# Patient Record
Sex: Female | Born: 1935
Health system: Southern US, Community
[De-identification: ages and names within clinical notes are randomized; demographics above are authoritative.]

## PROBLEM LIST (undated history)

## (undated) DIAGNOSIS — K219 Gastro-esophageal reflux disease without esophagitis: Secondary | ICD-10-CM

## (undated) DIAGNOSIS — R32 Unspecified urinary incontinence: Secondary | ICD-10-CM

## (undated) DIAGNOSIS — B029 Zoster without complications: Secondary | ICD-10-CM

## (undated) DIAGNOSIS — K589 Irritable bowel syndrome without diarrhea: Secondary | ICD-10-CM

## (undated) DIAGNOSIS — R42 Dizziness and giddiness: Secondary | ICD-10-CM

## (undated) DIAGNOSIS — N182 Chronic kidney disease, stage 2 (mild): Secondary | ICD-10-CM

## (undated) DIAGNOSIS — K5790 Diverticulosis of intestine, part unspecified, without perforation or abscess without bleeding: Secondary | ICD-10-CM

## (undated) DIAGNOSIS — R079 Chest pain, unspecified: Secondary | ICD-10-CM

## (undated) DIAGNOSIS — E039 Hypothyroidism, unspecified: Secondary | ICD-10-CM

## (undated) DIAGNOSIS — S2231XA Fracture of one rib, right side, initial encounter for closed fracture: Secondary | ICD-10-CM

## (undated) DIAGNOSIS — F419 Anxiety disorder, unspecified: Secondary | ICD-10-CM

## (undated) DIAGNOSIS — E782 Mixed hyperlipidemia: Secondary | ICD-10-CM

## (undated) DIAGNOSIS — IMO0002 Reserved for concepts with insufficient information to code with codable children: Secondary | ICD-10-CM

## (undated) DIAGNOSIS — G5760 Lesion of plantar nerve, unspecified lower limb: Secondary | ICD-10-CM

## (undated) DIAGNOSIS — M5416 Radiculopathy, lumbar region: Secondary | ICD-10-CM

## (undated) HISTORY — DX: Irritable bowel syndrome, unspecified: K58.9

## (undated) HISTORY — DX: Dizziness and giddiness: R42

## (undated) HISTORY — DX: Gastro-esophageal reflux disease without esophagitis: K21.9

## (undated) HISTORY — DX: Zoster without complications: B02.9

## (undated) HISTORY — PX: WISDOM TOOTH EXTRACTION: SHX21

## (undated) HISTORY — DX: Chronic kidney disease, stage 2 (mild): N18.2

## (undated) HISTORY — DX: Mixed hyperlipidemia: E78.2

## (undated) HISTORY — DX: Fracture of one rib, right side, initial encounter for closed fracture: S22.31XA

## (undated) HISTORY — PX: TONSILLECTOMY: SUR1361

## (undated) HISTORY — DX: Chest pain, unspecified: R07.9

## (undated) HISTORY — DX: Hypothyroidism, unspecified: E03.9

## (undated) HISTORY — DX: Anxiety disorder, unspecified: F41.9

## (undated) HISTORY — DX: Diverticulosis of intestine, part unspecified, without perforation or abscess without bleeding: K57.90

## (undated) HISTORY — DX: Radiculopathy, lumbar region: M54.16

## (undated) HISTORY — DX: Reserved for concepts with insufficient information to code with codable children: IMO0002

## (undated) HISTORY — DX: Lesion of plantar nerve, unspecified lower limb: G57.60

## (undated) HISTORY — DX: Unspecified urinary incontinence: R32

---
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
## 1972-06-25 HISTORY — PX: TUBAL LIGATION: SHX77

## 1992-06-25 HISTORY — PX: FOOT NEUROMA SURGERY: SHX646

## 1999-12-21 ENCOUNTER — Ambulatory Visit (HOSPITAL_COMMUNITY): Admission: RE | Admit: 1999-12-21 | Discharge: 1999-12-21 | Payer: Self-pay | Admitting: Gastroenterology

## 2001-05-15 ENCOUNTER — Other Ambulatory Visit: Admission: RE | Admit: 2001-05-15 | Discharge: 2001-05-15 | Payer: Self-pay | Admitting: Family Medicine

## 2003-06-26 HISTORY — PX: COLONOSCOPY: SHX174

## 2003-06-26 LAB — HM COLONOSCOPY

## 2003-07-08 ENCOUNTER — Other Ambulatory Visit: Admission: RE | Admit: 2003-07-08 | Discharge: 2003-07-08 | Payer: Self-pay | Admitting: Family Medicine

## 2005-08-27 ENCOUNTER — Other Ambulatory Visit: Admission: RE | Admit: 2005-08-27 | Discharge: 2005-08-27 | Payer: Self-pay | Admitting: Family Medicine

## 2009-11-21 ENCOUNTER — Emergency Department (HOSPITAL_COMMUNITY): Admission: EM | Admit: 2009-11-21 | Discharge: 2009-11-21 | Payer: Self-pay | Admitting: Emergency Medicine

## 2010-01-15 ENCOUNTER — Emergency Department (HOSPITAL_COMMUNITY): Admission: EM | Admit: 2010-01-15 | Discharge: 2010-01-15 | Payer: Self-pay | Admitting: Emergency Medicine

## 2010-01-22 ENCOUNTER — Emergency Department (HOSPITAL_COMMUNITY): Admission: EM | Admit: 2010-01-22 | Discharge: 2010-01-22 | Payer: Self-pay | Admitting: Family Medicine

## 2010-03-02 ENCOUNTER — Ambulatory Visit: Payer: Self-pay | Admitting: Gastroenterology

## 2010-03-02 DIAGNOSIS — K589 Irritable bowel syndrome without diarrhea: Secondary | ICD-10-CM | POA: Insufficient documentation

## 2010-06-25 HISTORY — PX: CATARACT EXTRACTION: SUR2

## 2010-07-25 NOTE — Assessment & Plan Note (Signed)
Summary: DIVERTICULOSIS/ ?RECURRENT DIVERTICULITIS, IBS-M   Visit Type:  Initial Consult Primary Care Provider:  Milinda Cave, M.D.  Chief Complaint:  diverticulitis.  History of Present Illness: Last episode of MAY 2011. Doesn't handle Flagyl-constipates her, severe nausea. Only took Flagyl for 4 days and took complete course of CIP. Went to ED JUL 2011and given CIP/FLAG.  Sx: constipation, pain in LLQ, bloating, can't pass gas, Tm 31F. TCS at [facility] one 2005. No nausea, vomiting, blood in stool, black stools, problems swallowing. 100% Sx relief with Nexium. Several episode of diarrhea this AM-crampy and rectal urgency.  Was seeing Dr. Gwinda Passe but wants to establish care closer to home.  Preventive Screening-Counseling & Management  Alcohol-Tobacco     Smoking Status: quit  Current Medications (verified): 1)  Aspirin 81 Mg Tbec (Aspirin) .... Once Daily 2)  Levoxyl 100 Mcg Tabs (Levothyroxine Sodium) .... Once Daily 3)  Lipitor 10 Mg Tabs (Atorvastatin Calcium) .... Once Daily 4)  Lorazepam 0.5 Mg Tabs (Lorazepam) .... Once Daily 5)  Nexium 40 Mg Cpdr (Esomeprazole Magnesium) .... Once Daily 6)  Detrol La 4 Mg Xr24h-Cap (Tolterodine Tartrate) .... Once Daily 7)  Amitiza 8 Mcg Caps (Lubiprostone) .... Two Times A Day 8)  Cipro 500 Mg Tabs (Ciprofloxacin Hcl) .... As Needed Divericulitis 9)  Meclizine Hcl 25 Mg Tabs (Meclizine Hcl) .... As Needed 10)  Promethazine Hcl 25 Mg Tabs (Promethazine Hcl) .... As Needed  Allergies (verified): No Known Drug Allergies  Past History:  Past Medical History: Diverticulosis: L COLON Reported recurrent Diverticulitis SINCE THE 80S ?-NO ABD IMAGING IBS-diarrhea/constipation GERD for several years **EGD 2008: SML HH, NERD, F50 V5 RECTAL BLEEDING/DIARRHEA **TCS 2005: MILD ulcerative proctitis, NORML RANDOM COLON BX, F75 V7 TCS 2001: poor prep in right colon, [place] DIVERTICULOSIS Hyperlipidemia Hypertension Hypothyroidism Vertigo 2010 Anxiety  Disorder  Past Surgical History: Tonsillectomy Tubal Ligation  Family History: No FH of Colon Cancer FATHER: ETOH ABUSE  Social History: Divorced, retired Engineer, 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 Quit 1985 Alcohol Use - no Smoking Status:  quit  Review of Systems       2005: 168 LBS  APR 2011: HB 14.4 PLT 255 CR 0.98 TBILI 0.6 ALK PHOS 97AST 18 ALT 14 ALB 4.3 TSH 0.430  Vital Signs:  Patient profile:   77 year old female Height:      66 inches Weight:      169 pounds BMI:     27.38 Temp:     97.9 degrees F oral Pulse rate:   84 / minute BP sitting:   130 / 80  (right arm) Cuff size:   regular  Vitals Entered By: Hendricks Limes LPN (March 02, 2010 9:06 AM)  Physical Exam  General:  Well developed, well nourished, no acute distress. Head:  Normocephalic and atraumatic. Eyes:  PERRL, no icterus. Mouth:  No deformity or lesions. Neck:  Supple; no masses. Lungs:  Clear throughout to auscultation. Heart:  Regular rate and rhythm; no murmurs. Abdomen:  Soft, mild TTP in BUQ/EPIGASTRIUM, nondistended.  Normal bowel sounds. Extremities:  No edema noted. Neurologic:  Alert and  oriented x4;  grossly normal neurologically.  Impression & Recommendations:  Problem # 1:  ABDOMINAL PAIN, LEFT LOWER QUADRANT (ICD-789.04) Assessment Unchanged Most likely 2o to functional abd pain or IBS-mixed pattern. No pain today. Needs imaging prior to next course of Abx. Call if you think you are having a bout of diverticulitis. Will get a CT Scan of your abdomen. Will use Augmentin to  treat next episode of diverticulitis.  Problem # 2:  IRRITABLE BOWEL SYNDROME (ICD-564.1) Continue Amitiza. Monitor stool freqency. Follow up in 6 mos. Will get colonoscopy and radiology reports from [facility].  CC: PCP  Patient Instructions: 1)  Call if you think you are having a bout of diverticulitis. 2)  Will get a CT Scan of you abdomen with next bout of pain. 3)  Will use  Augmentin to treat  next episode of diverticulitis. 4)  Continue Amitiza. 5)  Follow up in 6 mos. 6)  Will get colonoscopy and radiology rports from [facility]. 7)  The medication list was reviewed and reconciled.  All changed / newly prescribed medications were explained.  A complete medication list was provided to the patient / caregiver.  Appended Document: DIVERTICULOSIS/ ?RECURRENT DIVERTICULITIS, IBS-M 6 MONTH F/U OPV IS IN THE COMPUTER  Appended Document: Orders Update    Clinical Lists Changes  Orders: Added new Service order of New Patient Level IV (16109) - Signed

## 2010-08-16 ENCOUNTER — Encounter (INDEPENDENT_AMBULATORY_CARE_PROVIDER_SITE_OTHER): Payer: Self-pay | Admitting: *Deleted

## 2010-08-22 NOTE — Letter (Signed)
Summary: Recall Office Visit  [facility]  [address]   [place], [place] [address]   Phone: [phone]  Fax: [phone]      August 16, 2010   TAKIMA ENCINA [address] [place], [place]  [address] 10/28/1933   Dear Ms. Hillhouse,   According to our records, it is time for you to schedule a follow-up office visit with [place].   At your convenience, please call [phone] to schedule an office visit. If you have any questions, concerns, or feel that this letter is in error, we would appreciate your call.   Sincerely,    Diana Eves  [facility] Ph: [phone]   Fax: [phone]

## 2010-11-10 NOTE — Procedures (Signed)
[facility]  Patient:    Julie Park, Julie Park                     MRN: [id number] Proc. Date: 12/21/99 Adm. Date:  [id number] Disc. Date: [id number] Attending:  Deneen Harts CC:         Monica Becton, M.D.                           Procedure Report  PROCEDURE:  Colonoscopy.  INDICATION FOR PROCEDURE:  A 77 year old white female undergoing colonoscopy for neoplasia surveillance and to further evaluate recurrent episodes of acute diverticulitis. Last examined 4 years ago at which time, diverticular disease was noted during the interim 4 years, recurrent episodes requiring Cipro therapy.  DESCRIPTION OF PROCEDURE:  After reviewing the nature of the procedure with the patient including potential risks and complications, and after discussing alternative methods of diagnosis and treatment, informed consent was signed.  The patient was premedicated receiving IV sedation totaling Versed 10 mg, fentanyl 100 mcg administered IV in divided doses prior to and during the course of the procedure.  Using an Olympus pediatric PCF-140L video colonoscope, the rectum was intubated after a normal digital examination. The scope was inserted and advanced around the entire length of the colon to the cecum identified by the appendiceal orifice and ileocecal valve. Intubation was slightly difficult through a very diverticular laden sigmoid colon. Preparation was poor in the right colon, fair to good throughout the remainder.  The scope was slowly withdrawn with careful inspection of the entire colon in a retrograde manner including retroflexed view in the rectal vault.  The only abnormality noted was extensive sigmoid diverticulosis. No evidence of acute infection or inflammation. No neoplasia. Mucosa intact throughout. The colon was decompressed, scope withdrawn.  The patient tolerated the procedure without difficulty being maintained on DataScope monitor low  flow oxygen throughout.  Time 1, technical 1, preparation 3, total score equal 5.  ASSESSMENT: 1. Diverticulosis--extensive sigmoid involvement. No acute inflammation. 2. No colorectal neoplasia.  RECOMMENDATION: 1. Annual Hemoccult. 2. Repeat colonoscopy in 10 years. 3. Symptomatic treatment of diverticulitis when indicated. DD:  12/21/99 TD:  12/23/99 Job: [id number]

## 2012-02-24 LAB — HM PAP SMEAR

## 2012-02-24 LAB — HM MAMMOGRAPHY

## 2012-05-14 ENCOUNTER — Ambulatory Visit: Payer: Self-pay | Admitting: Family Medicine

## 2012-05-16 ENCOUNTER — Ambulatory Visit (INDEPENDENT_AMBULATORY_CARE_PROVIDER_SITE_OTHER): Payer: Medicare Other | Admitting: Family Medicine

## 2012-05-16 ENCOUNTER — Encounter: Payer: Self-pay | Admitting: Family Medicine

## 2012-05-16 VITALS — BP 133/85 | HR 91 | Temp 97.0°F | Ht 65.0 in | Wt 176.1 lb

## 2012-05-16 DIAGNOSIS — IMO0002 Reserved for concepts with insufficient information to code with codable children: Secondary | ICD-10-CM

## 2012-05-16 DIAGNOSIS — K589 Irritable bowel syndrome without diarrhea: Secondary | ICD-10-CM

## 2012-05-16 DIAGNOSIS — E782 Mixed hyperlipidemia: Secondary | ICD-10-CM | POA: Insufficient documentation

## 2012-05-16 DIAGNOSIS — E785 Hyperlipidemia, unspecified: Secondary | ICD-10-CM | POA: Insufficient documentation

## 2012-05-16 DIAGNOSIS — E039 Hypothyroidism, unspecified: Secondary | ICD-10-CM

## 2012-05-16 DIAGNOSIS — K5790 Diverticulosis of intestine, part unspecified, without perforation or abscess without bleeding: Secondary | ICD-10-CM

## 2012-05-16 DIAGNOSIS — L98499 Non-pressure chronic ulcer of skin of other sites with unspecified severity: Secondary | ICD-10-CM

## 2012-05-16 DIAGNOSIS — K219 Gastro-esophageal reflux disease without esophagitis: Secondary | ICD-10-CM | POA: Insufficient documentation

## 2012-05-16 DIAGNOSIS — K5730 Diverticulosis of large intestine without perforation or abscess without bleeding: Secondary | ICD-10-CM

## 2012-05-16 DIAGNOSIS — F419 Anxiety disorder, unspecified: Secondary | ICD-10-CM

## 2012-05-16 DIAGNOSIS — R32 Unspecified urinary incontinence: Secondary | ICD-10-CM

## 2012-05-16 DIAGNOSIS — F411 Generalized anxiety disorder: Secondary | ICD-10-CM

## 2012-05-16 MED ORDER — SOLIFENACIN SUCCINATE 5 MG PO TABS: 5.0000 mg | ORAL_TABLET | Freq: Every day | ORAL | Status: DC

## 2012-05-16 NOTE — Progress Notes (Signed)
Office Note 05/16/2012  CC:  Chief Complaint  Patient presents with  . Establish Care    new patient    HPI:  Julie Park is a 78 y.o. White female who is here to establish care. Patient's most recent primary MD: Dr. Milford Cage at [facility] in [place].  GYN: Dr. Mora Appl in [place]. Had pap and mammo last month and these were normal per her report today. Old records were not reviewed prior to or during today's visit.  No acute complaints.   She last saw Dr. Milford Cage for f/u a couple of months ago.  Has had some GI hx--diverticular disease-- she keeps cipro on hand in case she feels like diverticulitis is starting.  She hasn't had problem with this for a "long time" and says amitiza and metamucil help her bowel habits immensely. She does says he is interested in trying a different med for her urinary incontinence b/c ditropan not helping much and it is very expensive. She is not taking any calcium or vitamin D.  She reports having a bone density scan in the last 5 yrs or so and was told it was normal.    Past Medical History  Diagnosis Date  . Hyperlipidemia   . Diverticulitis 1982  . Vertigo   . Anxiety   . GERD (gastroesophageal reflux disease)   . Thyroid disease   . Urine incontinence   . Ulcer   . Shingles 1967 and 2010    Past Surgical History  Procedure Date  . Tubal ligation 1974  . Tonsillectomy and adenoidectomy 1948  . Foot neuroma surgery 1994    right foot  . Wisdom tooth extraction   . Colonoscopy 2005    Dr. Gwinda Passe at [facility] (normal per pt report)  . Cataract extraction 2012    Dr. Elmer Picker    Family History  Problem Relation Age of Onset  . Cancer Father     pancreatic  . Asthma Daughter   . Heart attack Paternal Grandfather     History   Social History  . Marital Status: Divorced    Spouse Name: N/A    Number of Children: N/A  . Years of Education: N/A   Occupational History  . Not on file.   Social History Main Topics  . Smoking status: Former  Smoker -- 1.0 packs/day for 8 years    Types: Cigarettes    Start date: 06/26/1983  . Smokeless tobacco: Never Used  . Alcohol Use: No  . Drug Use: No  . Sexually Active: Not Currently   Other Topics Concern  . 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  Alcohol: none.      Outpatient Encounter Prescriptions as of 05/16/2012  Medication Sig Dispense Refill  . aspirin 81 MG tablet Take 81 mg by mouth daily.      Marland Kitchen atorvastatin (LIPITOR) 10 MG tablet Take 10 mg by mouth daily.      Marland Kitchen esomeprazole (NEXIUM) 40 MG capsule Take 40 mg by mouth daily before breakfast.      . levothyroxine (SYNTHROID, LEVOTHROID) 88 MCG tablet Take 88 mcg by mouth daily.      Marland Kitchen LORazepam (ATIVAN) 1 MG tablet Take 1 mg by mouth every 8 (eight) hours.      Marland Kitchen lubiprostone (AMITIZA) 8 MCG capsule Take 8 mcg by mouth 2 (two) times  daily with a meal.      . tolterodine (DETROL LA) 4 MG 24 hr capsule Take 4 mg by mouth daily.      . ciprofloxacin (CIPRO) 500 MG tablet Take 500 mg by mouth as needed.      . meclizine (ANTIVERT) 25 MG tablet Take 25 mg by mouth as needed.      . promethazine (PHENERGAN) 25 MG tablet Take 25 mg by mouth as needed.      . solifenacin (VESICARE) 5 MG tablet Take 1 tablet (5 mg total) by mouth daily.  30 tablet  6    No Known Allergies  ROS Review of Systems  Constitutional: Negative for fever and fatigue.  HENT: Negative for congestion and sore throat.   Eyes: Negative for visual disturbance.  Respiratory: Negative for cough.   Cardiovascular: Negative for chest pain.  Gastrointestinal: Negative for nausea and abdominal pain.  Genitourinary: Negative for dysuria.  Musculoskeletal: Negative for back pain and joint swelling.  Skin: Negative for rash.  Neurological: Negative for weakness and  headaches.  Hematological: Negative for adenopathy.    PE; Blood pressure 133/85, pulse 91, temperature 97 F (36.1 C), temperature source Temporal, height 5\' 5"  (1.651 m), weight 176 lb 1.9 oz (79.888 kg), SpO2 95.00%. Gen: Alert, well appearing.  Patient is oriented to person, place, time, and situation. ENT: Eyes: no injection, icteris, swelling, or exudate.  EOMI, PERRLA. Nose: no drainage or turbinate edema/swelling.  No injection or focal lesion.  Mouth: lips without lesion/swelling.  Oral mucosa pink and moist.  Dentition intact and without obvious caries or gingival swelling.  Oropharynx without erythema, exudate, or swelling.  Neck - No masses or thyromegaly or limitation in range of motion CV: RRR, no m/r/g.   LUNGS: CTA bilat, nonlabored resps, good aeration in all lung fields. ABD: soft, NT, ND, BS normal.  No hepatospenomegaly or mass.  No bruits. EXT: no clubbing, cyanosis, or edema.   Pertinent labs:  None today  ASSESSMENT AND PLAN:   Transfer pt from [facility] in [place]: obtain old records.  Urine incontinence D/c ditropan. Start vesicare 5mg  qhs, increase to 10mg  qhs after 7-10d if not getting desired effect. Therapeutic expectations and side effect profile of medication discussed today.  Patient's questions answered.   Hypothyroidism Continue current synthroid dosing. Obtain recent labs via old records from PCP. Plan to recheck TSH at next f/u in 48mo.  Diverticular disease Quiescent as of late.  Anxiety Problem stable.  Continue current medications and diet appropriate for this condition.  We have reviewed our general long term plan for this problem and also reviewed symptoms and signs that should prompt the patient to call or return to the office.   IRRITABLE BOWEL SYNDROME Constipation predominant. Doing well on metamucil and amitiza.    Return in about 4 months (around 09/13/2012) for f/u urinary incont and hypothyr.

## 2012-05-16 NOTE — Assessment & Plan Note (Addendum)
D/c ditropan. Start vesicare 5mg  qhs, increase to 10mg  qhs after 7-10d if not getting desired effect. Therapeutic expectations and side effect profile of medication discussed today.  Patient's questions answered.

## 2012-05-16 NOTE — Assessment & Plan Note (Signed)
Problem stable.  Continue current medications and diet appropriate for this condition.  We have reviewed our general long term plan for this problem and also reviewed symptoms and signs that should prompt the patient to call or return to the office.  

## 2012-05-16 NOTE — Assessment & Plan Note (Signed)
Continue current synthroid dosing. Obtain recent labs via old records from PCP. Plan to recheck TSH at next f/u in 55mo.

## 2012-05-16 NOTE — Assessment & Plan Note (Signed)
Quiescent as of late.

## 2012-05-16 NOTE — Assessment & Plan Note (Signed)
>>  ASSESSMENT AND PLAN FOR ANXIETY WRITTEN ON 05/16/2012  5:22 PM BY MCGOWEN, PHILIP H, MD  Problem stable.  Continue current medications and diet appropriate for this condition.  We have reviewed our general long term plan for this problem and also reviewed symptoms and signs that should prompt the patient to call or return to the office.

## 2012-05-16 NOTE — Assessment & Plan Note (Signed)
Constipation predominant. Doing well on metamucil and amitiza.

## 2012-09-18 ENCOUNTER — Encounter: Payer: Self-pay | Admitting: Family Medicine

## 2012-09-18 ENCOUNTER — Ambulatory Visit (INDEPENDENT_AMBULATORY_CARE_PROVIDER_SITE_OTHER): Payer: Medicare Other | Admitting: Family Medicine

## 2012-09-18 VITALS — BP 146/74 | HR 87 | Temp 97.8°F | Resp 14 | Wt 177.5 lb

## 2012-09-18 DIAGNOSIS — E782 Mixed hyperlipidemia: Secondary | ICD-10-CM

## 2012-09-18 DIAGNOSIS — E039 Hypothyroidism, unspecified: Secondary | ICD-10-CM

## 2012-09-18 DIAGNOSIS — R32 Unspecified urinary incontinence: Secondary | ICD-10-CM

## 2012-09-18 DIAGNOSIS — E785 Hyperlipidemia, unspecified: Secondary | ICD-10-CM

## 2012-09-18 LAB — COMPREHENSIVE METABOLIC PANEL
ALT: 18 U/L (ref 0–35)
CO2: 28 mEq/L (ref 19–32)
Calcium: 8.9 mg/dL (ref 8.4–10.5)
Chloride: 100 mEq/L (ref 96–112)
Creatinine, Ser: 1 mg/dL (ref 0.4–1.2)
GFR: 59.89 mL/min — ABNORMAL LOW (ref >60.00)
Glucose, Bld: 96 mg/dL (ref 70–99)
Total Bilirubin: 0.6 mg/dL (ref 0.3–1.2)
Total Protein: 6.7 g/dL (ref 6.0–8.3)

## 2012-09-18 LAB — LIPID PANEL
HDL: 43.6 mg/dL (ref >39.00)
Total CHOL/HDL Ratio: 3
Triglycerides: 89 mg/dL (ref 0.0–149.0)

## 2012-09-18 LAB — TSH: TSH: 1.96 u[IU]/mL (ref 0.35–5.50)

## 2012-09-18 MED ORDER — LORAZEPAM 1 MG PO TABS: 1.0000 mg | ORAL_TABLET | Freq: Three times a day (TID) | ORAL | Status: DC

## 2012-09-18 MED ORDER — ZOSTER VACCINE LIVE 19400 UNT/0.65ML ~~LOC~~ SOLR: 0.6500 mL | Freq: Once | SUBCUTANEOUS | Status: DC

## 2012-09-18 NOTE — Progress Notes (Signed)
OFFICE NOTE  09/18/2012  CC:  Chief Complaint  Patient presents with  . Follow-up    4 months     HPI: Patient is a 79 y.o. Caucasian female who is here for routine 6mo f/u hypoth, hyperlipidemia, urge incont. Feeling well.  No acute complaints.  Compliant with all meds--has just switched over to vesicare from [facility] and says it is doing fine so far.  Asks for rf on her lorazepam, takes 1 bid.   ROS: no CP, no SOB, no palpitations, no tremor, no fatigue.  No dysuria or hematuria.  No dizziness.  Pertinent PMH:  Past Medical History  Diagnosis Date  . Hyperlipidemia, mixed   . Diverticulosis     Diverticulitis (1982)  . Vertigo   . Anxiety   . GERD (gastroesophageal reflux disease)   . Hypothyroidism   . Urine incontinence   . Ulcer   . Shingles 1967 and 2010  . IBS (irritable bowel syndrome)     MEDS:  Outpatient Prescriptions Prior to Visit  Medication Sig Dispense Refill  . aspirin 81 MG tablet Take 81 mg by mouth daily.      Marland Kitchen atorvastatin (LIPITOR) 10 MG tablet Take 10 mg by mouth daily.      . ciprofloxacin (CIPRO) 500 MG tablet Take 500 mg by mouth as needed.      Marland Kitchen esomeprazole (NEXIUM) 40 MG capsule Take 40 mg by mouth daily before breakfast.      . levothyroxine (SYNTHROID, LEVOTHROID) 88 MCG tablet Take 88 mcg by mouth daily.      Marland Kitchen lubiprostone (AMITIZA) 8 MCG capsule Take 8 mcg by mouth 2 (two) times daily with a meal.      . meclizine (ANTIVERT) 25 MG tablet Take 25 mg by mouth as needed.      . promethazine (PHENERGAN) 25 MG tablet Take 25 mg by mouth as needed.      . solifenacin (VESICARE) 5 MG tablet Take 1 tablet (5 mg total) by mouth daily.  30 tablet  6  . LORazepam (ATIVAN) 1 MG tablet Take 1 mg by mouth every 8 (eight) hours.      Marland Kitchen tolterodine (DETROL LA) 4 MG 24 hr capsule Take 4 mg by mouth daily.       No facility-administered medications prior to visit.    PE: Blood pressure 146/74, pulse 87, temperature 97.8 F (36.6 C), temperature  source Temporal, resp. rate 14, weight 177 lb 8 oz (80.513 kg), SpO2 96.00%.  bP repeat in exam room 136/76 Gen: Alert, well appearing.  Patient is oriented to person, place, time, and situation. ENT: Ears: EACs clear, normal epithelium.  TMs with good light reflex and landmarks bilaterally.  Eyes: no injection, icteris, swelling, or exudate.  EOMI, PERRLA. Nose: no drainage or turbinate edema/swelling.  No injection or focal lesion.  Mouth: lips without lesion/swelling.  Oral mucosa pink and moist.  Dentition intact and without obvious caries or gingival swelling.  Oropharynx without erythema, exudate, or swelling.  Neck - No masses or thyromegaly or limitation in range of motion CV: RRR, no m/r/g.   LUNGS: CTA bilat, nonlabored resps, good aeration in all lung fields.   IMPRESSION AND PLAN:  Hyperlipidemia Continue statin. Check FLP and transaminases today.  Hypothyroidism Continue 88 mcg levothyroxine qd. Check TSH today.  Urine incontinence Stable. Continue vesicare--rx given today.   An After Visit Summary was printed and given to the patient.  FOLLOW UP:  4-6 mo routine f/u

## 2012-09-20 NOTE — Assessment & Plan Note (Signed)
Continue 88 mcg levothyroxine qd. Check TSH today.

## 2012-09-20 NOTE — Assessment & Plan Note (Signed)
Stable. Continue vesicare--rx given today.

## 2012-09-20 NOTE — Assessment & Plan Note (Signed)
Continue statin. Check FLP and transaminases today.

## 2013-01-28 ENCOUNTER — Other Ambulatory Visit: Payer: Self-pay

## 2013-03-02 ENCOUNTER — Telehealth: Payer: Self-pay | Admitting: Family Medicine

## 2013-03-02 MED ORDER — ESOMEPRAZOLE MAGNESIUM 40 MG PO CPDR: 40.0000 mg | DELAYED_RELEASE_CAPSULE | Freq: Every day | ORAL | Status: DC

## 2013-03-02 MED ORDER — LORAZEPAM 1 MG PO TABS: 1.0000 mg | ORAL_TABLET | Freq: Three times a day (TID) | ORAL | Status: DC

## 2013-03-02 NOTE — Telephone Encounter (Signed)
Patient requesting ativan refill.  Patient last seen 09/18/12 and got rx x 5 refills.  Patient next OV is 03/20/13.  Please advise.

## 2013-03-02 NOTE — Telephone Encounter (Signed)
OK to RF as previously prescribed, with 5 additional RF's.  She has scheduled f/u this month and is a reliable/compliant patient.-thx

## 2013-03-20 ENCOUNTER — Encounter: Payer: Self-pay | Admitting: Family Medicine

## 2013-03-20 ENCOUNTER — Ambulatory Visit (INDEPENDENT_AMBULATORY_CARE_PROVIDER_SITE_OTHER): Payer: Medicare Other | Admitting: Family Medicine

## 2013-03-20 VITALS — BP 122/68 | HR 95 | Temp 97.8°F | Resp 16 | Ht 65.0 in | Wt 175.0 lb

## 2013-03-20 DIAGNOSIS — F419 Anxiety disorder, unspecified: Secondary | ICD-10-CM

## 2013-03-20 DIAGNOSIS — Z23 Encounter for immunization: Secondary | ICD-10-CM

## 2013-03-20 DIAGNOSIS — K5730 Diverticulosis of large intestine without perforation or abscess without bleeding: Secondary | ICD-10-CM

## 2013-03-20 DIAGNOSIS — R32 Unspecified urinary incontinence: Secondary | ICD-10-CM

## 2013-03-20 DIAGNOSIS — K5790 Diverticulosis of intestine, part unspecified, without perforation or abscess without bleeding: Secondary | ICD-10-CM

## 2013-03-20 DIAGNOSIS — E039 Hypothyroidism, unspecified: Secondary | ICD-10-CM

## 2013-03-20 DIAGNOSIS — F411 Generalized anxiety disorder: Secondary | ICD-10-CM

## 2013-03-20 DIAGNOSIS — E785 Hyperlipidemia, unspecified: Secondary | ICD-10-CM

## 2013-03-20 MED ORDER — SOLIFENACIN SUCCINATE 5 MG PO TABS: 5.0000 mg | ORAL_TABLET | Freq: Every day | ORAL | Status: DC

## 2013-03-20 NOTE — Assessment & Plan Note (Signed)
Quiescent

## 2013-03-20 NOTE — Progress Notes (Addendum)
OFFICE NOTE  03/20/2013  CC:  Chief Complaint  Patient presents with  . Follow-up     HPI: Patient is a 79 y.o. Caucasian female who is here for 6 mo f/u hypothyroidism, hyperlip, and anxiety. Compliant with meds.  Has been very active with yardwork this summer and denies any CP or SOB. Takes ativan bid for anxiety. Still with urge incont and nocturia on vesicare 5mg  qd--it helps but still has sx's.   Pertinent PMH:  Past Medical History  Diagnosis Date  . Hyperlipidemia, mixed   . Diverticulosis     Diverticulitis (1982)  . Vertigo   . Anxiety   . GERD (gastroesophageal reflux disease)   . Hypothyroidism   . Urine incontinence   . Ulcer   . Shingles 1967 and 2010  . IBS (irritable bowel syndrome)   . Right lumbar radiculopathy     Intermittent x 2 episodes in summer 2014   Past Surgical History  Procedure Laterality Date  . Tubal ligation  1974  . Tonsillectomy and adenoidectomy  1948  . Foot neuroma surgery  1994    right foot  . Wisdom tooth extraction    . Colonoscopy  2005    [facility] (normal per pt report)  . Cataract extraction  2012    Dr. Elmer Picker    MEDS:  Outpatient Prescriptions Prior to Visit  Medication Sig Dispense Refill  . aspirin 81 MG tablet Take 81 mg by mouth daily.      Marland Kitchen atorvastatin (LIPITOR) 10 MG tablet Take 10 mg by mouth daily.      . ciprofloxacin (CIPRO) 500 MG tablet Take 500 mg by mouth as needed.      Marland Kitchen esomeprazole (NEXIUM) 40 MG capsule Take 1 capsule (40 mg total) by mouth daily before breakfast.  90 capsule  1  . levothyroxine (SYNTHROID, LEVOTHROID) 88 MCG tablet Take 88 mcg by mouth daily.      Marland Kitchen LORazepam (ATIVAN) 1 MG tablet Take 1 tablet (1 mg total) by mouth every 8 (eight) hours.  60 tablet  5  . lubiprostone (AMITIZA) 8 MCG capsule Take 8 mcg by mouth 2 (two) times daily with a meal.      . meclizine (ANTIVERT) 25 MG tablet Take 25 mg by mouth as needed.      . promethazine (PHENERGAN) 25 MG tablet Take 25  mg by mouth as needed.      . solifenacin (VESICARE) 5 MG tablet Take 1 tablet (5 mg total) by mouth daily.  30 tablet  6  . zoster vaccine live, PF, (ZOSTAVAX) 96045 UNT/0.65ML injection Inject 19,400 Units into the skin once.  1 vial  0   No facility-administered medications prior to visit.    PE: Blood pressure 122/68, pulse 95, temperature 97.8 F (36.6 C), temperature source Temporal, resp. rate 16, height 5\' 5"  (1.651 m), weight 175 lb (79.379 kg), SpO2 95.00%. Gen: Alert, well appearing.  Patient is oriented to person, place, time, and situation. ENT:  Eyes: no injection, icteris, swelling, or exudate.  EOMI, PERRLA. Nose: no drainage or turbinate edema/swelling.  No injection or focal lesion.  Mouth: lips without lesion/swelling.  Oral mucosa pink and moist.  Oropharynx without erythema, exudate, or swelling.  Neck: supple/nontender.  No LAD, mass, or TM.  Carotid pulses 2+ bilaterally, without bruits. CV: RRR, no m/r/g.   LUNGS: CTA bilat, nonlabored resps, good aeration in all lung fields. EXT: no clubbing, cyanosis, or edema.   LAB:  Lab Results  Component Value Date   TSH 1.96 09/18/2012   Lab Results  Component Value Date   CHOL 148 09/18/2012   HDL 43.60 09/18/2012   LDLCALC 87 09/18/2012   TRIG 89.0 09/18/2012   CHOLHDL 3 09/18/2012    IMPRESSION AND PLAN:  Hypothyroidism This has been stable. Will recheck TSH next o/v in 6 mo (annual TSH check).  Anxiety Doing well on current ativan bid.  Diverticular disease Quiescent.  Urine incontinence Vesicare 5mg  working as well as ditrapan had worked for her, but still has nocturia x 3-4. She has never tried the 10mg  dosing.   Encouraged her to try 10mg  qAM dosing and see if this makes a difference: if so, she'll notify us and we'll do her next rx for the 10mg  vesicare tab.  Hyperlipidemia Stable. Continue statin. Recheck FLP at next o/v in 6 mo.   Pneumovax given today.   We deferred Tdap until next f/u  visit.  An After Visit Summary was printed and given to the patient.  FOLLOW UP: 6 mo

## 2013-03-20 NOTE — Assessment & Plan Note (Signed)
Doing well on current ativan bid.

## 2013-03-20 NOTE — Assessment & Plan Note (Signed)
Stable. Continue statin. Recheck FLP at next o/v in 6 mo.

## 2013-03-20 NOTE — Assessment & Plan Note (Addendum)
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 She has never tried the 10mg  dosing.   Encouraged her to try 10mg  qAM dosing and see if this makes a difference: if so, she'll notify us and we'll do her next rx for the 10mg  vesicare tab.

## 2013-03-20 NOTE — Assessment & Plan Note (Signed)
>>  ASSESSMENT AND PLAN FOR ANXIETY WRITTEN ON 03/20/2013 10:47 AM BY MCGOWEN, PHILIP H, MD  Doing well on current ativan  bid.

## 2013-03-20 NOTE — Assessment & Plan Note (Signed)
This has been stable. Will recheck TSH next o/v in 6 mo (annual TSH check).

## 2013-03-27 ENCOUNTER — Other Ambulatory Visit: Payer: Self-pay | Admitting: Family Medicine

## 2013-03-27 MED ORDER — LUBIPROSTONE 8 MCG PO CAPS: 8.0000 ug | ORAL_CAPSULE | Freq: Two times a day (BID) | ORAL | Status: DC

## 2013-04-06 ENCOUNTER — Other Ambulatory Visit: Payer: Self-pay | Admitting: Family Medicine

## 2013-04-06 MED ORDER — SOLIFENACIN SUCCINATE 5 MG PO TABS: 5.0000 mg | ORAL_TABLET | Freq: Every day | ORAL | Status: DC

## 2013-07-08 ENCOUNTER — Telehealth: Payer: Self-pay | Admitting: Family Medicine

## 2013-07-08 MED ORDER — LEVOTHYROXINE SODIUM 88 MCG PO TABS: 88.0000 ug | ORAL_TABLET | Freq: Every day | ORAL | Status: DC

## 2013-07-08 NOTE — Telephone Encounter (Signed)
Patient states pharmacy requested refill & gave patient 4 pills on Friday 07/03/13. Patient is out of med.

## 2013-07-13 ENCOUNTER — Encounter: Payer: Self-pay | Admitting: Family Medicine

## 2013-07-13 ENCOUNTER — Ambulatory Visit (INDEPENDENT_AMBULATORY_CARE_PROVIDER_SITE_OTHER): Payer: Medicare Other | Admitting: Family Medicine

## 2013-07-13 DIAGNOSIS — B9789 Other viral agents as the cause of diseases classified elsewhere: Secondary | ICD-10-CM

## 2013-07-13 DIAGNOSIS — J209 Acute bronchitis, unspecified: Secondary | ICD-10-CM

## 2013-07-13 DIAGNOSIS — B349 Viral infection, unspecified: Secondary | ICD-10-CM

## 2013-07-13 MED ORDER — HYDROCODONE-HOMATROPINE 5-1.5 MG/5ML PO SYRP: ORAL_SOLUTION | ORAL | Status: DC

## 2013-07-13 NOTE — Progress Notes (Signed)
OFFICE NOTE  07/13/2013  CC: Flu-like sx's   HPI: Patient is a 80 y.o. Caucasian female who is here for flu-like sx's. Onset 5 d/a, cough--dry, some nasal congestion and sneezing has followed, some HA but no body aches. Tm99, felt like more of a fever last night.  No ST, no rash, no SOB or wheezing. She took robitussin early in the course of things--no side effects.  Then last night took extra strength mucinex DM and had nausea without vomiting, some loose stools this morning.   However, today she feels quite a bit improved.  Of note, her daughter has been working in [place] in the [employer] there (not in Physicist, medical). Daughter returned 06/28/13 and has had no symptoms of illness and no fevers.  Pt went out to eat with daughter one day since her return.  I called infection prevention today to verify with them that my patient does not meet criteria for an "at risk" pt for having Ebola.  Infection prevention reassured me that since the daughter (person who traveled to [place]) has not had any symptoms then there is no reason to believe she would be contagious and therefore my patient is NOT AT RISK.  Pertinent PMH:  Past Medical History  Diagnosis Date  . Hyperlipidemia, mixed   . Diverticulosis     Diverticulitis (1982)  . Vertigo   . Anxiety   . GERD (gastroesophageal reflux disease)   . Hypothyroidism   . Urine incontinence   . Ulcer   . Shingles 1967 and 2010  . IBS (irritable bowel syndrome)   . Right lumbar radiculopathy     Intermittent x 2 episodes in summer 2014    MEDS:  Outpatient Prescriptions Prior to Visit  Medication Sig Dispense Refill  . aspirin 81 MG tablet Take 81 mg by mouth daily.      Marland Kitchen atorvastatin (LIPITOR) 10 MG tablet Take 10 mg by mouth daily.      Marland Kitchen esomeprazole (NEXIUM) 40 MG capsule Take 1 capsule (40 mg total) by mouth daily before breakfast.  90 capsule  1  . levothyroxine (SYNTHROID, LEVOTHROID) 88 MCG tablet Take 1 tablet (88 mcg total) by mouth  daily.  30 tablet  3  . LORazepam (ATIVAN) 1 MG tablet Take 1 tablet (1 mg total) by mouth every 8 (eight) hours.  60 tablet  5  . ciprofloxacin (CIPRO) 500 MG tablet Take 500 mg by mouth as needed.      . lubiprostone (AMITIZA) 8 MCG capsule Take 1 capsule (8 mcg total) by mouth 2 (two) times daily with a meal.  60 capsule  3  . meclizine (ANTIVERT) 25 MG tablet Take 25 mg by mouth as needed.      . promethazine (PHENERGAN) 25 MG tablet Take 25 mg by mouth as needed.      . solifenacin (VESICARE) 5 MG tablet Take 1 tablet (5 mg total) by mouth daily.  90 tablet  1   No facility-administered medications prior to visit.    PE: There were no vitals taken for this visit. Pt feels normal temperature to touch.  P 70  RR 14   Gen: alert, NAD, NONTOXIC APPEARING. HEENT: eyes without injection, drainage, or swelling.  Ears: EACs clear, TMs with normal light reflex and landmarks.  Nose: Clear rhinorrhea, with some dried, crusty exudate adherent to mildly injected mucosa.  No purulent d/c.  No paranasal sinus TTP.  No facial swelling.  Throat and mouth without focal lesion.  No pharyngial swelling, erythema, or exudate.   Neck: supple, no LAD.   LUNGS: CTA bilat, nonlabored resps.   CV: RRR, no m/r/g. EXT: no c/c/e SKIN: no rash  LAB: none  IMPRESSION AND PLAN:  Acute URI with bronchitis, no sign of RAD or bacterial infection. Reassured pt, discussed symptomatic care with robitussin DM daytime, rx for hycodan susp for hs use prn. Signs/symptoms to call or return for were reviewed and pt expressed understanding.  An After Visit Summary was printed and given to the patient.  Spent 30 min with pt today, with >50% of this time spent in counseling and care coordination regarding the above problems.  FOLLOW UP: prn

## 2013-07-15 ENCOUNTER — Telehealth: Payer: Self-pay | Admitting: Family Medicine

## 2013-07-15 MED ORDER — AMOXICILLIN 875 MG PO TABS: 875.0000 mg | ORAL_TABLET | Freq: Two times a day (BID) | ORAL | Status: DC

## 2013-07-15 NOTE — Telephone Encounter (Signed)
Patient called stating that her fever has subsided.  Patient states she's waking up very nauseated and she's been really battling a headache for the past two days.   Also, patient has a loss of appetite and feels very congested.  She was wondering if you could call something in or if we could recommend anything to help her head to start draining.  Please advise.

## 2013-07-15 NOTE — Telephone Encounter (Signed)
Pls call and let her know I sent in a rx for amoxicillin and I also recommend she use saline nasal spray several times per day if she is not already doing this.  Also, ask if she would like [place] to do a rx for phenergan for nausea.  If so, pls send rx for promethazine 12.5mg , 1 tab po q6h prn nausea, #20, no RF.-thx

## 2013-07-16 ENCOUNTER — Other Ambulatory Visit: Payer: Self-pay | Admitting: Family Medicine

## 2013-07-16 MED ORDER — PROMETHAZINE HCL 12.5 MG PO TABS: 12.5000 mg | ORAL_TABLET | Freq: Three times a day (TID) | ORAL | Status: DC | PRN

## 2013-07-16 MED ORDER — LUBIPROSTONE 8 MCG PO CAPS: 8.0000 ug | ORAL_CAPSULE | Freq: Two times a day (BID) | ORAL | Status: DC

## 2013-07-16 NOTE — Telephone Encounter (Signed)
Patient aware and would like the phenergan sent to pharmacy.   Medication sent.

## 2013-07-31 ENCOUNTER — Telehealth: Payer: Self-pay | Admitting: Family Medicine

## 2013-07-31 NOTE — Telephone Encounter (Signed)
Patient states that she is running out of her thyroid medication early and her pharmacy won't fill it but she also states that she does not take it more than she is supposed to.  I spoke with pharm tech at [facility] and the refills are available for her to pick up.

## 2013-08-12 ENCOUNTER — Other Ambulatory Visit: Payer: Self-pay | Admitting: Family Medicine

## 2013-08-12 NOTE — Telephone Encounter (Signed)
Patient requesting refill of ativan.  Patient last filled on 03/02/13 x 5 refills.  Patient last seen on 07/13/13.  Please advise refill.

## 2013-08-16 ENCOUNTER — Observation Stay (HOSPITAL_COMMUNITY)
Admission: EM | Admit: 2013-08-16 | Discharge: 2013-08-17 | Disposition: A | Discharge status: Home / Self Care | Payer: Medicare Other | Attending: Internal Medicine | Admitting: Internal Medicine

## 2013-08-16 ENCOUNTER — Encounter (HOSPITAL_COMMUNITY): Payer: Self-pay | Admitting: Emergency Medicine

## 2013-08-16 ENCOUNTER — Emergency Department (HOSPITAL_COMMUNITY): Payer: Medicare Other

## 2013-08-16 DIAGNOSIS — Z7982 Long term (current) use of aspirin: Secondary | ICD-10-CM | POA: Insufficient documentation

## 2013-08-16 DIAGNOSIS — K219 Gastro-esophageal reflux disease without esophagitis: Secondary | ICD-10-CM | POA: Diagnosis present

## 2013-08-16 DIAGNOSIS — S2231XA Fracture of one rib, right side, initial encounter for closed fracture: Secondary | ICD-10-CM | POA: Diagnosis present

## 2013-08-16 DIAGNOSIS — E782 Mixed hyperlipidemia: Secondary | ICD-10-CM | POA: Insufficient documentation

## 2013-08-16 DIAGNOSIS — E785 Hyperlipidemia, unspecified: Secondary | ICD-10-CM | POA: Diagnosis present

## 2013-08-16 DIAGNOSIS — K589 Irritable bowel syndrome without diarrhea: Secondary | ICD-10-CM | POA: Insufficient documentation

## 2013-08-16 DIAGNOSIS — S2249XA Multiple fractures of ribs, unspecified side, initial encounter for closed fracture: Principal | ICD-10-CM | POA: Diagnosis present

## 2013-08-16 DIAGNOSIS — W19XXXA Unspecified fall, initial encounter: Secondary | ICD-10-CM

## 2013-08-16 DIAGNOSIS — Z87891 Personal history of nicotine dependence: Secondary | ICD-10-CM | POA: Insufficient documentation

## 2013-08-16 DIAGNOSIS — Y93K1 Activity, walking an animal: Secondary | ICD-10-CM | POA: Insufficient documentation

## 2013-08-16 DIAGNOSIS — S2239XA Fracture of one rib, unspecified side, initial encounter for closed fracture: Secondary | ICD-10-CM

## 2013-08-16 DIAGNOSIS — K5730 Diverticulosis of large intestine without perforation or abscess without bleeding: Secondary | ICD-10-CM | POA: Insufficient documentation

## 2013-08-16 DIAGNOSIS — W108XXA Fall (on) (from) other stairs and steps, initial encounter: Secondary | ICD-10-CM | POA: Insufficient documentation

## 2013-08-16 DIAGNOSIS — Y92009 Unspecified place in unspecified non-institutional (private) residence as the place of occurrence of the external cause: Secondary | ICD-10-CM

## 2013-08-16 DIAGNOSIS — F411 Generalized anxiety disorder: Secondary | ICD-10-CM | POA: Diagnosis present

## 2013-08-16 DIAGNOSIS — E039 Hypothyroidism, unspecified: Secondary | ICD-10-CM | POA: Diagnosis present

## 2013-08-16 DIAGNOSIS — IMO0002 Reserved for concepts with insufficient information to code with codable children: Secondary | ICD-10-CM | POA: Insufficient documentation

## 2013-08-16 DIAGNOSIS — R32 Unspecified urinary incontinence: Secondary | ICD-10-CM | POA: Insufficient documentation

## 2013-08-16 HISTORY — DX: Fracture of one rib, right side, initial encounter for closed fracture: S22.31XA

## 2013-08-16 LAB — CBC
HCT: 45.2 % (ref 36.0–46.0)
Hemoglobin: 15.5 g/dL — ABNORMAL HIGH (ref 12.0–15.0)
MCH: 31.7 pg (ref 26.0–34.0)
MCHC: 34.3 g/dL (ref 30.0–36.0)
MCV: 92.4 fL (ref 78.0–100.0)
PLATELETS: 252 10*3/uL (ref 150–400)
RBC: 4.89 MIL/uL (ref 3.87–5.11)
RDW: 12.8 % (ref 11.5–15.5)
WBC: 14.4 10*3/uL — ABNORMAL HIGH (ref 4.0–10.5)

## 2013-08-16 LAB — BASIC METABOLIC PANEL
BUN: 8 mg/dL (ref 6–23)
CO2: 25 meq/L (ref 19–32)
Calcium: 9 mg/dL (ref 8.4–10.5)
Chloride: 99 mEq/L (ref 96–112)
Creatinine, Ser: 0.84 mg/dL (ref 0.50–1.10)
GFR calc Af Amer: 76 mL/min — ABNORMAL LOW (ref >90)
GFR calc non Af Amer: 65 mL/min — ABNORMAL LOW (ref >90)
Glucose, Bld: 116 mg/dL — ABNORMAL HIGH (ref 70–99)
Potassium: 4.2 mEq/L (ref 3.7–5.3)
SODIUM: 135 meq/L — AB (ref 137–147)

## 2013-08-16 MED ORDER — ONDANSETRON HCL 4 MG/2ML IJ SOLN
4.0000 mg | Freq: Four times a day (QID) | INTRAMUSCULAR | Status: DC | PRN
  Administered 2013-08-16: 4 mg via INTRAVENOUS
  Filled 2013-08-16: qty 2

## 2013-08-16 MED ORDER — HYDROCODONE-ACETAMINOPHEN 5-325 MG PO TABS
1.0000 | ORAL_TABLET | ORAL | Status: DC | PRN
  Administered 2013-08-17 (×2): 2 via ORAL
  Filled 2013-08-16 (×2): qty 2

## 2013-08-16 MED ORDER — KETOTIFEN FUMARATE 0.025 % OP SOLN
1.0000 [drp] | Freq: Every day | OPHTHALMIC | Status: DC | PRN
  Filled 2013-08-16: qty 5

## 2013-08-16 MED ORDER — ACETAMINOPHEN 325 MG PO TABS: 650.0000 mg | ORAL_TABLET | Freq: Four times a day (QID) | ORAL | Status: DC | PRN

## 2013-08-16 MED ORDER — LEVOTHYROXINE SODIUM 88 MCG PO TABS
88.0000 ug | ORAL_TABLET | Freq: Every day | ORAL | Status: DC
  Administered 2013-08-17: 88 ug via ORAL
  Filled 2013-08-16 (×3): qty 1

## 2013-08-16 MED ORDER — DARIFENACIN HYDROBROMIDE ER 7.5 MG PO TB24
7.5000 mg | ORAL_TABLET | Freq: Every day | ORAL | Status: DC
  Administered 2013-08-17: 7.5 mg via ORAL
  Filled 2013-08-16 (×3): qty 1

## 2013-08-16 MED ORDER — MORPHINE SULFATE 4 MG/ML IJ SOLN
4.0000 mg | Freq: Once | INTRAMUSCULAR | Status: AC
Start: 2013-08-16 — End: 2013-08-16
  Administered 2013-08-16: 4 mg via INTRAVENOUS
  Filled 2013-08-16: qty 1

## 2013-08-16 MED ORDER — ONDANSETRON HCL 4 MG PO TABS: 4.0000 mg | ORAL_TABLET | Freq: Four times a day (QID) | ORAL | Status: DC | PRN

## 2013-08-16 MED ORDER — IBUPROFEN 600 MG PO TABS
600.0000 mg | ORAL_TABLET | Freq: Three times a day (TID) | ORAL | Status: DC
  Administered 2013-08-16 – 2013-08-17 (×3): 600 mg via ORAL
  Filled 2013-08-16 (×3): qty 1

## 2013-08-16 MED ORDER — ALBUTEROL SULFATE (2.5 MG/3ML) 0.083% IN NEBU: 2.5000 mg | INHALATION_SOLUTION | RESPIRATORY_TRACT | Status: DC | PRN

## 2013-08-16 MED ORDER — HYDROCODONE-ACETAMINOPHEN 5-325 MG PO TABS
2.0000 | ORAL_TABLET | Freq: Once | ORAL | Status: AC
  Administered 2013-08-16: 2 via ORAL
  Filled 2013-08-16: qty 2

## 2013-08-16 MED ORDER — SODIUM CHLORIDE 0.9 % IV SOLN
INTRAVENOUS | Status: AC
  Administered 2013-08-16: 17:00:00 via INTRAVENOUS

## 2013-08-16 MED ORDER — LUBIPROSTONE 8 MCG PO CAPS
ORAL_CAPSULE | ORAL | Status: AC
  Filled 2013-08-16: qty 1

## 2013-08-16 MED ORDER — ENOXAPARIN SODIUM 40 MG/0.4ML ~~LOC~~ SOLN
40.0000 mg | SUBCUTANEOUS | Status: DC
  Administered 2013-08-16: 40 mg via SUBCUTANEOUS
  Filled 2013-08-16: qty 0.4

## 2013-08-16 MED ORDER — ACETAMINOPHEN 650 MG RE SUPP: 650.0000 mg | Freq: Four times a day (QID) | RECTAL | Status: DC | PRN

## 2013-08-16 MED ORDER — LUBIPROSTONE 8 MCG PO CAPS
8.0000 ug | ORAL_CAPSULE | Freq: Two times a day (BID) | ORAL | Status: DC
  Administered 2013-08-16 – 2013-08-17 (×2): 8 ug via ORAL
  Filled 2013-08-16 (×6): qty 1

## 2013-08-16 MED ORDER — PANTOPRAZOLE SODIUM 40 MG PO TBEC
80.0000 mg | DELAYED_RELEASE_TABLET | Freq: Every day | ORAL | Status: DC
  Administered 2013-08-17: 80 mg via ORAL
  Filled 2013-08-16: qty 2

## 2013-08-16 MED ORDER — LORAZEPAM 1 MG PO TABS
1.0000 mg | ORAL_TABLET | Freq: Three times a day (TID) | ORAL | Status: DC
  Administered 2013-08-16 – 2013-08-17 (×2): 1 mg via ORAL
  Filled 2013-08-16 (×2): qty 1

## 2013-08-16 MED ORDER — MORPHINE SULFATE 2 MG/ML IJ SOLN
2.0000 mg | INTRAMUSCULAR | Status: DC | PRN
  Administered 2013-08-16 – 2013-08-17 (×2): 2 mg via INTRAVENOUS
  Filled 2013-08-16 (×2): qty 1

## 2013-08-16 MED ORDER — ASPIRIN 81 MG PO CHEW
81.0000 mg | CHEWABLE_TABLET | Freq: Every day | ORAL | Status: DC
  Administered 2013-08-17: 81 mg via ORAL
  Filled 2013-08-16 (×3): qty 1

## 2013-08-16 MED ORDER — ATORVASTATIN CALCIUM 10 MG PO TABS
10.0000 mg | ORAL_TABLET | Freq: Every day | ORAL | Status: DC
  Administered 2013-08-16 – 2013-08-17 (×2): 10 mg via ORAL
  Filled 2013-08-16 (×2): qty 1

## 2013-08-16 NOTE — ED Notes (Signed)
Pt reports slipped on some icy steps at home this morning.  C/O pain to r ribs.   Denies any SOB.  Pain worse with movement.

## 2013-08-16 NOTE — H&P (Signed)
History and Physical  Julie Park [id number] DOB: September 05, 1933 DOA: 08/16/2013  Referring physician: EDP PCP: Tammi Sou, MD  Outpatient Specialists:  1. None  Chief Complaint: Fall followed by right-sided chest pain.  HPI: Julie Park is a 80 y.o. female , independent female with history of hyperlipidemia, IBS, anxiety, GERD, hypothyroid, urinary incontinence, presented to the ED on 08/16/13 after a fall at home followed by right-sided chest pain. This morning, she was heading to her backyard and stepped on some icy steps, slept and slid backwards and fell. She denies head injury, loss of consciousness or bleeding. She initially felt that she had hurt her back but when she tried to get up, she experienced excruciating right-sided chest pain. She lay on the ground for 10 minutes and then crawled up a staircase, called her friend who brought her to the ED. She describes right-sided chest pain as 10/10 in severity, worse with movements especially to the left side or with deep inspiration, difficulty taking deep breaths secondary to pain but no true dyspnea or cough. In the ED, chest x-ray revealed fractures of right 3-6 ribs. Patient is unable to function at this time secondary to severity of pain and she lives alone. Hospitalist admission requested for observation and pain management. Surgeon's were consulted by EDP and recommend nonoperative management.   Review of Systems: All systems reviewed and apart from history of presenting illness, are negative.  Past Medical History  Diagnosis Date  . Hyperlipidemia, mixed   . Diverticulosis     Diverticulitis (1982)  . Vertigo   . Anxiety   . GERD (gastroesophageal reflux disease)   . Hypothyroidism   . Urine incontinence   . Ulcer   . Shingles 1967 and 2010  . IBS (irritable bowel syndrome)   . Right lumbar radiculopathy     Intermittent x 2 episodes in summer 2014   Past Surgical History  Procedure Laterality Date  .  Tubal ligation  1974  . Tonsillectomy and adenoidectomy  1948  . Foot neuroma surgery  1994    right foot  . Wisdom tooth extraction    . Colonoscopy  2005    Dr. Arsenio Loader at [facility] (normal per pt report)  . Cataract extraction  2012    Dr. Herbert Deaner   Social History:  reports that she has quit smoking. Her smoking use included Cigarettes. She started smoking about 30 years ago. She has a 8 pack-year smoking history. She has never used smokeless tobacco. She reports that she does not drink alcohol or use illicit drugs. Single. Lives alone and independent of activities of daily living.  No Known Allergies  Family History  Problem Relation Age of Onset  . Cancer Father     pancreatic  . Asthma Daughter   . Heart attack Paternal Grandfather     Prior to Admission medications   Medication Sig Start Date End Date Taking? Authorizing Provider  acetaminophen (TYLENOL) 500 MG tablet Take 500-1,000 mg by mouth daily as needed for headache.   Yes Historical Provider, MD  aspirin 81 MG tablet Take 81 mg by mouth daily.   Yes Historical Provider, MD  atorvastatin (LIPITOR) 10 MG tablet Take 10 mg by mouth daily.   Yes Historical Provider, MD  esomeprazole (NEXIUM) 40 MG capsule Take 1 capsule (40 mg total) by mouth daily before breakfast. 03/02/13  Yes Tammi Sou, MD  ketotifen (ZADITOR) 0.025 % ophthalmic solution Place 1 drop into both eyes daily as needed (  for irritation.).   Yes Historical Provider, MD  levothyroxine (SYNTHROID, LEVOTHROID) 88 MCG tablet Take 1 tablet (88 mcg total) by mouth daily. 07/08/13  Yes Tammi Sou, MD  LORazepam (ATIVAN) 1 MG tablet TAKE 1 TABLET BY MOUTH EVERY EIGHT HOURS 08/12/13  Yes Tammi Sou, MD  lubiprostone (AMITIZA) 8 MCG capsule Take 1 capsule (8 mcg total) by mouth 2 (two) times daily with a meal. 07/16/13  Yes Tammi Sou, MD  solifenacin (VESICARE) 5 MG tablet Take 1 tablet (5 mg total) by mouth daily. 04/06/13  Yes Tammi Sou, MD    Physical Exam: Filed Vitals:   08/16/13 1041 08/16/13 1257  BP: 136/68 136/62  Pulse: 79 73  Temp: 98.2 F (36.8 C) 97.9 F (36.6 C)  TempSrc: Oral Oral  Resp: 18 18  Height: 5\' 6"  (1.676 m)   Weight: 76.204 kg (168 lb)   SpO2: 98% 97%     General exam: Moderately built and nourished pleasant elderly female patient, lying comfortably supine on the gurney in no obvious distress.  Head, eyes and ENT: Nontraumatic and normocephalic. Pupils equally reacting to light and accommodation. Oral mucosa moist.  Neck: Supple. No JVD, carotid bruit or thyromegaly.  Lymphatics: No lymphadenopathy.  Respiratory system: Clear to auscultation. Right sided lower rib cage tenderness but without any external bruising or wounds. No increased work of breathing. Chaperone present.  Cardiovascular system: S1 and S2 heard, RRR. No JVD, murmurs, gallops, clicks or pedal edema.  Gastrointestinal system: Abdomen is nondistended, soft and nontender. Normal bowel sounds heard. No organomegaly or masses appreciated.  Central nervous system: Alert and oriented. No focal neurological deficits.  Extremities: Symmetric 5 x 5 power. Peripheral pulses symmetrically felt.   Skin: No rashes or acute findings.  Musculoskeletal system: Negative exam.  Psychiatry: Pleasant and cooperative.   Labs on Admission:  Basic Metabolic Panel: No results found for this basename: NA, K, CL, CO2, GLUCOSE, BUN, CREATININE, CALCIUM, MG, PHOS,  in the last 168 hours Liver Function Tests: No results found for this basename: AST, ALT, ALKPHOS, BILITOT, PROT, ALBUMIN,  in the last 168 hours No results found for this basename: LIPASE, AMYLASE,  in the last 168 hours No results found for this basename: AMMONIA,  in the last 168 hours CBC:  Recent Labs Lab 08/16/13 1324  WBC 14.4*  HGB 15.5*  HCT 45.2  MCV 92.4  PLT 252   Cardiac Enzymes: No results found for this basename: CKTOTAL, CKMB, CKMBINDEX, TROPONINI,  in  the last 168 hours  BNP (last 3 results) No results found for this basename: PROBNP,  in the last 8760 hours CBG: No results found for this basename: GLUCAP,  in the last 168 hours  Radiological Exams on Admission: Dg Ribs Unilateral W/chest Right  08/16/2013   CLINICAL DATA:  Anterior mid right rib pain post fall, former smoker  EXAM: RIGHT RIBS AND CHEST - 3+ VIEW  COMPARISON:  Chest radiograph 01/10/2012  FINDINGS: Enlargement of cardiac silhouette.  Mediastinal contours and pulmonary vascularity normal.  Lungs are emphysematous but clear.  No pleural effusion or pneumothorax.  Diffuse osseous demineralization.  Pleural thickening/extra pleural density identified lateral mid right chest.  Fractures of the lateral right third fourth fifth and sixth ribs identified, with greatest displacement at third rib fracture.  IMPRESSION: Fractures of the lateral right third fourth fifth and sixth ribs.  Enlargement of cardiac silhouette.  Question COPD.   Electronically Signed   By: Crist Infante.D.  On: 08/16/2013 11:37    Assessment/Plan Principal Problem:   Rib fractures Active Problems:   Hyperlipidemia   Anxiety   GERD (gastroesophageal reflux disease)   Hypothyroidism   Fall at home   Fracture of rib of right side   1. Fracture of lateral right 3-6 ribs: Sustained after a mechanical fall. Admit to medical floor for observation and pain management. Discussed with Dr. Arnoldo Morale, Surgery who recommends nonoperative management and followup chest x-ray in a.m. Will place on ibuprofen 600 mg 3 times a day x2 days and when necessary opioids. Incentive spirometry. Patient has been advised to call immediately if there is any acute onset of dyspnea - she verbalizes understanding. 2. Leukocytosis: Likely stress response. No symptoms suggestive of infection. 3. History of Hyperlipidemia: Continue home medications 4. Anxiety: Continue home dose of Ativan. 5. History of GERD/IBS: Continue home  medications 6. History of hypothyroidism     Code Status: Full  Family Communication: Discussed with patient's female friend at bedside.  Disposition Plan: Home, possibly 2/23.   Time spent: 18 minutes  HONGALGI,ANAND, MD, FACP, FHM. [facility] Pager [phone]  If 7PM-7AM, please contact night-coverage [email or web address] Password [email or web address] 08/16/2013, 2:10 PM

## 2013-08-16 NOTE — ED Provider Notes (Signed)
CSN: [id number]     Arrival date & time 08/16/13  1032 History  This chart was scribed for Ephraim Hamburger, MD by Roxan Diesel, ED scribe.  This patient was seen in room APA03/APA03 and the patient's care was started at 11:10 AM.    Chief Complaint  Patient presents with  . fall, rib pain       The history is provided by the patient. No language interpreter was used.   HPI Comments: Julie Park is a 80 y.o. female with h/o  who presents to the Emergency Department complaining of a fall that occurred 4 hours ago.  Pt reports she was walking her dog and slipped on some icy steps at home.  She denies dizziness or syncope prior to the fall.  She thinks that she landed with impact to her back but is not certain because "it happened so fast."  She denies LOC.  Currently she complains of moderate-to-severe pain to her right ribs that is worsened by twisting movements, deep breathing, and sneezing.  She states she does not have pain when she stays still but when she twists her body she develops 8/10 pain.  She denies SOB or abdominal pain .   Past Medical History  Diagnosis Date  . Hyperlipidemia, mixed   . Diverticulosis     Diverticulitis (1982)  . Vertigo   . Anxiety   . GERD (gastroesophageal reflux disease)   . Hypothyroidism   . Urine incontinence   . Ulcer   . Shingles 1967 and 2010  . IBS (irritable bowel syndrome)   . Right lumbar radiculopathy     Intermittent x 2 episodes in summer 2014    Past Surgical History  Procedure Laterality Date  . Tubal ligation  1974  . Tonsillectomy and adenoidectomy  1948  . Foot neuroma surgery  1994    right foot  . Wisdom tooth extraction    . Colonoscopy  2005    Dr. [facility]senio Loader at [facility] (normal per pt report)  . Cataract extraction  2012    Dr. Herbert Deaner    Family History  Problem Relation Age of Onset  . Cancer Father     pancreatic  . Asthma Daughter   . Heart attack Paternal Grandfather     History  Substance Use  Topics  . Smoking status: Former Smoker -- 1.00 packs/day for 8 years    Types: Cigarettes    Start date: 06/26/1983  . Smokeless tobacco: Never Used  . Alcohol Use: No    OB History   Grav Para Term Preterm Abortions TAB SAB Ect Mult Living                  Review of Systems  Respiratory: Negative for shortness of breath.   Gastrointestinal: Negative for abdominal pain.  Musculoskeletal: Negative for neck pain.       Right rib pain  Neurological: Negative for dizziness, syncope and headaches.  All other systems reviewed and are negative.      Allergies  Review of patient's allergies indicates no known allergies.  Home Medications   Current Outpatient Rx  Name  Route  Sig  Dispense  Refill  . amoxicillin (AMOXIL) 875 MG tablet   Oral   Take 1 tablet (875 mg total) by mouth 2 (two) times daily.   20 tablet   0   . aspirin 81 MG tablet   Oral   Take 81 mg by mouth daily.         Marland Kitchen  atorvastatin (LIPITOR) 10 MG tablet   Oral   Take 10 mg by mouth daily.         . ciprofloxacin (CIPRO) 500 MG tablet   Oral   Take 500 mg by mouth as needed.         Marland Kitchen esomeprazole (NEXIUM) 40 MG capsule   Oral   Take 1 capsule (40 mg total) by mouth daily before breakfast.   90 capsule   1   . HYDROcodone-homatropine (HYCODAN) 5-1.5 MG/5ML syrup      1 tsp po q6h prn cough (watch for drowsiness)   120 mL   0   . levothyroxine (SYNTHROID, LEVOTHROID) 88 MCG tablet   Oral   Take 1 tablet (88 mcg total) by mouth daily.   30 tablet   3   . LORazepam (ATIVAN) 1 MG tablet      TAKE 1 TABLET BY MOUTH EVERY EIGHT HOURS   60 tablet   5   . lubiprostone (AMITIZA) 8 MCG capsule   Oral   Take 1 capsule (8 mcg total) by mouth 2 (two) times daily with a meal.   60 capsule   3   . meclizine (ANTIVERT) 25 MG tablet   Oral   Take 25 mg by mouth as needed.         . promethazine (PHENERGAN) 12.5 MG tablet   Oral   Take 1 tablet (12.5 mg total) by mouth every 8  (eight) hours as needed for nausea or vomiting.   20 tablet   0   . promethazine (PHENERGAN) 25 MG tablet   Oral   Take 25 mg by mouth as needed.         . solifenacin (VESICARE) 5 MG tablet   Oral   Take 1 tablet (5 mg total) by mouth daily.   90 tablet   1    Triage Vitals: BP 136/68  Pulse 79  Temp(Src) 98.2 F (36.8 C) (Oral)  Resp 18  Ht 5\' 6"  (1.676 m)  Wt 168 lb (76.204 kg)  BMI 27.13 kg/m2  SpO2 98%  Physical Exam  Nursing note and vitals reviewed. Constitutional: She is oriented to person, place, and time. She appears well-developed and well-nourished. No distress.  HENT:  Head: Normocephalic and atraumatic.  Eyes: EOM are normal.  Neck: Neck supple. No tracheal deviation present.  Cardiovascular: Normal rate, regular rhythm and normal heart sounds.   No murmur heard. Pulmonary/Chest: Effort normal and breath sounds normal. No respiratory distress. She has no wheezes. She has no rales. She exhibits tenderness (Anterior right chest tendernes under the right breast.  No ecchymosis or abrasions).  Abdominal: Soft. There is no tenderness.  Musculoskeletal: Normal range of motion.  Neurological: She is alert and oriented to person, place, and time.  Skin: Skin is warm and dry.  0.5-cm abrasion to right elbow  Psychiatric: She has a normal mood and affect. Her behavior is normal.    ED Course  Procedures (including critical care time)  DIAGNOSTIC STUDIES: Oxygen Saturation is 98% on room air, normal by my interpretation.    COORDINATION OF CARE: 11:15 AM-Discussed treatment plan which includes pain medication and CXR with pt at bedside and pt agreed to plan.    Labs Review Labs Reviewed - No data to display   Imaging Review Dg Ribs Unilateral W/chest Right  08/16/2013   CLINICAL DATA:  Anterior mid right rib pain post fall, former smoker  EXAM: RIGHT RIBS AND CHEST - 3+  VIEW  COMPARISON:  Chest radiograph 01/10/2012  FINDINGS: Enlargement of cardiac  silhouette.  Mediastinal contours and pulmonary vascularity normal.  Lungs are emphysematous but clear.  No pleural effusion or pneumothorax.  Diffuse osseous demineralization.  Pleural thickening/extra pleural density identified lateral mid right chest.  Fractures of the lateral right third fourth fifth and sixth ribs identified, with greatest displacement at third rib fracture.  IMPRESSION: Fractures of the lateral right third fourth fifth and sixth ribs.  Enlargement of cardiac silhouette.  Question COPD.   Electronically Signed   By: Lavonia Dana M.D.   On: 08/16/2013 11:37    EKG Interpretation   None       MDM   Final diagnoses:  Rib fractures    Patient with multiple rib fractures, as above. She was given oral hydrocodone in the ER but still had significant pain and was barely able to get out of bed. As she lives alone I do not feel she is stable for discharge at this time. There are no complications such as pneumothorax or hemothorax. I discussed with surgeon on call Dr. Arnoldo Morale, who recommends admission to the hospitalist for pain control as there is no acute need for surgical intervention. Discussed with hospitalist, will admit for observation.  I personally performed the services described in this documentation, which was scribed in my presence. The recorded information has been reviewed and is accurate.   Ephraim Hamburger, MD 08/16/13 [phone]

## 2013-08-17 ENCOUNTER — Observation Stay (HOSPITAL_COMMUNITY): Payer: Medicare Other

## 2013-08-17 DIAGNOSIS — E785 Hyperlipidemia, unspecified: Secondary | ICD-10-CM

## 2013-08-17 MED ORDER — HYDROCODONE-ACETAMINOPHEN 5-325 MG PO TABS: 1.0000 | ORAL_TABLET | Freq: Four times a day (QID) | ORAL | Status: DC | PRN

## 2013-08-17 NOTE — Consult Note (Signed)
Patient seen and chart reviewed. Patient fell yesterday and sustained multiple rib fractures on the right side. Initial chest x-ray revealed no pneumothorax. She was made in the hospital for control of her pain. Followup chest x-ray today reveals no pneumothorax. She may have a small healing well thorax, though it would be clinically significant. She states her pain is better today. She does have some splinting on the right side, but no rales or rhonchi are noted. No need for any further surgical intervention. She was told that she will have pain for several weeks. She should take deep breaths in and out to avoid pneumonia. Should she become acutely more short of breath, she was instructed to return to the emergency room for followup chest x-ray.

## 2013-08-17 NOTE — Discharge Instructions (Signed)
Rib Fracture  A rib fracture is a break or crack in one of the bones of the ribs. The ribs are a group of long, curved bones that wrap around your chest and attach to your spine. They protect your lungs and other organs in the chest cavity. A broken or cracked rib is often painful, but most do not cause other problems. Most rib fractures heal on their own over time. However, rib fractures can be more serious if multiple ribs are broken or if broken ribs move out of place and push against other structures.  CAUSES   · A direct blow to the chest. For example, this could happen during contact sports, a car accident, or a fall against a hard object.  · Repetitive movements with high force, such as pitching a baseball or having severe coughing spells.  SYMPTOMS   · Pain when you breathe in or cough.  · Pain when someone presses on the injured area.  DIAGNOSIS   Your caregiver will perform a physical exam. Various imaging tests may be ordered to confirm the diagnosis and to look for related injuries. These tests may include a chest X-ray, computed tomography (CT), magnetic resonance imaging (MRI), or a bone scan.  TREATMENT   Rib fractures usually heal on their own in 1 3 months. The longer healing period is often associated with a continued cough or other aggravating activities. During the healing period, pain control is very important. Medication is usually given to control pain. Hospitalization or surgery may be needed for more severe injuries, such as those in which multiple ribs are broken or the ribs have moved out of place.   HOME CARE INSTRUCTIONS   · Avoid strenuous activity and any activities or movements that cause pain. Be careful during activities and avoid bumping the injured rib.  · Gradually increase activity as directed by your caregiver.  · Only take over-the-counter or prescription medications as directed by your caregiver. Do not take other medications without asking your caregiver first.  · Apply ice  to the injured area for the first 1 2 days after you have been treated or as directed by your caregiver. Applying ice helps to reduce inflammation and pain.  · Put ice in a plastic bag.  · Place a towel between your skin and the bag.    · Leave the ice on for 15 20 minutes at a time, every 2 hours while you are awake.  · Perform deep breathing as directed by your caregiver. This will help prevent pneumonia, which is a common complication of a broken rib. Your caregiver may instruct you to:  · Take deep breaths several times a day.  · Try to cough several times a day, holding a pillow against the injured area.  · Use a device called an incentive spirometer to practice deep breathing several times a day.  · Drink enough fluids to keep your urine clear or pale yellow. This will help you avoid constipation.    · Do not wear a rib belt or binder. These restrict breathing, which can lead to pneumonia.    SEEK IMMEDIATE MEDICAL CARE IF:   · You have a fever.    · You have difficulty breathing or shortness of breath.    · You develop a continual cough, or you cough up thick or bloody sputum.  · You feel sick to your stomach (nausea), throw up (vomit), or have abdominal pain.    · You have worsening pain not controlled with medications.      MAKE SURE YOU:  · Understand these instructions.  · Will watch your condition.  · Will get help right away if you are not doing well or get worse.  Document Released: 06/11/2005 Document Revised: 02/11/2013 Document Reviewed: 08/13/2012  [employer]® Patient Information ©2014 [employer].

## 2013-08-17 NOTE — Discharge Summary (Signed)
Physician Discharge Summary  [name] OMV:[id number] DOB: [birth year] DOA: 08/16/2013  PCP: Tammi Sou, MD  Admit date: 08/16/2013 Discharge date: 08/17/2013  Time spent: 40 minutes  Recommendations for Outpatient Follow-up:  1. Follow up with primary care doctor in 2 weeks 2. Return to ED for worsening shortness of breath  Discharge Diagnoses:  Principal Problem:   Rib fractures Active Problems:   Hyperlipidemia   Anxiety   GERD (gastroesophageal reflux disease)   Hypothyroidism   Fall at home   Fracture of rib of right side   Discharge Condition: stable  Diet recommendation: low salt  Filed Weights   08/16/13 1041 08/16/13 1540  Weight: 76.204 kg (168 lb) 76.9 kg (169 lb 8.5 oz)    History of present illness:  Julie Park is a 80 y.o. female , independent female with history of hyperlipidemia, IBS, anxiety, GERD, hypothyroid, urinary incontinence, presented to the ED on 08/16/13 after a fall at home followed by right-sided chest pain. This morning, she was heading to her backyard and stepped on some icy steps, slept and slid backwards and fell. She denies head injury, loss of consciousness or bleeding. She initially felt that she had hurt her back but when she tried to get up, she experienced excruciating right-sided chest pain. She lay on the ground for 10 minutes and then crawled up a staircase, called her friend who brought her to the ED. She describes right-sided chest pain as 10/10 in severity, worse with movements especially to the left side or with deep inspiration, difficulty taking deep breaths secondary to pain but no true dyspnea or cough. In the ED, chest x-ray revealed fractures of right 3-6 ribs. Patient is unable to function at this time secondary to severity of pain and she lives alone. Hospitalist admission requested for observation and pain management. Surgeon's were consulted by EDP and recommend nonoperative management.   Hospital Course:  This  patient was admitted to the hospital after suffering a mechanical fall when she slipped on ice. Patient suffered right-sided rib fractures and was admitted for observation due to worsening right-sided chest pain. There was no pneumothorax noted on chest x-ray. Case was discussed with general surgery who recommended repeat chest x-ray in the morning. This was repeated which again did not show any evidence of pneumothorax. There was mention of possible atelectasis versus small hemothorax. No surgical intervention was felt necessary. Case was discussed with Dr. Arnoldo Morale. The patient clinically is improved. She does not have any shortness of breath. Her pain appears to be better managed. She will be continued incentive spirometry as well as pain management. She's been advised to return to the emergency room if she has acutely worsening shortness of breath. She is otherwise stable for discharge.  Procedures:    Consultations:  General surgery  Discharge Exam: Filed Vitals:   08/17/13 0417  BP: 149/57  Pulse: 84  Temp: 98.5 F (36.9 C)  Resp: 18    General: NAD Cardiovascular: S1, S2 RRR Respiratory: CTA B  Discharge Instructions  Discharge Orders   Future Appointments Provider Department Dept Phone   09/17/2013 9:00 AM Tammi Sou, MD [place] [phone]   Future Orders Complete By Expires   Call MD for:  difficulty breathing, headache or visual disturbances  As directed    Call MD for:  temperature >100.4  As directed    Diet - low sodium heart healthy  As directed    Increase activity slowly  As  directed        Medication List         acetaminophen 500 MG tablet  Commonly known as:  TYLENOL  Take 500-1,000 mg by mouth daily as needed for headache.     aspirin 81 MG tablet  Take 81 mg by mouth daily.     atorvastatin 10 MG tablet  Commonly known as:  LIPITOR  Take 10 mg by mouth daily.     esomeprazole 40 MG capsule  Commonly known as:   NEXIUM  Take 1 capsule (40 mg total) by mouth daily before breakfast.     HYDROcodone-acetaminophen 5-325 MG per tablet  Commonly known as:  NORCO/VICODIN  Take 1-2 tablets by mouth every 6 (six) hours as needed for moderate pain.     ketotifen 0.025 % ophthalmic solution  Commonly known as:  ZADITOR  Place 1 drop into both eyes daily as needed (for irritation.).     levothyroxine 88 MCG tablet  Commonly known as:  SYNTHROID, LEVOTHROID  Take 1 tablet (88 mcg total) by mouth daily.     LORazepam 1 MG tablet  Commonly known as:  ATIVAN  TAKE 1 TABLET BY MOUTH EVERY EIGHT HOURS     lubiprostone 8 MCG capsule  Commonly known as:  AMITIZA  Take 1 capsule (8 mcg total) by mouth 2 (two) times daily with a meal.     solifenacin 5 MG tablet  Commonly known as:  VESICARE  Take 1 tablet (5 mg total) by mouth daily.       No Known Allergies     Follow-up Information   Follow up with MCGOWEN,PHILIP H, MD. Schedule an appointment as soon as possible for a visit in 2 weeks.   Specialty:  Family Medicine   Contact information:   [id number] [place] [address] [phone]        The results of significant diagnostics from this hospitalization (including imaging, microbiology, ancillary and laboratory) are listed below for reference.    Significant Diagnostic Studies: X-ray Chest Pa And Lateral   08/17/2013   CLINICAL DATA:  Fall and right rib fracture.  EXAM: CHEST  2 VIEW  COMPARISON:  08/16/2013  FINDINGS: Two views of the chest demonstrate displaced right rib fractures. Densities at the right lung base could represent atelectasis but cannot exclude pleural fluid or small hemothorax. There may also be pleural-based densities along the right lung apex. Stable calcification in the periphery of the left upper lung. Otherwise, the left lung is clear. Heart size is within normal limits. The trachea is midline.  IMPRESSION: Displaced right rib fractures with right basilar  densities. Findings are suggestive for atelectasis. Cannot exclude small right pleural effusion or small right hemothorax.   Electronically Signed   By: Markus Daft M.D.   On: 08/17/2013 09:36   Dg Ribs Unilateral W/chest Right  08/16/2013   CLINICAL DATA:  Anterior mid right rib pain post fall, former smoker  EXAM: RIGHT RIBS AND CHEST - 3+ VIEW  COMPARISON:  Chest radiograph 01/10/2012  FINDINGS: Enlargement of cardiac silhouette.  Mediastinal contours and pulmonary vascularity normal.  Lungs are emphysematous but clear.  No pleural effusion or pneumothorax.  Diffuse osseous demineralization.  Pleural thickening/extra pleural density identified lateral mid right chest.  Fractures of the lateral right third fourth fifth and sixth ribs identified, with greatest displacement at third rib fracture.  IMPRESSION: Fractures of the lateral right third fourth fifth and sixth ribs.  Enlargement of  cardiac silhouette.  Question COPD.   Electronically Signed   By: Lavonia Dana M.D.   On: 08/16/2013 11:37    Microbiology: No results found for this or any previous visit (from the past 240 hour(s)).   Labs: Basic Metabolic Panel:  Recent Labs Lab 08/16/13 1324  NA 135*  K 4.2  CL 99  CO2 25  GLUCOSE 116*  BUN 8  CREATININE 0.84  CALCIUM 9.0   Liver Function Tests: No results found for this basename: AST, ALT, ALKPHOS, BILITOT, PROT, ALBUMIN,  in the last 168 hours No results found for this basename: LIPASE, AMYLASE,  in the last 168 hours No results found for this basename: AMMONIA,  in the last 168 hours CBC:  Recent Labs Lab 08/16/13 1324  WBC 14.4*  HGB 15.5*  HCT 45.2  MCV 92.4  PLT 252   Cardiac Enzymes: No results found for this basename: CKTOTAL, CKMB, CKMBINDEX, TROPONINI,  in the last 168 hours BNP: BNP (last 3 results) No results found for this basename: PROBNP,  in the last 8760 hours CBG: No results found for this basename: GLUCAP,  in the last 168  hours     Signed:  MEMON,JEHANZEB  [facility] 08/17/2013, 12:08 PM

## 2013-08-17 NOTE — Care Management Note (Signed)
    Page 1 of 1   08/17/2013     12:11:05 PM   CARE MANAGEMENT NOTE 08/17/2013  Patient:  Julie Park, Julie Park   Account Number:  [id number]  Date Initiated:  08/17/2013  Documentation initiated by:  Theophilus Kinds  Subjective/Objective Assessment:   Pt admitted from home with rib fracture and fall. Pt lives alone and will return home at discharge. Pt is independent with ADL's. Pt has a daughter that is very active in the care of the pt and wants pt to come stay with her at discharge.     Action/Plan:   Pt stated that she also has a good friend that will come check on her frequently. No Cm needs noted. Pt discharged home today.   Anticipated DC Date:  08/17/2013   Anticipated DC Plan:  [place]  CM consult      Choice offered to / List presented to:             Status of service:  Completed, signed off Medicare Important Message given?  NA - LOS <3 / Initial given by admissions (If response is "NO", the following Medicare IM given date fields will be blank) Date Medicare IM given:   Date Additional Medicare IM given:    Discharge Disposition:  HOME/SELF CARE  Per UR Regulation:    If discussed at Long Length of Stay Meetings, dates discussed:    Comments:  08/17/13 [place], RN BSN CM

## 2013-08-19 ENCOUNTER — Telehealth: Payer: Self-pay | Admitting: Family Medicine

## 2013-08-19 MED ORDER — OXYCODONE HCL 5 MG PO TABS: ORAL_TABLET | ORAL | Status: DC

## 2013-08-19 NOTE — Telephone Encounter (Signed)
She said that she will have one of her children to come get the Rx.

## 2013-08-19 NOTE — Telephone Encounter (Signed)
Oxycodone rx printed. 

## 2013-08-19 NOTE — Telephone Encounter (Signed)
Pt aware that Rx is at front desk for pick up.

## 2013-08-19 NOTE — Telephone Encounter (Signed)
Patient states she fractured some ribs over the weekend and she was Rx'ed vicodin but she is still experiencing a lot of pain. Can you Rx anything stronger?  Please advise.

## 2013-08-19 NOTE — Telephone Encounter (Signed)
Yes, I can prescribe oxycodone but she will need to come and pick it up.  Let me know if she wants to do this and I'll print rx.-thx

## 2013-08-21 ENCOUNTER — Telehealth: Payer: Self-pay | Admitting: Family Medicine

## 2013-08-21 NOTE — Telephone Encounter (Signed)
Patient not having a normal BM now that she is on pain medications.  She normally takes metamucil every day but she is now taking it BID.  Should that be okay or anything else you recommend?

## 2013-08-21 NOTE — Telephone Encounter (Signed)
Metamucil one or two times daily is fine. Also, add generic senakot S 2 tabs every night as long as she is on pain med. Also, Baker Pierini is on her med list.  Ask if she is taking this every day.  If not, she should take 1 of these twice a day if she still has some around.

## 2013-08-21 NOTE — Telephone Encounter (Signed)
Patient states that she is taking the amitiza BID and she will begin the senakot S #2 tabs every night until off pain medication.

## 2013-08-24 ENCOUNTER — Other Ambulatory Visit: Payer: Self-pay | Admitting: Family Medicine

## 2013-08-24 ENCOUNTER — Emergency Department (INDEPENDENT_AMBULATORY_CARE_PROVIDER_SITE_OTHER): Payer: Medicare Other

## 2013-08-24 ENCOUNTER — Emergency Department
Admission: EM | Admit: 2013-08-24 | Discharge: 2013-08-24 | Disposition: A | Discharge status: Home / Self Care | Payer: Medicare Other | Source: Home / Self Care | Attending: Emergency Medicine | Admitting: Emergency Medicine

## 2013-08-24 ENCOUNTER — Encounter: Payer: Self-pay | Admitting: Emergency Medicine

## 2013-08-24 DIAGNOSIS — S2249XA Multiple fractures of ribs, unspecified side, initial encounter for closed fracture: Secondary | ICD-10-CM

## 2013-08-24 DIAGNOSIS — X58XXXA Exposure to other specified factors, initial encounter: Secondary | ICD-10-CM

## 2013-08-24 MED ORDER — TRAMADOL HCL 50 MG PO TABS: 50.0000 mg | ORAL_TABLET | Freq: Four times a day (QID) | ORAL | Status: DC | PRN

## 2013-08-24 NOTE — ED Notes (Signed)
Julie Park was seen @ [facility] ED on 08/16/13 post fall on ice for a right rib fracture. She was told to f/u and get a f/u X-ray. Pain is unchanged. No bruising to ribs but bruise present on right buttocks.

## 2013-08-24 NOTE — ED Provider Notes (Signed)
CSN: [id number]     Arrival date & time 08/24/13  1117 History   First MD Initiated Contact with Patient 08/24/13 1135     Chief Complaint  Patient presents with  . Rib Fracture   (Consider location/radiation/quality/duration/timing/severity/associated sxs/prior Treatment) HPI This patient was seen had any pain hospital on February 22 of this year status post fall on ice and diagnosed with right rib fractures.  She was admitted for pain control and then sent home on a prescription of oxycodone.  Oxycodone gave her constipation as well as abdominal pain and then she took some Senokot which tended to ease off the constipation little bit.  However she still having some abdominal pain from the medicine.  She was told to followup in about a week for followup x-rays and is difficult her PCP she came here.  The patient continues to have right-sided pain but has since improved somewhat on the pain medicine.  No shortness of breath or other chest pain.  Moving and taking deep breaths hurt.  No fever chills, coughing.  Past Medical History  Diagnosis Date  . Hyperlipidemia, mixed   . Diverticulosis     Diverticulitis (1982)  . Vertigo   . Anxiety   . GERD (gastroesophageal reflux disease)   . Hypothyroidism   . Urine incontinence   . Ulcer   . Shingles 1967 and 2010  . IBS (irritable bowel syndrome)   . Right lumbar radiculopathy     Intermittent x 2 episodes in summer 2014   Past Surgical History  Procedure Laterality Date  . Tubal ligation  1974  . Tonsillectomy and adenoidectomy  1948  . Foot neuroma surgery  1994    right foot  . Wisdom tooth extraction    . Colonoscopy  2005    Dr. Arsenio Loader at [facility] (normal per pt report)  . Cataract extraction  2012    Dr. Herbert Deaner   Family History  Problem Relation Age of Onset  . Cancer Father     pancreatic  . Asthma Daughter   . Heart attack Paternal Grandfather    History  Substance Use Topics  . Smoking status: Former Smoker -- 1.00  packs/day for 8 years    Types: Cigarettes    Start date: 06/26/1983  . Smokeless tobacco: Never Used  . Alcohol Use: No   OB History   Grav Para Term Preterm Abortions TAB SAB Ect Mult Living                 Review of Systems  All other systems reviewed and are negative.    Allergies  Review of patient's allergies indicates no known allergies.  Home Medications   Current Outpatient Rx  Name  Route  Sig  Dispense  Refill  . acetaminophen (TYLENOL) 500 MG tablet   Oral   Take 500-1,000 mg by mouth daily as needed for headache.         Marland Kitchen aspirin 81 MG tablet   Oral   Take 81 mg by mouth daily.         Marland Kitchen atorvastatin (LIPITOR) 10 MG tablet   Oral   Take 10 mg by mouth daily.         Marland Kitchen HYDROcodone-acetaminophen (NORCO/VICODIN) 5-325 MG per tablet   Oral   Take 1-2 tablets by mouth every 6 (six) hours as needed for moderate pain.   30 tablet   0   . ketotifen (ZADITOR) 0.025 % ophthalmic solution   Both Eyes  Place 1 drop into both eyes daily as needed (for irritation.).         Marland Kitchen levothyroxine (SYNTHROID, LEVOTHROID) 88 MCG tablet   Oral   Take 1 tablet (88 mcg total) by mouth daily.   30 tablet   3   . LORazepam (ATIVAN) 1 MG tablet      TAKE 1 TABLET BY MOUTH EVERY EIGHT HOURS   60 tablet   5   . lubiprostone (AMITIZA) 8 MCG capsule   Oral   Take 1 capsule (8 mcg total) by mouth 2 (two) times daily with a meal.   60 capsule   3   . NEXIUM 40 MG capsule      TAKE 1 CAPSULE (40 MG TOTAL) BY MOUTH DAILY BEFORE BREAKFAST.   90 capsule   1   . oxyCODONE (OXY IR/ROXICODONE) 5 MG immediate release tablet      1-2 tabs po q6h prn pain   60 tablet   0   . solifenacin (VESICARE) 5 MG tablet   Oral   Take 1 tablet (5 mg total) by mouth daily.   90 tablet   1   . traMADol (ULTRAM) 50 MG tablet   Oral   Take 1 tablet (50 mg total) by mouth every 6 (six) hours as needed.   30 tablet   0    BP 128/68  Pulse 86  Temp(Src) 98.8 F (37.1  C) (Oral)  Resp 16  Wt 168 lb (76.204 kg)  SpO2 95% Physical Exam  Nursing note and vitals reviewed. Constitutional: She is oriented to person, place, and time. She appears well-developed and well-nourished.  HENT:  Head: Normocephalic and atraumatic.  Eyes: No scleral icterus.  Neck: Neck supple.  Cardiovascular: Regular rhythm and normal heart sounds.   Pulmonary/Chest: Effort normal and breath sounds normal. No respiratory distress. She has no decreased breath sounds. She has no wheezes.    Neurological: She is alert and oriented to person, place, and time.  Skin: Skin is warm and dry.  Psychiatric: She has a normal mood and affect. Her speech is normal.    ED Course  Procedures (including critical care time) Labs Review Labs Reviewed - No data to display Imaging Review Dg Ribs Unilateral W/chest Right  08/24/2013   CLINICAL DATA:  [place] on ice.  Right rib pain.  EXAM: RIGHT RIBS AND CHEST - 3+ VIEW  COMPARISON:  08/17/2013  FINDINGS: Again noted are displaced fractures involving the right third, fourth, fifth and probably the sixth rib. Difficult to exclude subtle fractures involving lower right ribs. There are right basilar densities which most likely represent a combination of atelectasis and pleural fluid. There is no evidence for a pneumothorax. Left lung is clear. Heart and mediastinum are stable.  IMPRESSION: Again noted are right rib fractures.  Right basilar densities suggest atelectasis and probable small pleural effusion.   Electronically Signed   By: Markus Daft M.D.   On: 08/24/2013 12:17     MDM   1. Fracture of multiple ribs    Chest and rib x-rays are obtained and read by the radiologist as above.  Due to the side effects of her narcotic pain mecdications, I'm to switch her to tramadol which she has taken in the past for knee pain and has helped. Can also take APAP.  We tried an abdominal binder on the patient which seemed to help her pain.  However I told her that  due to her age it can  limit breathing and increase risk of developing pneumonia, however she seems active enough and far enough out from the original fracture that it may help her throughout the day and she's going to try to use it.  Do not wear all day, continue to take deep breaths, do not wear while sleeping.  She will need followup with her primary care physician in a few weeks, sooner if worsening symptoms.    Janeann Forehand, MD 08/24/13 1236

## 2013-08-28 ENCOUNTER — Telehealth: Payer: Self-pay | Admitting: Emergency Medicine

## 2013-08-31 ENCOUNTER — Encounter (HOSPITAL_COMMUNITY): Payer: Self-pay | Admitting: Emergency Medicine

## 2013-08-31 ENCOUNTER — Emergency Department (HOSPITAL_COMMUNITY)
Admission: EM | Admit: 2013-08-31 | Discharge: 2013-08-31 | Disposition: A | Discharge status: Home / Self Care | Payer: Medicare Other | Attending: Emergency Medicine | Admitting: Emergency Medicine

## 2013-08-31 DIAGNOSIS — Z8781 Personal history of (healed) traumatic fracture: Secondary | ICD-10-CM | POA: Insufficient documentation

## 2013-08-31 DIAGNOSIS — R42 Dizziness and giddiness: Secondary | ICD-10-CM

## 2013-08-31 DIAGNOSIS — R638 Other symptoms and signs concerning food and fluid intake: Secondary | ICD-10-CM | POA: Insufficient documentation

## 2013-08-31 DIAGNOSIS — Z87891 Personal history of nicotine dependence: Secondary | ICD-10-CM | POA: Insufficient documentation

## 2013-08-31 DIAGNOSIS — K219 Gastro-esophageal reflux disease without esophagitis: Secondary | ICD-10-CM | POA: Insufficient documentation

## 2013-08-31 DIAGNOSIS — G479 Sleep disorder, unspecified: Secondary | ICD-10-CM | POA: Insufficient documentation

## 2013-08-31 DIAGNOSIS — R5383 Other fatigue: Secondary | ICD-10-CM

## 2013-08-31 DIAGNOSIS — R0682 Tachypnea, not elsewhere classified: Secondary | ICD-10-CM | POA: Insufficient documentation

## 2013-08-31 DIAGNOSIS — R197 Diarrhea, unspecified: Secondary | ICD-10-CM | POA: Insufficient documentation

## 2013-08-31 DIAGNOSIS — Z8619 Personal history of other infectious and parasitic diseases: Secondary | ICD-10-CM | POA: Insufficient documentation

## 2013-08-31 DIAGNOSIS — Z7982 Long term (current) use of aspirin: Secondary | ICD-10-CM | POA: Insufficient documentation

## 2013-08-31 DIAGNOSIS — Z8739 Personal history of other diseases of the musculoskeletal system and connective tissue: Secondary | ICD-10-CM | POA: Insufficient documentation

## 2013-08-31 DIAGNOSIS — R0602 Shortness of breath: Secondary | ICD-10-CM | POA: Insufficient documentation

## 2013-08-31 DIAGNOSIS — F411 Generalized anxiety disorder: Secondary | ICD-10-CM | POA: Insufficient documentation

## 2013-08-31 DIAGNOSIS — Z791 Long term (current) use of non-steroidal anti-inflammatories (NSAID): Secondary | ICD-10-CM | POA: Insufficient documentation

## 2013-08-31 DIAGNOSIS — Z872 Personal history of diseases of the skin and subcutaneous tissue: Secondary | ICD-10-CM | POA: Insufficient documentation

## 2013-08-31 DIAGNOSIS — E039 Hypothyroidism, unspecified: Secondary | ICD-10-CM | POA: Insufficient documentation

## 2013-08-31 DIAGNOSIS — Z79899 Other long term (current) drug therapy: Secondary | ICD-10-CM | POA: Insufficient documentation

## 2013-08-31 DIAGNOSIS — R5381 Other malaise: Secondary | ICD-10-CM | POA: Insufficient documentation

## 2013-08-31 DIAGNOSIS — E782 Mixed hyperlipidemia: Secondary | ICD-10-CM | POA: Insufficient documentation

## 2013-08-31 DIAGNOSIS — K589 Irritable bowel syndrome without diarrhea: Secondary | ICD-10-CM | POA: Insufficient documentation

## 2013-08-31 LAB — BASIC METABOLIC PANEL
BUN: 8 mg/dL (ref 6–23)
CO2: 24 mEq/L (ref 19–32)
Calcium: 9.3 mg/dL (ref 8.4–10.5)
Chloride: 94 mEq/L — ABNORMAL LOW (ref 96–112)
Creatinine, Ser: 0.78 mg/dL (ref 0.50–1.10)
GFR calc Af Amer: >90 mL/min (ref >90)
GFR calc non Af Amer: 78 mL/min — ABNORMAL LOW (ref >90)
Glucose, Bld: 114 mg/dL — ABNORMAL HIGH (ref 70–99)
Potassium: 4.4 mEq/L (ref 3.7–5.3)
Sodium: 131 mEq/L — ABNORMAL LOW (ref 137–147)

## 2013-08-31 LAB — CBC WITH DIFFERENTIAL/PLATELET
Basophils Absolute: 0 10*3/uL (ref 0.0–0.1)
Basophils Relative: 0 % (ref 0–1)
Eosinophils Absolute: 0.1 10*3/uL (ref 0.0–0.7)
Eosinophils Relative: 1 % (ref 0–5)
HCT: 41.1 % (ref 36.0–46.0)
Hemoglobin: 14.2 g/dL (ref 12.0–15.0)
Lymphocytes Relative: 26 % (ref 12–46)
Lymphs Abs: 2.2 10*3/uL (ref 0.7–4.0)
MCH: 31.5 pg (ref 26.0–34.0)
MCHC: 34.5 g/dL (ref 30.0–36.0)
MCV: 91.1 fL (ref 78.0–100.0)
Monocytes Absolute: 0.7 10*3/uL (ref 0.1–1.0)
Monocytes Relative: 8 % (ref 3–12)
Neutro Abs: 5.4 10*3/uL (ref 1.7–7.7)
Neutrophils Relative %: 65 % (ref 43–77)
Platelets: 322 10*3/uL (ref 150–400)
RBC: 4.51 MIL/uL (ref 3.87–5.11)
RDW: 12.8 % (ref 11.5–15.5)
WBC: 8.4 10*3/uL (ref 4.0–10.5)

## 2013-08-31 LAB — URINE MICROSCOPIC-ADD ON

## 2013-08-31 LAB — URINALYSIS, ROUTINE W REFLEX MICROSCOPIC
Bilirubin Urine: NEGATIVE
Glucose, UA: NEGATIVE mg/dL
Hgb urine dipstick: NEGATIVE
Ketones, ur: NEGATIVE mg/dL
Nitrite: NEGATIVE
Protein, ur: NEGATIVE mg/dL
Specific Gravity, Urine: <1.005 — ABNORMAL LOW (ref 1.005–1.030)
Urobilinogen, UA: 0.2 mg/dL (ref 0.0–1.0)
pH: 5.5 (ref 5.0–8.0)

## 2013-08-31 MED ORDER — SODIUM CHLORIDE 0.9 % IV BOLUS (SEPSIS)
1000.0000 mL | Freq: Once | INTRAVENOUS | Status: AC
  Administered 2013-08-31: 1000 mL via INTRAVENOUS

## 2013-08-31 NOTE — Discharge Instructions (Signed)

## 2013-08-31 NOTE — ED Notes (Signed)
Hx multiple rib fx's s/p fall 2/22. Hasn't felt well since. Today c/o dizziness, weakness, no sleep, and no appetite for 2 days. Also diarrhea 2 days ago.

## 2013-08-31 NOTE — ED Provider Notes (Signed)
CSN: [id number]     Arrival date & time 08/31/13  1853 History   This chart was scribed for Virgel Manifold, MD by Lovena Le Day, ED scribe. This patient was seen in room APA01/APA01 and the patient's care was started at [facility].  Chief Complaint  Patient presents with  . Dizziness   The history is provided by the patient. No language interpreter was used.   HPI Comments: Julie Park is a 80 y.o. female who presents to the Emergency Department for generalized weakness and feeling like she is going to pass out, constant, gradually worsened over the past few days, worsened today. She reports associated diarrhea, decreased appetite and light headed. She is s/p fx ribs when she was seen here a few weeks ago and states that she has not been doing well and having difficulty sleeping at night, having to sleep in a chair at home. She was taking hydrocodone and oxycodone which irritated her stomach, she was switched to tramadol. She denies any sick contacts. She states her ribs have somewhat improved w/pain and that she is now able to lie down flat but still increased pain w/movement. She denies any other pain. She states some intermittent mild labored breathing due to her generalized weakness, feeling SOB walking to the bathroom at home. She states has been sitting in recliner at home all day and night b/c she cant sleep in her bed. She reports had some lower leg swelling last week which has since resolved.   Past Medical History  Diagnosis Date  . Hyperlipidemia, mixed   . Diverticulosis     Diverticulitis (1982)  . Vertigo   . Anxiety   . GERD (gastroesophageal reflux disease)   . Hypothyroidism   . Urine incontinence   . Ulcer   . Shingles 1967 and 2010  . IBS (irritable bowel syndrome)   . Right lumbar radiculopathy     Intermittent x 2 episodes in summer 2014   Past Surgical History  Procedure Laterality Date  . Tubal ligation  1974  . Tonsillectomy and adenoidectomy  1948  . Foot neuroma  surgery  1994    right foot  . Wisdom tooth extraction    . Colonoscopy  2005    Dr. Arsenio Loader at [facility] (normal per pt report)  . Cataract extraction  2012    Dr. Herbert Deaner   Family History  Problem Relation Age of Onset  . Cancer Father     pancreatic  . Asthma Daughter   . Heart attack Paternal Grandfather    History  Substance Use Topics  . Smoking status: Former Smoker -- 1.00 packs/day for 8 years    Types: Cigarettes    Start date: 06/26/1983  . Smokeless tobacco: Never Used  . Alcohol Use: No   OB History   Grav Para Term Preterm Abortions TAB SAB Ect Mult Living                 Review of Systems  Constitutional: Positive for appetite change and fatigue. Negative for fever and chills.  Respiratory: Positive for shortness of breath. Negative for cough.   Cardiovascular: Negative for chest pain.  Gastrointestinal: Positive for diarrhea. Negative for nausea, vomiting and abdominal pain.  Genitourinary: Negative for dysuria.  Musculoskeletal: Negative for back pain.  Neurological: Positive for light-headedness.  All other systems reviewed and are negative.   Allergies  Review of patient's allergies indicates no known allergies.  Home Medications   Current Outpatient Rx  Name  Route  Sig  Dispense  Refill  . acetaminophen (TYLENOL) 500 MG tablet   Oral   Take 500-1,000 mg by mouth daily as needed for headache.         Marland Kitchen aspirin 81 MG tablet   Oral   Take 81 mg by mouth daily.         Marland Kitchen atorvastatin (LIPITOR) 10 MG tablet   Oral   Take 10 mg by mouth daily.         Marland Kitchen HYDROcodone-acetaminophen (NORCO/VICODIN) 5-325 MG per tablet   Oral   Take 1-2 tablets by mouth every 6 (six) hours as needed for moderate pain.   30 tablet   0   . ketotifen (ZADITOR) 0.025 % ophthalmic solution   Both Eyes   Place 1 drop into both eyes daily as needed (for irritation.).         Marland Kitchen levothyroxine (SYNTHROID, LEVOTHROID) 88 MCG tablet   Oral   Take 1 tablet (88 mcg  total) by mouth daily.   30 tablet   3   . LORazepam (ATIVAN) 1 MG tablet      TAKE 1 TABLET BY MOUTH EVERY EIGHT HOURS   60 tablet   5   . lubiprostone (AMITIZA) 8 MCG capsule   Oral   Take 1 capsule (8 mcg total) by mouth 2 (two) times daily with a meal.   60 capsule   3   . NEXIUM 40 MG capsule      TAKE 1 CAPSULE (40 MG TOTAL) BY MOUTH DAILY BEFORE BREAKFAST.   90 capsule   1   . oxyCODONE (OXY IR/ROXICODONE) 5 MG immediate release tablet      1-2 tabs po q6h prn pain   60 tablet   0   . solifenacin (VESICARE) 5 MG tablet   Oral   Take 1 tablet (5 mg total) by mouth daily.   90 tablet   1   . traMADol (ULTRAM) 50 MG tablet   Oral   Take 1 tablet (50 mg total) by mouth every 6 (six) hours as needed.   30 tablet   0    Triage Vitals: BP 153/67  Pulse 84  Temp(Src) 98 F (36.7 C) (Oral)  Resp 16  Ht 5\' 6"  (1.676 m)  Wt 163 lb (73.936 kg)  BMI 26.32 kg/m2  SpO2 99%  Physical Exam  Nursing note and vitals reviewed. Constitutional: She appears well-developed and well-nourished. No distress.  HENT:  Head: Normocephalic and atraumatic.  Eyes: Conjunctivae are normal. Pupils are equal, round, and reactive to light. Right eye exhibits no discharge. Left eye exhibits no discharge.  Neck: Neck supple.  Cardiovascular: Normal rate, regular rhythm and normal heart sounds.  Exam reveals no gallop and no friction rub.   No murmur heard. Pulmonary/Chest: Effort normal and breath sounds normal. No respiratory distress.  Mildly tachypneic  Abdominal: Soft. She exhibits no distension. There is no tenderness.  Musculoskeletal: She exhibits no edema and no tenderness.  Neurological: She is alert.  Skin: Skin is warm and dry.  Psychiatric: She has a normal mood and affect. Her behavior is normal. Thought content normal.    ED Course  Procedures (including critical care time) DIAGNOSTIC STUDIES: Oxygen Saturation is 99% on room air, normal by my interpretation.     COORDINATION OF CARE: At 800 PM Discussed treatment plan with patient which includes IV fluids, blood work, EKG. Patient agrees.   945 PM Patient at this time expressing she would like  to be admitted to the hospital which would allow her to rest and sleep better than she has been at home although her workup tonight in the ED does not suggest or indicate an admission for her, we discussed although she might not be feeling well, she is safe to go home. Her family understands this and is agreeable to modifying what she has been doing at home for comfort. She states has been having difficulty sleeping at home b/c she has difficulty laying flat in her bed with her healing rib fxs.   Labs Review Labs Reviewed - No data to display Imaging Review No results found.   EKG Interpretation   Date/Time:  Monday August 31 2013 20:36:28 EDT Ventricular Rate:  74 PR Interval:  172 QRS Duration: 74 QT Interval:  376 QTC Calculation: 417 R Axis:   18 Text Interpretation:  Normal sinus rhythm Normal ECG No previous ECGs  available ED PHYSICIAN INTERPRETATION AVAILABLE IN [facility]  Confirmed by TEST, Record ([id number]) on 09/02/2013 11:42:19 AM      MDM   Final diagnoses:  Dizziness    80yF with likely deconditioning after recent fall and rib fxs. W/u today pretty unremarkable. Low suspicion for emergent process. Pt may benefit from home health or rehab services. FU with PCP ro discuss this.   I personally performed the services described in this documentation, which was scribed in my presence. The recorded information has been reviewed and is accurate.     Virgel Manifold, MD 09/05/13 [phone]

## 2013-09-01 ENCOUNTER — Telehealth: Payer: Self-pay | Admitting: Family Medicine

## 2013-09-01 DIAGNOSIS — Z7409 Other reduced mobility: Secondary | ICD-10-CM

## 2013-09-01 DIAGNOSIS — S2249XA Multiple fractures of ribs, unspecified side, initial encounter for closed fracture: Secondary | ICD-10-CM

## 2013-09-01 NOTE — Telephone Encounter (Signed)
Daughter is calling to get mother a home health nurse and hospital bed, she had a fall recently and has been going downhill since. She lives alone and has been sleeping in a chair because she cannot get in and out of the bed

## 2013-09-01 NOTE — Telephone Encounter (Signed)
Please advise 

## 2013-09-02 NOTE — Telephone Encounter (Signed)
[facility] nursing eval has been ordered.

## 2013-09-07 ENCOUNTER — Other Ambulatory Visit: Payer: Self-pay | Admitting: Family Medicine

## 2013-09-08 MED ORDER — DICLOFENAC SODIUM 75 MG PO TBEC: DELAYED_RELEASE_TABLET | ORAL | Status: DC

## 2013-09-08 NOTE — Telephone Encounter (Signed)
Anti-inflammatory eRx'd (diclofenac tabs). Tramadol rx printed.

## 2013-09-08 NOTE — Telephone Encounter (Signed)
Last rx for tramadol was 08/24/13.  Please advise refill of tramadol and low dose antiinflammatory.

## 2013-09-08 NOTE — Telephone Encounter (Signed)
Patient is in a lot of pain from broken ribs. Can she get refill of pain meds & also she is requesting a low dose antiinflammatory med. She is requesting a low dose because sometimes the antiinflammatory medications will "mess" with her stomach.

## 2013-09-09 NOTE — Telephone Encounter (Signed)
LMOM stating medications have been sent to pharmacy.

## 2013-09-10 DIAGNOSIS — M6281 Muscle weakness (generalized): Secondary | ICD-10-CM

## 2013-09-10 DIAGNOSIS — IMO0002 Reserved for concepts with insufficient information to code with codable children: Secondary | ICD-10-CM

## 2013-09-10 DIAGNOSIS — IMO0001 Reserved for inherently not codable concepts without codable children: Secondary | ICD-10-CM

## 2013-09-10 DIAGNOSIS — R42 Dizziness and giddiness: Secondary | ICD-10-CM

## 2013-09-17 ENCOUNTER — Ambulatory Visit (INDEPENDENT_AMBULATORY_CARE_PROVIDER_SITE_OTHER): Payer: Medicare Other | Admitting: Family Medicine

## 2013-09-17 ENCOUNTER — Encounter: Payer: Self-pay | Admitting: Family Medicine

## 2013-09-17 VITALS — BP 134/84 | HR 81 | Temp 98.4°F | Resp 18 | Ht 65.0 in | Wt 162.0 lb

## 2013-09-17 DIAGNOSIS — E785 Hyperlipidemia, unspecified: Secondary | ICD-10-CM

## 2013-09-17 DIAGNOSIS — Z0000 Encounter for general adult medical examination without abnormal findings: Secondary | ICD-10-CM

## 2013-09-17 DIAGNOSIS — Z1239 Encounter for other screening for malignant neoplasm of breast: Secondary | ICD-10-CM

## 2013-09-17 DIAGNOSIS — Z1211 Encounter for screening for malignant neoplasm of colon: Secondary | ICD-10-CM

## 2013-09-17 DIAGNOSIS — S2249XA Multiple fractures of ribs, unspecified side, initial encounter for closed fracture: Secondary | ICD-10-CM

## 2013-09-17 DIAGNOSIS — S2239XA Fracture of one rib, unspecified side, initial encounter for closed fracture: Secondary | ICD-10-CM

## 2013-09-17 DIAGNOSIS — Z23 Encounter for immunization: Secondary | ICD-10-CM

## 2013-09-17 DIAGNOSIS — E039 Hypothyroidism, unspecified: Secondary | ICD-10-CM

## 2013-09-17 LAB — LIPID PANEL
Cholesterol: 129 mg/dL (ref 0–200)
HDL: 49.3 mg/dL (ref >39.00)
LDL Cholesterol: 64 mg/dL (ref 0–99)
Total CHOL/HDL Ratio: 3
Triglycerides: 77 mg/dL (ref 0.0–149.0)
VLDL: 15.4 mg/dL (ref 0.0–40.0)

## 2013-09-17 LAB — TSH: TSH: 1.67 u[IU]/mL (ref 0.35–5.50)

## 2013-09-17 LAB — AST: AST: 20 U/L (ref 0–37)

## 2013-09-17 LAB — ALT: ALT: 17 U/L (ref 0–35)

## 2013-09-17 NOTE — Progress Notes (Signed)
Pre visit review using our clinic review tool, if applicable. No additional management support is needed unless otherwise documented below in the visit note. 

## 2013-09-17 NOTE — Progress Notes (Addendum)
OFFICE NOTE  09/17/2013  CC:  Chief Complaint  Patient presents with  . Follow-up    fasting     HPI: Patient is a 80 y.o. Caucasian female who is here for 6 mo f/u hyperlipidemia and hypothyroidism. Right rib fractures from a fall about 1 month ago.  Ribs beginning to feel a lot better, actually slept on right side for the last 2 nights and was fine. Has been taking voltaren lately, no tramadol last couple days.  Using IS still.  Breathing feels normal. Constipation is easing up since being off vicodin. Appetite has been down since rib fxs and she has lost wt.  She feels like things are improving a lot on this front now.  Compliant with chronic meds w/out side effects.  Pertinent PMH:  Past medical, surgical, social, and family history reviewed and changes are noted.  MEDS:  Outpatient Prescriptions Prior to Visit  Medication Sig Dispense Refill  . acetaminophen (TYLENOL) 500 MG tablet Take 500-1,000 mg by mouth every 6 (six) hours as needed for headache.       Marland Kitchen aspirin 81 MG tablet Take 81 mg by mouth every morning.       Marland Kitchen atorvastatin (LIPITOR) 10 MG tablet Take 10 mg by mouth at bedtime.       . diclofenac (VOLTAREN) 75 MG EC tablet 1/2-1 tab po bid prn musculoskeletal pain  30 tablet  1  . esomeprazole (NEXIUM) 40 MG capsule Take 40 mg by mouth every morning.      Marland Kitchen ketotifen (ZADITOR) 0.025 % ophthalmic solution Place 1 drop into both eyes daily as needed (for irritation.).      Marland Kitchen levothyroxine (SYNTHROID, LEVOTHROID) 88 MCG tablet Take 1 tablet (88 mcg total) by mouth daily.  30 tablet  3  . LORazepam (ATIVAN) 1 MG tablet Take 0.5-1 mg by mouth every 8 (eight) hours as needed for anxiety.      Marland Kitchen lubiprostone (AMITIZA) 8 MCG capsule Take 1 capsule (8 mcg total) by mouth 2 (two) times daily with a meal.  60 capsule  3  . solifenacin (VESICARE) 5 MG tablet Take 1 tablet (5 mg total) by mouth daily.  90 tablet  1  . traMADol (ULTRAM) 50 MG tablet 1-2 tabs po q6h prn pain  30  tablet  1   No facility-administered medications prior to visit.    PE: Blood pressure 134/84, pulse 81, temperature 98.4 F (36.9 C), temperature source Temporal, resp. rate 18, height _0  (1.651 m), weight 162 lb (73.483 kg), SpO2 97.00%. Gen: Alert, well appearing.  Patient is oriented to person, place, time, and situation. AFFECT: pleasant, lucid thought and speech. IEP:PIRJ: no injection, icteris, swelling, or exudate.  EOMI, PERRLA. Mouth: lips without lesion/swelling.  Oral mucosa pink and moist. Oropharynx without erythema, exudate, or swelling.  Neck - No masses or thyromegaly or limitation in range of motion CV: RRR, no m/r/g.   LUNGS: CTA bilat, nonlabored resps, good aeration in all lung fields. Mild left sided chest wall TTP.  No crepitus.  IMPRESSION AND PLAN:  Hyperlipidemia Doing fine on statin. FLP today.  Hypothyroidism Due for TSH today.  Rib fractures Improved significantly.   Colon cancer screening Discussed options. She declined colonoscopy but was agreeable to iFOB today so kit was given to pt today.  Breast cancer screening We got the report from [facility] hosp radiology from her 02/2013 mammogram and it was normal.  Preventative health care Tdap today.    An After Visit  Summary was printed and given to the patient.  FOLLOW UP: 6 mo

## 2013-09-18 MED ORDER — LEVOTHYROXINE SODIUM 88 MCG PO TABS: 88.0000 ug | ORAL_TABLET | Freq: Every day | ORAL | Status: DC

## 2013-09-21 ENCOUNTER — Telehealth: Payer: Self-pay | Admitting: Family Medicine

## 2013-09-21 MED ORDER — HYDROCODONE-HOMATROPINE 5-1.5 MG/5ML PO SYRP: ORAL_SOLUTION | ORAL | Status: DC

## 2013-09-21 NOTE — Telephone Encounter (Signed)
Patient aware.  Rx is up front for pt pick up.

## 2013-09-21 NOTE — Telephone Encounter (Signed)
OK.  Hycodan susp rx printed.

## 2013-09-21 NOTE — Telephone Encounter (Signed)
Patient has developed a cough in the last few days and it is disrupting her sleep and hurting her ribs.  Patient would like to know if you could refill her hydrocodone cough syrup.  Please advise refill.

## 2013-09-28 ENCOUNTER — Encounter: Payer: Self-pay | Admitting: Family Medicine

## 2013-09-28 ENCOUNTER — Other Ambulatory Visit: Payer: Self-pay | Admitting: Family Medicine

## 2013-09-28 DIAGNOSIS — Z1239 Encounter for other screening for malignant neoplasm of breast: Secondary | ICD-10-CM | POA: Insufficient documentation

## 2013-09-28 DIAGNOSIS — Z0000 Encounter for general adult medical examination without abnormal findings: Secondary | ICD-10-CM | POA: Insufficient documentation

## 2013-09-28 DIAGNOSIS — Z1211 Encounter for screening for malignant neoplasm of colon: Secondary | ICD-10-CM | POA: Insufficient documentation

## 2013-09-28 NOTE — Assessment & Plan Note (Signed)
Due for TSH today

## 2013-09-28 NOTE — Assessment & Plan Note (Signed)
Discussed options. She declined colonoscopy but was agreeable to iFOB today so kit was given to pt today.

## 2013-09-28 NOTE — Assessment & Plan Note (Signed)
Improved significantly. 

## 2013-09-28 NOTE — Assessment & Plan Note (Signed)
We got the report from [place] radiology from her 02/2013 mammogram and it was normal.

## 2013-09-28 NOTE — Assessment & Plan Note (Signed)
Doing fine on statin. FLP today.

## 2013-09-28 NOTE — Assessment & Plan Note (Signed)
Tdap today

## 2013-09-30 NOTE — Telephone Encounter (Signed)
Tatti from [facility] called to let you know patient has been discharged. She has met all goals for home health care services.

## 2013-10-05 ENCOUNTER — Other Ambulatory Visit: Payer: Self-pay | Admitting: Family Medicine

## 2013-10-16 ENCOUNTER — Other Ambulatory Visit (INDEPENDENT_AMBULATORY_CARE_PROVIDER_SITE_OTHER): Payer: Medicare Other

## 2013-10-16 DIAGNOSIS — Z1211 Encounter for screening for malignant neoplasm of colon: Secondary | ICD-10-CM

## 2013-10-16 LAB — FECAL OCCULT BLOOD, IMMUNOCHEMICAL: FECAL OCCULT BLD: NEGATIVE

## 2013-10-21 ENCOUNTER — Encounter: Payer: Self-pay | Admitting: Family Medicine

## 2013-11-03 HISTORY — PX: OTHER SURGICAL HISTORY: SHX169

## 2013-11-18 ENCOUNTER — Other Ambulatory Visit: Payer: Self-pay | Admitting: Family Medicine

## 2013-11-19 ENCOUNTER — Ambulatory Visit (INDEPENDENT_AMBULATORY_CARE_PROVIDER_SITE_OTHER): Payer: Medicare Other | Admitting: Family Medicine

## 2013-11-19 ENCOUNTER — Encounter: Payer: Self-pay | Admitting: Family Medicine

## 2013-11-19 VITALS — BP 138/56 | HR 79 | Temp 98.4°F | Resp 18 | Ht 65.0 in | Wt 164.0 lb

## 2013-11-19 DIAGNOSIS — T148 Other injury of unspecified body region: Secondary | ICD-10-CM

## 2013-11-19 DIAGNOSIS — R059 Cough, unspecified: Secondary | ICD-10-CM | POA: Insufficient documentation

## 2013-11-19 DIAGNOSIS — W57XXXA Bitten or stung by nonvenomous insect and other nonvenomous arthropods, initial encounter: Secondary | ICD-10-CM | POA: Insufficient documentation

## 2013-11-19 DIAGNOSIS — R05 Cough: Secondary | ICD-10-CM

## 2013-11-19 MED ORDER — BENZONATATE 100 MG PO CAPS: ORAL_CAPSULE | ORAL | Status: DC

## 2013-11-19 NOTE — Progress Notes (Signed)
Pre visit review using our clinic review tool, if applicable. No additional management support is needed unless otherwise documented below in the visit note. 

## 2013-11-19 NOTE — Patient Instructions (Signed)
Buy small box of generic OTC allegra (fexofenadine) 60mg , take one every night for at least one week to see if this helps to alleviate night-time cough.

## 2013-11-19 NOTE — Progress Notes (Signed)
OFFICE NOTE  11/19/2013  CC:  Chief Complaint  Patient presents with  . Tick Removal    Tick Bite, Monday morning   HPI: Patient is a 80 y.o. Caucasian female who is here for tick bite. Four days ago found a tick on back of left thigh/glut area.  She pulled it off.  The area hurt and stayed red until 2 d/a. Still pinkish color where tick was.  No fevers, HA, muscle aches, joint aches, or rashes.  Feels well.  Has night time cough, very dry, keeping her from sleeping sometimes---not an every night thing.  Has tickle in throat during daytime but not at night.  Uses humidifier, has tried delsym and robitussin and no help.  Hycodan in the past has helped.  Denies feeling of suffocating or SOB/wheezing.  Pertinent PMH:  Past medical, surgical, social, and family history reviewed and no changes are noted since last office visit.  MEDS:  Outpatient Prescriptions Prior to Visit  Medication Sig Dispense Refill  . acetaminophen (TYLENOL) 500 MG tablet Take 500-1,000 mg by mouth every 6 (six) hours as needed for headache.       . AMITIZA 8 MCG capsule TAKE 1 CAPSULE (8 MCG TOTAL) BY MOUTH 2 (TWO) TIMES DAILY WITH A MEAL.  60 capsule  3  . aspirin 81 MG tablet Take 81 mg by mouth every morning.       Marland Kitchen atorvastatin (LIPITOR) 10 MG tablet Take 10 mg by mouth at bedtime.       Marland Kitchen esomeprazole (NEXIUM) 40 MG capsule Take 40 mg by mouth every morning.      Marland Kitchen HYDROcodone-homatropine (HYCODAN) 5-1.5 MG/5ML syrup 1 tsp po q6h prn cough (watch for drowsiness)  120 mL  0  . ketotifen (ZADITOR) 0.025 % ophthalmic solution Place 1 drop into both eyes daily as needed (for irritation.).      Marland Kitchen levothyroxine (SYNTHROID, LEVOTHROID) 88 MCG tablet Take 1 tablet (88 mcg total) by mouth daily.  90 tablet  3  . LORazepam (ATIVAN) 1 MG tablet Take 0.5-1 mg by mouth every 8 (eight) hours as needed for anxiety.      . VESICARE 5 MG tablet TAKE 1 TABLET (5 MG TOTAL) BY MOUTH DAILY.  90 tablet  1  . diclofenac (VOLTAREN)  75 MG EC tablet 1/2-1 tab po bid prn musculoskeletal pain  30 tablet  1  . traMADol (ULTRAM) 50 MG tablet 1-2 tabs po q6h prn pain  30 tablet  1   No facility-administered medications prior to visit.    PE: Blood pressure 138/56, pulse 79, temperature 98.4 F (36.9 C), temperature source Oral, resp. rate 18, height 5\' 5"  (1.651 m), weight 164 lb (74.39 kg), SpO2 96.00%. Pt examined with Quentin Ore, CMA, as chaperone. Gen: Alert, well appearing.  Patient is oriented to person, place, time, and situation. HBZ:JIRC: no injection, icteris, swelling, or exudate.  EOMI, PERRLA. Mouth: lips without lesion/swelling.  Oral mucosa pink and moist. Oropharynx without erythema, exudate, or swelling.  Neck - No masses or thyromegaly or limitation in range of motion CV: RRR, no m/r/g.   LUNGS: CTA bilat, nonlabored resps, good aeration in all lung fields. EXT: no clubbing, cyanosis, or edema.  SKIN: left glut with small pinkish papule c/w insect bit.  No induration, fluctuance, tenderness, warmth, or nodularity.  LAB: none  IMPRESSION AND PLAN:  1) Tick bite, mild local reaction to tick saliva.  Recommended OTC hydrocortisone ointment bid prn. Discussed s/s of tick-borne illness to  watch for after tick bites.  Discussed prevention of tick problems by using OFF spray repellant and checking skin nightly for any new ticks.  2) Night time cough, dry: some mild allergy sx's during day.  Doesn't describe sx's of PNd or GERD. Will try antihistamine hs for at least a week--allegra 60mg  qhs (OTC). Rx'd tessalon perles 100mg , 1-2 qhs prn to use for cough suppression if needed.  An After Visit Summary was printed and given to the patient.  FOLLOW UP: prn

## 2013-12-17 ENCOUNTER — Other Ambulatory Visit: Payer: Self-pay | Admitting: Family Medicine

## 2013-12-18 ENCOUNTER — Other Ambulatory Visit: Payer: Self-pay | Admitting: Family Medicine

## 2014-01-12 ENCOUNTER — Encounter (INDEPENDENT_AMBULATORY_CARE_PROVIDER_SITE_OTHER): Payer: Self-pay | Admitting: Ophthalmology

## 2014-01-17 ENCOUNTER — Other Ambulatory Visit: Payer: Self-pay | Admitting: Family Medicine

## 2014-01-18 NOTE — Telephone Encounter (Signed)
Pt requesting rf of ativan.  Patient last OV was 11/19/13.  I don't see when the last time it was rx'd in chart.  Please advise.

## 2014-02-11 ENCOUNTER — Other Ambulatory Visit: Payer: Self-pay | Admitting: Family Medicine

## 2014-02-16 ENCOUNTER — Other Ambulatory Visit: Payer: Self-pay | Admitting: Family Medicine

## 2014-03-19 ENCOUNTER — Ambulatory Visit: Payer: Medicare Other | Admitting: Family Medicine

## 2014-03-24 ENCOUNTER — Other Ambulatory Visit: Payer: Self-pay | Admitting: Family Medicine

## 2014-03-24 MED ORDER — SOLIFENACIN SUCCINATE 5 MG PO TABS: ORAL_TABLET | ORAL | Status: DC

## 2014-03-26 ENCOUNTER — Ambulatory Visit: Payer: Medicare Other | Admitting: Family Medicine

## 2014-03-30 ENCOUNTER — Other Ambulatory Visit: Payer: Self-pay | Admitting: Family Medicine

## 2014-03-30 MED ORDER — BENZONATATE 100 MG PO CAPS: ORAL_CAPSULE | ORAL | Status: DC

## 2014-03-30 NOTE — Telephone Encounter (Signed)
Pt requesting rf of benzonatate 100mg  caps. Please advise.

## 2014-03-31 ENCOUNTER — Encounter: Payer: Self-pay | Admitting: Family Medicine

## 2014-03-31 ENCOUNTER — Ambulatory Visit (INDEPENDENT_AMBULATORY_CARE_PROVIDER_SITE_OTHER): Payer: Medicare Other | Admitting: Family Medicine

## 2014-03-31 VITALS — BP 128/78 | HR 87 | Temp 97.4°F | Ht 65.0 in | Wt 167.0 lb

## 2014-03-31 DIAGNOSIS — E871 Hypo-osmolality and hyponatremia: Secondary | ICD-10-CM

## 2014-03-31 DIAGNOSIS — E785 Hyperlipidemia, unspecified: Secondary | ICD-10-CM

## 2014-03-31 DIAGNOSIS — E039 Hypothyroidism, unspecified: Secondary | ICD-10-CM

## 2014-03-31 DIAGNOSIS — Z124 Encounter for screening for malignant neoplasm of cervix: Secondary | ICD-10-CM | POA: Insufficient documentation

## 2014-03-31 DIAGNOSIS — F411 Generalized anxiety disorder: Secondary | ICD-10-CM

## 2014-03-31 LAB — BASIC METABOLIC PANEL
BUN: 10 mg/dL (ref 6–23)
CALCIUM: 9.3 mg/dL (ref 8.4–10.5)
CO2: 22 mEq/L (ref 19–32)
CREATININE: 0.8 mg/dL (ref 0.4–1.2)
Chloride: 105 mEq/L (ref 96–112)
GFR: 79.31 mL/min (ref >60.00)
Glucose, Bld: 101 mg/dL — ABNORMAL HIGH (ref 70–99)
Potassium: 4.5 mEq/L (ref 3.5–5.1)
Sodium: 135 mEq/L (ref 135–145)

## 2014-03-31 NOTE — Progress Notes (Signed)
OFFICE NOTE  03/31/2014  CC: Routine f/u  HPI: Patient is a 80 y.o. Caucasian female who is here for 6 mo f/u hyperlipidemia, hypothyroidism, anxiety. Doing pretty well.  Her husband has memory issues that sometimes give her a lot of trouble/anxiety and this is her biggest stressor. She takes lorazepam in morning and at bedtime.  Compliant with chronic meds. Tries to stay fairly active with yard work but otherwise no exercise.  Pertinent PMH:  Past medical, surgical, social, and family history reviewed and no changes are noted since last office visit.  MEDS: She takes 1/2 of a 20mg  lipitor pill once daily Outpatient Prescriptions Prior to Visit  Medication Sig Dispense Refill  . acetaminophen (TYLENOL) 500 MG tablet Take 500-1,000 mg by mouth every 6 (six) hours as needed for headache.       . AMITIZA 8 MCG capsule TAKE 1 CAPSULE (8 MCG TOTAL) BY MOUTH 2 (TWO) TIMES DAILY WITH A MEAL.  60 capsule  3  . aspirin 81 MG tablet Take 81 mg by mouth every morning.       Marland Kitchen atorvastatin (LIPITOR) 10 MG tablet Take 10 mg by mouth at bedtime.       Marland Kitchen atorvastatin (LIPITOR) 20 MG tablet TAKE 1 PILL DAILY OR AS DIRECTED  90 tablet  3  . benzonatate (TESSALON) 100 MG capsule 1-2 tabs po qhs prn cough  30 capsule  2  . esomeprazole (NEXIUM) 40 MG capsule Take 40 mg by mouth every morning.      Marland Kitchen ketotifen (ZADITOR) 0.025 % ophthalmic solution Place 1 drop into both eyes daily as needed (for irritation.).      Marland Kitchen levothyroxine (SYNTHROID, LEVOTHROID) 88 MCG tablet Take 1 tablet (88 mcg total) by mouth daily.  90 tablet  3  . LORazepam (ATIVAN) 1 MG tablet TAKE 1 TABLET BY MOUTH EVERY 8 HOURS  90 tablet  5  . NEXIUM 40 MG capsule TAKE 1 CAPSULE (40 MG TOTAL) BY MOUTH DAILY BEFORE BREAKFAST.  90 capsule  1  . solifenacin (VESICARE) 5 MG tablet TAKE 1 TABLET (5 MG TOTAL) BY MOUTH DAILY.  90 tablet  1  . AMITIZA 8 MCG capsule TAKE 1 CAPSULE (8 MCG TOTAL) BY MOUTH 2 (TWO) TIMES DAILY WITH A MEAL.  60 capsule   3  . AMITIZA 8 MCG capsule TAKE 1 CAPSULE (8 MCG TOTAL) BY MOUTH 2 (TWO) TIMES DAILY WITH A MEAL.  60 capsule  3  . diclofenac (VOLTAREN) 75 MG EC tablet 1/2-1 tab po bid prn musculoskeletal pain  30 tablet  1  . NEXIUM 40 MG capsule TAKE 1 CAPSULE (40 MG TOTAL) BY MOUTH DAILY BEFORE BREAKFAST.  90 capsule  1  . NEXIUM 40 MG capsule TAKE 1 CAPSULE (40 MG TOTAL) BY MOUTH DAILY BEFORE BREAKFAST.  90 capsule  1  . traMADol (ULTRAM) 50 MG tablet 1-2 tabs po q6h prn pain  30 tablet  1   No facility-administered medications prior to visit.    PE: Blood pressure 128/78, pulse 87, temperature 97.4 F (36.3 C), temperature source Temporal, height 5\' 5"  (1.651 m), weight 167 lb (75.751 kg), SpO2 96.00%. Gen: Alert, well appearing.  Patient is oriented to person, place, time, and situation. No further exam today.   IMPRESSION AND PLAN:  1) Hyperlipidemia: The current medical regimen is effective;  continue present plan and medications. Recheck FLP next f/u in 41mo.  2) Hypothyroidism; The current medical regimen is effective;  continue present plan and medications.  Recheck TSH next f/u in 6 mo.  3) Anxiety; The current medical regimen is effective;  continue present plan and medications.  4) Mild hyponatremia on past BMETs.  Unknown etiology. Recheck BMET today.  5) Cancer screening: we reviewed this today.   In the future, we'll do annual iFOB's on her for colon cancer screening (next is due 10/2014). We'll continue with annual mammograms for now. We'll d/c cervical cancer screening now (no hx of abnormal pap smear, most recent pap was 2 yrs ago).  An After Visit Summary was printed and given to the patient.  FOLLOW UP: 6 mo, fasting

## 2014-03-31 NOTE — Progress Notes (Signed)
Pre visit review using our clinic review tool, if applicable. No additional management support is needed unless otherwise documented below in the visit note. 

## 2014-04-06 ENCOUNTER — Ambulatory Visit: Payer: Medicare Other | Admitting: Family Medicine

## 2014-04-12 ENCOUNTER — Encounter (INDEPENDENT_AMBULATORY_CARE_PROVIDER_SITE_OTHER): Payer: Medicare Other | Admitting: Ophthalmology

## 2014-04-12 DIAGNOSIS — H3531 Nonexudative age-related macular degeneration: Secondary | ICD-10-CM | POA: Diagnosis not present

## 2014-04-12 DIAGNOSIS — H43813 Vitreous degeneration, bilateral: Secondary | ICD-10-CM | POA: Diagnosis not present

## 2014-04-12 DIAGNOSIS — D3131 Benign neoplasm of right choroid: Secondary | ICD-10-CM

## 2014-05-31 ENCOUNTER — Other Ambulatory Visit: Payer: Self-pay | Admitting: Family Medicine

## 2014-05-31 MED ORDER — LUBIPROSTONE 8 MCG PO CAPS: ORAL_CAPSULE | ORAL | Status: DC

## 2014-08-02 ENCOUNTER — Encounter: Payer: Self-pay | Admitting: Family Medicine

## 2014-08-02 MED ORDER — LORAZEPAM 1 MG PO TABS: 1.0000 mg | ORAL_TABLET | Freq: Three times a day (TID) | ORAL | Status: DC

## 2014-08-02 NOTE — Telephone Encounter (Signed)
Lorazepam rx printed.  Pls fax to pt's pharmacy-thx!

## 2014-08-17 ENCOUNTER — Other Ambulatory Visit: Payer: Self-pay | Admitting: Family Medicine

## 2014-08-17 MED ORDER — ESOMEPRAZOLE MAGNESIUM 40 MG PO CPDR: DELAYED_RELEASE_CAPSULE | ORAL | Status: DC

## 2014-09-01 ENCOUNTER — Telehealth: Payer: Self-pay | Admitting: Family Medicine

## 2014-09-01 NOTE — Telephone Encounter (Signed)
Please advise 

## 2014-09-01 NOTE — Telephone Encounter (Signed)
OK to use clotrimazole as previously prescribed.  Ask if pt needs new rx.-thx

## 2014-09-01 NOTE — Telephone Encounter (Signed)
Pt. 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 She states this is a recurring problem a couple times of the year. She has started to clean it with alcohol. She has a RX of clotrimazole that Dr. Laurance Flatten had given her in the past for this condition. She would like direction as what to do to make this better. What would Dr. Anitra Lauth recommend she do?  Please call her at [phone].

## 2014-09-01 NOTE — Telephone Encounter (Signed)
LMOVM for pt to return call 

## 2014-09-02 NOTE — Telephone Encounter (Signed)
Patient advised.  She would like a new Rx.  She is getting low on the cream and it's from a few years ago.

## 2014-09-03 ENCOUNTER — Other Ambulatory Visit: Payer: Self-pay | Admitting: Family Medicine

## 2014-09-03 MED ORDER — CLOTRIMAZOLE 1 % EX CREA: TOPICAL_CREAM | CUTANEOUS | Status: DC

## 2014-09-03 NOTE — Telephone Encounter (Signed)
Clotrimazole rx sent.

## 2014-09-14 ENCOUNTER — Other Ambulatory Visit: Payer: Self-pay | Admitting: *Deleted

## 2014-09-14 MED ORDER — LEVOTHYROXINE SODIUM 88 MCG PO TABS: 88.0000 ug | ORAL_TABLET | Freq: Every day | ORAL | Status: DC

## 2014-09-20 ENCOUNTER — Other Ambulatory Visit: Payer: Self-pay

## 2014-09-20 NOTE — Telephone Encounter (Signed)
Error

## 2014-09-27 ENCOUNTER — Other Ambulatory Visit: Payer: Self-pay | Admitting: *Deleted

## 2014-09-27 MED ORDER — LUBIPROSTONE 8 MCG PO CAPS: ORAL_CAPSULE | ORAL | Status: DC

## 2014-09-30 ENCOUNTER — Ambulatory Visit: Payer: Medicare Other | Admitting: Family Medicine

## 2014-10-07 ENCOUNTER — Encounter: Payer: Self-pay | Admitting: Family Medicine

## 2014-10-07 ENCOUNTER — Ambulatory Visit (INDEPENDENT_AMBULATORY_CARE_PROVIDER_SITE_OTHER): Payer: Medicare Other | Admitting: Family Medicine

## 2014-10-07 VITALS — BP 120/80 | HR 74 | Temp 98.4°F | Ht 65.0 in | Wt 171.0 lb

## 2014-10-07 DIAGNOSIS — F410 Panic disorder [episodic paroxysmal anxiety] without agoraphobia: Secondary | ICD-10-CM | POA: Diagnosis not present

## 2014-10-07 DIAGNOSIS — E785 Hyperlipidemia, unspecified: Secondary | ICD-10-CM

## 2014-10-07 DIAGNOSIS — E039 Hypothyroidism, unspecified: Secondary | ICD-10-CM

## 2014-10-07 DIAGNOSIS — K219 Gastro-esophageal reflux disease without esophagitis: Secondary | ICD-10-CM | POA: Diagnosis not present

## 2014-10-07 DIAGNOSIS — F411 Generalized anxiety disorder: Secondary | ICD-10-CM

## 2014-10-07 MED ORDER — CITALOPRAM HYDROBROMIDE 20 MG PO TABS: 20.0000 mg | ORAL_TABLET | Freq: Every day | ORAL | Status: DC

## 2014-10-07 NOTE — Progress Notes (Signed)
Pre visit review using our clinic review tool, if applicable. No additional management support is needed unless otherwise documented below in the visit note. 

## 2014-10-07 NOTE — Patient Instructions (Signed)
Buy OTC generic zantac 150mg  and take one tab every 12 hours as needed for any upper abd pains that last more than 30 min.  Continue all current meds.

## 2014-10-07 NOTE — Progress Notes (Signed)
OFFICE NOTE  10/07/2014  CC:  Chief Complaint  Patient presents with  . Follow-up    6 months   HPI: Patient is a 81 y.o. Caucasian female who is here for 6 mo f/u hyperlipidemia, GERD, hypothyroidism. However, she is not completely fasting today. Reports that occasionally she has brief bouts of breathlessness feeling in chest, left shoulder/left neck discomfort that pt has a lot of difficulty describing, also some pain in upper abdomen area in middle--usually shortly after she gets up in morning.  These have been happening on/off for several years. She does a few deep breaths, coughs a couple times, takes a baby aspirin.  Gets very scared when it is happening.  Gets mild dizziness.  No palpitations or sense of heart racing.  These episodes last 10 min.  The sx's are NEVER brought on by exertion such as when she works in her yard. Most recent episode about 2 days ago was different in that her epigastric pain lasted all day. She is under more stress taking care of her husband who's health is failing more lately.   Takes ativan bid, rarely tid.  Has never been on an antidepressant in the past. Lately has had more feeling of depression/hopelessness, overly worried with her husband's problems.  Has remote hx of ETT, echo: all normal.  Pt was not having any symptoms at that time but says she was told by her PCP she had a bruit (?neck?-assuming yes) and perhaps this led to the testing.  All testing normal.   Pertinent PMH:  Past medical, surgical, social, and family history reviewed and no changes are noted since last office visit.  MEDS:  Outpatient Prescriptions Prior to Visit  Medication Sig Dispense Refill  . acetaminophen (TYLENOL) 500 MG tablet Take 500-1,000 mg by mouth every 6 (six) hours as needed for headache.     Marland Kitchen aspirin 81 MG tablet Take 81 mg by mouth every morning.     Marland Kitchen atorvastatin (LIPITOR) 20 MG tablet TAKE 1 PILL DAILY OR AS DIRECTED 90 tablet 3  . clotrimazole  (LOTRIMIN) 1 % cream Apply to affected area/rash in belly button area twice a day until rash is completely gone for 3 consecutive days. 45 g 1  . esomeprazole (NEXIUM) 40 MG capsule TAKE 1 CAPSULE (40 MG TOTAL) BY MOUTH DAILY BEFORE BREAKFAST. 90 capsule 1  . ketotifen (ZADITOR) 0.025 % ophthalmic solution Place 1 drop into both eyes daily as needed (for irritation.).    Marland Kitchen levothyroxine (SYNTHROID, LEVOTHROID) 88 MCG tablet Take 1 tablet (88 mcg total) by mouth daily. 90 tablet 3  . LORazepam (ATIVAN) 1 MG tablet Take 1 tablet (1 mg total) by mouth every 8 (eight) hours. 90 tablet 5  . lubiprostone (AMITIZA) 8 MCG capsule TAKE 1 CAPSULE (8 MCG TOTAL) BY MOUTH 2 (TWO) TIMES DAILY WITH A MEAL. 60 capsule 3  . solifenacin (VESICARE) 5 MG tablet TAKE 1 TABLET (5 MG TOTAL) BY MOUTH DAILY. 90 tablet 1  . benzonatate (TESSALON) 100 MG capsule 1-2 tabs po qhs prn cough (Patient not taking: Reported on 10/07/2014) 30 capsule 2  . esomeprazole (NEXIUM) 40 MG capsule Take 40 mg by mouth every morning.     No facility-administered medications prior to visit.    PE: Blood pressure 120/80, pulse 74, temperature 98.4 F (36.9 C), temperature source Oral, height 5\' 5"  (1.651 m), weight 171 lb (77.565 kg), SpO2 98 %. Gen: Alert, well appearing.  Patient is oriented to person, place, time, and  situation. KRC:VKFM: no injection, icteris, swelling, or exudate.  EOMI, PERRLA. Mouth: lips without lesion/swelling.  Oral mucosa pink and moist. Oropharynx without erythema, exudate, or swelling.  Neck: supple/nontender.  No LAD, mass, or TM.  Carotid pulses 2+ bilaterally, without bruits. CV: RRR, no m/r/g.   LUNGS: CTA bilat, nonlabored resps, good aeration in all lung fields. No bruit in supra or infra clavicular areas.  Radial pulses 2+ bilat. ABD: soft, NT, ND, BS normal.  No hepatospenomegaly or mass.  No bruits. EXT: no clubbing, cyanosis, or edema.    Lab Results  Component Value Date   TSH 1.67 09/17/2013    Lab Results  Component Value Date   CHOL 129 09/17/2013   HDL 49.30 09/17/2013   LDLCALC 64 09/17/2013   TRIG 77.0 09/17/2013   CHOLHDL 3 09/17/2013     Chemistry      Component Value Date/Time   NA 135 03/31/2014 1339   K 4.5 03/31/2014 1339   CL 105 03/31/2014 1339   CO2 22 03/31/2014 1339   BUN 10 03/31/2014 1339   CREATININE 0.8 03/31/2014 1339      Component Value Date/Time   CALCIUM 9.3 03/31/2014 1339   ALKPHOS 89 09/18/2012 1103   AST 20 09/17/2013 0951   ALT 17 09/17/2013 0951   BILITOT 0.6 09/18/2012 1103     Lab Results  Component Value Date   WBC 8.4 08/31/2013   HGB 14.2 08/31/2013   HCT 41.1 08/31/2013   MCV 91.1 08/31/2013   PLT 322 08/31/2013    IMPRESSION AND PLAN:  1) GAD w/panic attacks.   I have very low suspicion that any of these sx's are cardiopulmonary in nature/etiology. Discussed in depth with pt today. Decided on trial of citalopram 20mg  qd, continue all current meds. Add zantac 150mg  otc q12 prn for any dyspepsia that lasts >30 min.  2) Hyperlipid/GERD/hypothyroidism: all stable but she is not fasting today. We'll do fasting HP at f/u in 4 wks.  An After Visit Summary was printed and given to the patient.  FOLLOW UP: 4 wks

## 2014-11-04 ENCOUNTER — Encounter: Payer: Self-pay | Admitting: Family Medicine

## 2014-11-04 ENCOUNTER — Ambulatory Visit (INDEPENDENT_AMBULATORY_CARE_PROVIDER_SITE_OTHER): Payer: Medicare Other | Admitting: Family Medicine

## 2014-11-04 VITALS — BP 133/82 | HR 72 | Temp 98.0°F | Resp 16 | Wt 170.0 lb

## 2014-11-04 DIAGNOSIS — F410 Panic disorder [episodic paroxysmal anxiety] without agoraphobia: Secondary | ICD-10-CM

## 2014-11-04 DIAGNOSIS — F411 Generalized anxiety disorder: Secondary | ICD-10-CM

## 2014-11-04 DIAGNOSIS — E039 Hypothyroidism, unspecified: Secondary | ICD-10-CM | POA: Diagnosis not present

## 2014-11-04 DIAGNOSIS — E785 Hyperlipidemia, unspecified: Secondary | ICD-10-CM | POA: Diagnosis not present

## 2014-11-04 LAB — COMPREHENSIVE METABOLIC PANEL
ALBUMIN: 3.8 g/dL (ref 3.5–5.2)
ALK PHOS: 90 U/L (ref 39–117)
ALT: 14 U/L (ref 0–35)
AST: 16 U/L (ref 0–37)
BUN: 7 mg/dL (ref 6–23)
CO2: 27 mEq/L (ref 19–32)
Calcium: 9.1 mg/dL (ref 8.4–10.5)
Chloride: 104 mEq/L (ref 96–112)
Creatinine, Ser: 0.9 mg/dL (ref 0.40–1.20)
GFR: 64.16 mL/min (ref >60.00)
GLUCOSE: 103 mg/dL — AB (ref 70–99)
POTASSIUM: 4.5 meq/L (ref 3.5–5.1)
Sodium: 137 mEq/L (ref 135–145)
Total Bilirubin: 0.5 mg/dL (ref 0.2–1.2)
Total Protein: 6.5 g/dL (ref 6.0–8.3)

## 2014-11-04 LAB — TSH: TSH: 1.84 u[IU]/mL (ref 0.35–4.50)

## 2014-11-04 LAB — LIPID PANEL
Cholesterol: 152 mg/dL (ref 0–200)
HDL: 45.8 mg/dL (ref >39.00)
LDL CALC: 81 mg/dL (ref 0–99)
NONHDL: 106.2
TRIGLYCERIDES: 128 mg/dL (ref 0.0–149.0)
Total CHOL/HDL Ratio: 3
VLDL: 25.6 mg/dL (ref 0.0–40.0)

## 2014-11-04 NOTE — Progress Notes (Signed)
Pre visit review using our clinic review tool, if applicable. No additional management support is needed unless otherwise documented below in the visit note. 

## 2014-11-04 NOTE — Progress Notes (Signed)
OFFICE VISIT  11/04/2014   CC:  Chief Complaint  Patient presents with  . Follow-up    4 week f/u     HPI:    Patient is a 81 y.o. Caucasian female who presents for 1 mo f/u anxiety. Also needs routine labs today b/c she was not fasting at last visit when we planned these. Started citalopram 20mg  qd last visit.  Had lots of nausea and took this 4d and stopped.  Has had only one panic attack since I last saw her, wants to see how things go off of antidepressant for a while.   Still on nexium for GERD, never got the zantac we talked about last visit--says dyspepsia sx's not a problem lately.    Past Medical History  Diagnosis Date  . Hyperlipidemia, mixed   . Diverticulosis     Diverticulitis (1982)  . Vertigo   . Anxiety   . GERD (gastroesophageal reflux disease)   . Hypothyroidism   . Urine incontinence   . Ulcer   . Shingles 1967 and 2010  . IBS (irritable bowel syndrome)   . Right lumbar radiculopathy     Intermittent x 2 episodes in summer 2014  . Fracture of rib of right side 08/16/2013    Past Surgical History  Procedure Laterality Date  . Tubal ligation  1974  . Tonsillectomy and adenoidectomy  1948  . Foot neuroma surgery  1994    right foot  . Wisdom tooth extraction    . Colonoscopy  2005    Dr. Arsenio Loader at [facility] (normal per pt report)  . Cataract extraction  2012    Dr. Herbert Deaner    Outpatient Prescriptions Prior to Visit  Medication Sig Dispense Refill  . acetaminophen (TYLENOL) 500 MG tablet Take 500-1,000 mg by mouth every 6 (six) hours as needed for headache.     Marland Kitchen aspirin 81 MG tablet Take 81 mg by mouth every morning.     Marland Kitchen atorvastatin (LIPITOR) 20 MG tablet TAKE 1 PILL DAILY OR AS DIRECTED 90 tablet 3  . clotrimazole (LOTRIMIN) 1 % cream Apply to affected area/rash in belly button area twice a day until rash is completely gone for 3 consecutive days. 45 g 1  . esomeprazole (NEXIUM) 40 MG capsule TAKE 1 CAPSULE (40 MG TOTAL) BY MOUTH DAILY BEFORE  BREAKFAST. 90 capsule 1  . ketotifen (ZADITOR) 0.025 % ophthalmic solution Place 1 drop into both eyes daily as needed (for irritation.).    Marland Kitchen levothyroxine (SYNTHROID, LEVOTHROID) 88 MCG tablet Take 1 tablet (88 mcg total) by mouth daily. 90 tablet 3  . LORazepam (ATIVAN) 1 MG tablet Take 1 tablet (1 mg total) by mouth every 8 (eight) hours. 90 tablet 5  . lubiprostone (AMITIZA) 8 MCG capsule TAKE 1 CAPSULE (8 MCG TOTAL) BY MOUTH 2 (TWO) TIMES DAILY WITH A MEAL. 60 capsule 3  . solifenacin (VESICARE) 5 MG tablet TAKE 1 TABLET (5 MG TOTAL) BY MOUTH DAILY. 90 tablet 1  . citalopram (CELEXA) 20 MG tablet Take 1 tablet (20 mg total) by mouth daily. (Patient not taking: Reported on 11/04/2014) 30 tablet 1   No facility-administered medications prior to visit.    Allergies  Allergen Reactions  . Codeine Nausea And Vomiting    ROS As per HPI  PE: Blood pressure 133/82, pulse 72, temperature 98 F (36.7 C), temperature source Oral, resp. rate 16, weight 170 lb (77.111 kg), SpO2 96 %. Gen: Alert, well appearing.  Patient is oriented to person,  place, time, and situation. AFFECT: pleasant, lucid thought and speech. No further exam today.  LABS:  Lab Results  Component Value Date   TSH 1.67 09/17/2013   Lab Results  Component Value Date   WBC 8.4 08/31/2013   HGB 14.2 08/31/2013   HCT 41.1 08/31/2013   MCV 91.1 08/31/2013   PLT 322 08/31/2013   Lab Results  Component Value Date   CREATININE 0.8 03/31/2014   BUN 10 03/31/2014   NA 135 03/31/2014   K 4.5 03/31/2014   CL 105 03/31/2014   CO2 22 03/31/2014   Lab Results  Component Value Date   ALT 17 09/17/2013   AST 20 09/17/2013   ALKPHOS 89 09/18/2012   BILITOT 0.6 09/18/2012   Lab Results  Component Value Date   CHOL 129 09/17/2013   Lab Results  Component Value Date   HDL 49.30 09/17/2013   Lab Results  Component Value Date   LDLCALC 64 09/17/2013   Lab Results  Component Value Date   TRIG 77.0 09/17/2013    Lab Results  Component Value Date   CHOLHDL 3 09/17/2013     IMPRESSION AND PLAN:  1) GAD, panic attacks: seems to be doing much better/coping better since I saw her last. Did not tolerate trial of citalopram.  She wants to take a watchful waiting approach at this time. She has lorazepam to use on a prn basis. She'll call/return if significant anxiety problems arise again.  2) Hypothyroidism, hyperlipidemia: she'll get CMET, FLP, and TSH today.  An After Visit Summary was printed and given to the patient.  FOLLOW UP: Return in about 6 months (around 05/07/2015) for routine chronic illness f/u.

## 2015-01-27 ENCOUNTER — Other Ambulatory Visit: Payer: Self-pay | Admitting: *Deleted

## 2015-01-27 MED ORDER — LUBIPROSTONE 8 MCG PO CAPS: ORAL_CAPSULE | ORAL | Status: DC

## 2015-01-27 NOTE — Telephone Encounter (Signed)
RF request for amitiza LOV: 11/04/14 Next ov: 04/08/15 Last written: 09/27/14 #60 w/ 3RF

## 2015-01-28 ENCOUNTER — Other Ambulatory Visit: Payer: Self-pay | Admitting: *Deleted

## 2015-01-28 MED ORDER — LORAZEPAM 1 MG PO TABS: 1.0000 mg | ORAL_TABLET | Freq: Three times a day (TID) | ORAL | Status: DC

## 2015-01-28 NOTE — Telephone Encounter (Signed)
RF request for lorazepam LOV: 11/04/14 Next ov: 04/08/15 Last written: 08/02/14 #90 w/ 5RF Please advise. Thanks.

## 2015-01-28 NOTE — Telephone Encounter (Signed)
Rx faxed

## 2015-02-08 DIAGNOSIS — R079 Chest pain, unspecified: Secondary | ICD-10-CM

## 2015-02-08 HISTORY — PX: CARDIOVASCULAR STRESS TEST: SHX262

## 2015-02-08 HISTORY — DX: Chest pain, unspecified: R07.9

## 2015-02-09 ENCOUNTER — Encounter: Payer: Self-pay | Admitting: Family Medicine

## 2015-02-16 ENCOUNTER — Other Ambulatory Visit: Payer: Self-pay | Admitting: *Deleted

## 2015-02-16 MED ORDER — ESOMEPRAZOLE MAGNESIUM 40 MG PO CPDR: DELAYED_RELEASE_CAPSULE | ORAL | Status: DC

## 2015-02-16 NOTE — Telephone Encounter (Signed)
RF request for nexium LOV: 11/04/14 Next ov: 04/08/15 Last written: 08/17/14 #90 w/ 1RF

## 2015-03-03 ENCOUNTER — Ambulatory Visit: Payer: Medicare Other | Admitting: Cardiology

## 2015-03-14 ENCOUNTER — Encounter: Payer: Self-pay | Admitting: Family Medicine

## 2015-03-14 ENCOUNTER — Other Ambulatory Visit: Payer: Self-pay | Admitting: *Deleted

## 2015-03-14 MED ORDER — SOLIFENACIN SUCCINATE 5 MG PO TABS: ORAL_TABLET | ORAL | Status: DC

## 2015-03-14 NOTE — Telephone Encounter (Signed)
RF request for vesicare LOV: 11/04/14 Next ov: 04/08/15 Last written: 03/24/14 #90 w/ 1RF

## 2015-04-02 ENCOUNTER — Encounter: Payer: Self-pay | Admitting: Family Medicine

## 2015-04-08 ENCOUNTER — Encounter: Payer: Self-pay | Admitting: Family Medicine

## 2015-04-08 ENCOUNTER — Ambulatory Visit (INDEPENDENT_AMBULATORY_CARE_PROVIDER_SITE_OTHER): Payer: Medicare Other | Admitting: Family Medicine

## 2015-04-08 VITALS — BP 130/79 | HR 75 | Temp 98.3°F | Resp 16 | Ht 65.0 in | Wt 171.0 lb

## 2015-04-08 DIAGNOSIS — F411 Generalized anxiety disorder: Secondary | ICD-10-CM | POA: Diagnosis not present

## 2015-04-08 DIAGNOSIS — R059 Cough, unspecified: Secondary | ICD-10-CM

## 2015-04-08 DIAGNOSIS — E785 Hyperlipidemia, unspecified: Secondary | ICD-10-CM

## 2015-04-08 DIAGNOSIS — R05 Cough: Secondary | ICD-10-CM | POA: Diagnosis not present

## 2015-04-08 DIAGNOSIS — E039 Hypothyroidism, unspecified: Secondary | ICD-10-CM

## 2015-04-08 NOTE — Patient Instructions (Signed)
Buy generic OTC zyrtec and take one tab nightly to see if this helps with cough. May continue to take delsym as well.

## 2015-04-08 NOTE — Progress Notes (Signed)
OFFICE VISIT  04/08/2015   CC:  Chief Complaint  Patient presents with  . Follow-up    Pt is fasting.   HPI:    Patient is a 81 y.o. Caucasian female who presents for 5 mo f/u: anxiety, hypothyroidism, hyperlipidemia. Labs 10/2014 were all good. Doing ok, no new c/o's or needs.  Taking lorazepam tid and coping ok with  Her chronic anxiety. Taking statin daily, no side effects. Takes thyroid med correctly.   Past Medical History  Diagnosis Date  . Hyperlipidemia, mixed   . Diverticulosis     Diverticulitis (1982)  . Vertigo   . Anxiety   . GERD (gastroesophageal reflux disease)   . Hypothyroidism   . Urine incontinence   . Ulcer   . Shingles 1967 and 2010  . IBS (irritable bowel syndrome)   . Right lumbar radiculopathy     Intermittent x 2 episodes in summer 2014  . Fracture of rib of right side 08/16/2013  . Chronic renal insufficiency, stage II (mild)     CrCl 60s  . Chest pain 02/08/15    ETT normal    Past Surgical History  Procedure Laterality Date  . Tubal ligation  1974  . Tonsillectomy and adenoidectomy  1948  . Foot neuroma surgery  1994    right foot  . Wisdom tooth extraction    . Colonoscopy  2005    Dr. Arsenio Loader at [facility] (normal per pt report)  . Cataract extraction  2012    Dr. Herbert Deaner  . Cardiovascular stress test  02/08/15    ETT normal  . Dexa  11/03/13    Heel T score -0.8.    Outpatient Prescriptions Prior to Visit  Medication Sig Dispense Refill  . acetaminophen (TYLENOL) 500 MG tablet Take 500-1,000 mg by mouth every 6 (six) hours as needed for headache.     Marland Kitchen aspirin 81 MG tablet Take 81 mg by mouth every morning.     Marland Kitchen atorvastatin (LIPITOR) 20 MG tablet TAKE 1 PILL DAILY OR AS DIRECTED 90 tablet 3  . clotrimazole (LOTRIMIN) 1 % cream Apply to affected area/rash in belly button area twice a day until rash is completely gone for 3 consecutive days. 45 g 1  . esomeprazole (NEXIUM) 40 MG capsule TAKE 1 CAPSULE (40 MG TOTAL) BY MOUTH DAILY  BEFORE BREAKFAST. 90 capsule 1  . ketotifen (ZADITOR) 0.025 % ophthalmic solution Place 1 drop into both eyes daily as needed (for irritation.).    Marland Kitchen levothyroxine (SYNTHROID, LEVOTHROID) 88 MCG tablet Take 1 tablet (88 mcg total) by mouth daily. 90 tablet 3  . LORazepam (ATIVAN) 1 MG tablet Take 1 tablet (1 mg total) by mouth every 8 (eight) hours. 90 tablet 5  . lubiprostone (AMITIZA) 8 MCG capsule TAKE 1 CAPSULE (8 MCG TOTAL) BY MOUTH 2 (TWO) TIMES DAILY WITH A MEAL. 60 capsule 3  . solifenacin (VESICARE) 5 MG tablet TAKE 1 TABLET (5 MG TOTAL) BY MOUTH DAILY. 90 tablet 3   No facility-administered medications prior to visit.    Allergies  Allergen Reactions  . Codeine Nausea And Vomiting    ROS As per HPI  PE: Blood pressure 130/79, pulse 75, temperature 98.3 F (36.8 C), temperature source Oral, resp. rate 16, height 5\' 5"  (1.651 m), weight 171 lb (77.565 kg), SpO2 96 %. Gen: Alert, well appearing.  Patient is oriented to person, place, time, and situation. CV: RRR, no m/r/g.   LUNGS: CTA bilat, nonlabored resps, good aeration in all  lung fields.   LABS:  Lab Results  Component Value Date   TSH 1.84 11/04/2014   Lab Results  Component Value Date   WBC 8.4 08/31/2013   HGB 14.2 08/31/2013   HCT 41.1 08/31/2013   MCV 91.1 08/31/2013   PLT 322 08/31/2013   Lab Results  Component Value Date   CREATININE 0.90 11/04/2014   BUN 7 11/04/2014   NA 137 11/04/2014   K 4.5 11/04/2014   CL 104 11/04/2014   CO2 27 11/04/2014   Lab Results  Component Value Date   ALT 14 11/04/2014   AST 16 11/04/2014   ALKPHOS 90 11/04/2014   BILITOT 0.5 11/04/2014   Lab Results  Component Value Date   CHOL 152 11/04/2014   Lab Results  Component Value Date   HDL 45.80 11/04/2014   Lab Results  Component Value Date   LDLCALC 81 11/04/2014   Lab Results  Component Value Date   TRIG 128.0 11/04/2014   Lab Results  Component Value Date   CHOLHDL 3 11/04/2014    IMPRESSION  AND PLAN:  1) Anxiety: The current medical regimen is effective;  continue present plan and medications.  2) Hypothyroidism: TSH wnl 10/2014.   Plan recheck next f/u in 57mo.  3) Hyperlipidemia: Lipid panel good 6 mo ago, tolerating statin, LFTs normal. Recheck FLP  6 mo.  4) Night time cough: postnasal drip/tickle present. Lungs clear today, breathing normal.  Recommended trial of zyrtec qhs in addition to continuing delsym otc prn.  An After Visit Summary was printed and given to the patient.  FOLLOW UP: Return in about 6 months (around 10/07/2015) for routine chronic illness f/u.

## 2015-04-08 NOTE — Progress Notes (Signed)
Pre visit review using our clinic review tool, if applicable. No additional management support is needed unless otherwise documented below in the visit note. 

## 2015-05-04 ENCOUNTER — Ambulatory Visit (INDEPENDENT_AMBULATORY_CARE_PROVIDER_SITE_OTHER): Payer: Medicare Other | Admitting: Ophthalmology

## 2015-05-12 ENCOUNTER — Ambulatory Visit (INDEPENDENT_AMBULATORY_CARE_PROVIDER_SITE_OTHER): Payer: Medicare Other | Admitting: Ophthalmology

## 2015-05-12 DIAGNOSIS — H353131 Nonexudative age-related macular degeneration, bilateral, early dry stage: Secondary | ICD-10-CM

## 2015-05-12 DIAGNOSIS — D3131 Benign neoplasm of right choroid: Secondary | ICD-10-CM | POA: Diagnosis not present

## 2015-05-12 DIAGNOSIS — H43813 Vitreous degeneration, bilateral: Secondary | ICD-10-CM

## 2015-05-30 ENCOUNTER — Other Ambulatory Visit: Payer: Self-pay | Admitting: *Deleted

## 2015-05-30 MED ORDER — LUBIPROSTONE 8 MCG PO CAPS: ORAL_CAPSULE | ORAL | Status: DC

## 2015-05-30 NOTE — Telephone Encounter (Signed)
RF request for amitiza LOV: 04/08/15 Next ov: None Last written: 01/27/15 #60 w/ 3RF

## 2015-08-04 ENCOUNTER — Other Ambulatory Visit: Payer: Self-pay | Admitting: *Deleted

## 2015-08-04 MED ORDER — LORAZEPAM 1 MG PO TABS: 1.0000 mg | ORAL_TABLET | Freq: Three times a day (TID) | ORAL | Status: DC

## 2015-08-04 NOTE — Telephone Encounter (Signed)
RF request for lorazepam LOV: 04/08/15 Next ov: 10/04/15 Last written: 01/28/15 #90 w/ 5RF  Please advise. Thanks.

## 2015-08-05 NOTE — Telephone Encounter (Signed)
Rx faxed

## 2015-08-15 ENCOUNTER — Other Ambulatory Visit: Payer: Self-pay | Admitting: *Deleted

## 2015-08-15 MED ORDER — ESOMEPRAZOLE MAGNESIUM 40 MG PO CPDR: DELAYED_RELEASE_CAPSULE | ORAL | Status: DC

## 2015-08-15 NOTE — Telephone Encounter (Signed)
RF request for esomeprazole LOV: 04/08/15 Next ov: 10/04/15 Last written: 02/16/15 #90 w/ 1RF

## 2015-08-22 DIAGNOSIS — K210 Gastro-esophageal reflux disease with esophagitis: Secondary | ICD-10-CM | POA: Diagnosis not present

## 2015-08-22 DIAGNOSIS — K580 Irritable bowel syndrome with diarrhea: Secondary | ICD-10-CM | POA: Diagnosis not present

## 2015-08-22 DIAGNOSIS — I10 Essential (primary) hypertension: Secondary | ICD-10-CM | POA: Diagnosis not present

## 2015-08-22 DIAGNOSIS — R7301 Impaired fasting glucose: Secondary | ICD-10-CM | POA: Diagnosis not present

## 2015-08-22 DIAGNOSIS — E039 Hypothyroidism, unspecified: Secondary | ICD-10-CM | POA: Diagnosis not present

## 2015-08-22 DIAGNOSIS — E782 Mixed hyperlipidemia: Secondary | ICD-10-CM | POA: Diagnosis not present

## 2015-08-22 DIAGNOSIS — E785 Hyperlipidemia, unspecified: Secondary | ICD-10-CM | POA: Diagnosis not present

## 2015-08-22 DIAGNOSIS — R69 Illness, unspecified: Secondary | ICD-10-CM | POA: Diagnosis not present

## 2015-08-22 DIAGNOSIS — R030 Elevated blood-pressure reading, without diagnosis of hypertension: Secondary | ICD-10-CM | POA: Diagnosis not present

## 2015-10-04 ENCOUNTER — Ambulatory Visit: Payer: Medicare Other | Admitting: Family Medicine

## 2015-10-20 DIAGNOSIS — I708 Atherosclerosis of other arteries: Secondary | ICD-10-CM | POA: Diagnosis not present

## 2015-10-20 DIAGNOSIS — H43813 Vitreous degeneration, bilateral: Secondary | ICD-10-CM | POA: Diagnosis not present

## 2015-10-20 DIAGNOSIS — H35031 Hypertensive retinopathy, right eye: Secondary | ICD-10-CM | POA: Diagnosis not present

## 2015-10-20 DIAGNOSIS — Z961 Presence of intraocular lens: Secondary | ICD-10-CM | POA: Diagnosis not present

## 2015-10-20 DIAGNOSIS — H35032 Hypertensive retinopathy, left eye: Secondary | ICD-10-CM | POA: Diagnosis not present

## 2016-01-12 DIAGNOSIS — R05 Cough: Secondary | ICD-10-CM | POA: Diagnosis not present

## 2016-01-12 DIAGNOSIS — J40 Bronchitis, not specified as acute or chronic: Secondary | ICD-10-CM | POA: Diagnosis not present

## 2016-01-25 DIAGNOSIS — J018 Other acute sinusitis: Secondary | ICD-10-CM | POA: Diagnosis not present

## 2016-03-09 DIAGNOSIS — Z1231 Encounter for screening mammogram for malignant neoplasm of breast: Secondary | ICD-10-CM | POA: Diagnosis not present

## 2016-03-09 DIAGNOSIS — Z23 Encounter for immunization: Secondary | ICD-10-CM | POA: Diagnosis not present

## 2016-03-14 DIAGNOSIS — E782 Mixed hyperlipidemia: Secondary | ICD-10-CM | POA: Diagnosis not present

## 2016-03-14 DIAGNOSIS — E039 Hypothyroidism, unspecified: Secondary | ICD-10-CM | POA: Diagnosis not present

## 2016-03-14 DIAGNOSIS — I10 Essential (primary) hypertension: Secondary | ICD-10-CM | POA: Diagnosis not present

## 2016-03-16 DIAGNOSIS — K580 Irritable bowel syndrome with diarrhea: Secondary | ICD-10-CM | POA: Diagnosis not present

## 2016-03-16 DIAGNOSIS — E782 Mixed hyperlipidemia: Secondary | ICD-10-CM | POA: Diagnosis not present

## 2016-03-16 DIAGNOSIS — R69 Illness, unspecified: Secondary | ICD-10-CM | POA: Diagnosis not present

## 2016-03-16 DIAGNOSIS — N3281 Overactive bladder: Secondary | ICD-10-CM | POA: Diagnosis not present

## 2016-03-16 DIAGNOSIS — E039 Hypothyroidism, unspecified: Secondary | ICD-10-CM | POA: Diagnosis not present

## 2016-05-03 DIAGNOSIS — R197 Diarrhea, unspecified: Secondary | ICD-10-CM | POA: Diagnosis not present

## 2016-05-11 ENCOUNTER — Other Ambulatory Visit: Payer: Self-pay

## 2016-05-11 MED ORDER — SOLIFENACIN SUCCINATE 5 MG PO TABS: ORAL_TABLET | ORAL | 0 refills | Status: DC

## 2016-05-11 NOTE — Telephone Encounter (Signed)
Refill sent, appt scheduled for 12/17

## 2016-05-28 ENCOUNTER — Ambulatory Visit (INDEPENDENT_AMBULATORY_CARE_PROVIDER_SITE_OTHER): Payer: Medicare HMO | Admitting: Ophthalmology

## 2016-05-28 DIAGNOSIS — D3131 Benign neoplasm of right choroid: Secondary | ICD-10-CM | POA: Diagnosis not present

## 2016-05-28 DIAGNOSIS — H43813 Vitreous degeneration, bilateral: Secondary | ICD-10-CM

## 2016-05-28 DIAGNOSIS — H353131 Nonexudative age-related macular degeneration, bilateral, early dry stage: Secondary | ICD-10-CM | POA: Diagnosis not present

## 2016-08-07 DIAGNOSIS — J060 Acute laryngopharyngitis: Secondary | ICD-10-CM | POA: Diagnosis not present

## 2016-08-07 DIAGNOSIS — Z6824 Body mass index (BMI) 24.0-24.9, adult: Secondary | ICD-10-CM | POA: Diagnosis not present

## 2016-08-07 DIAGNOSIS — R05 Cough: Secondary | ICD-10-CM | POA: Diagnosis not present

## 2016-08-13 ENCOUNTER — Other Ambulatory Visit: Payer: Self-pay | Admitting: *Deleted

## 2016-08-13 NOTE — Telephone Encounter (Deleted)
Fax from [place].  RF request for esomeprazole LOV: 04/08/15 Next ov: None Last written: 08/15/15 #90 w/ 3RF  Please advise. Thanks.

## 2016-08-13 NOTE — Telephone Encounter (Signed)
Opened in error. Pt has moved, no longer under Dr. Isla Pence care.

## 2016-11-28 DIAGNOSIS — D3131 Benign neoplasm of right choroid: Secondary | ICD-10-CM | POA: Diagnosis not present

## 2016-11-28 DIAGNOSIS — H43813 Vitreous degeneration, bilateral: Secondary | ICD-10-CM | POA: Diagnosis not present

## 2016-11-28 DIAGNOSIS — H35363 Drusen (degenerative) of macula, bilateral: Secondary | ICD-10-CM | POA: Diagnosis not present

## 2016-11-28 DIAGNOSIS — H35033 Hypertensive retinopathy, bilateral: Secondary | ICD-10-CM | POA: Diagnosis not present

## 2017-03-25 DIAGNOSIS — Z23 Encounter for immunization: Secondary | ICD-10-CM | POA: Diagnosis not present

## 2017-04-15 DIAGNOSIS — Z1231 Encounter for screening mammogram for malignant neoplasm of breast: Secondary | ICD-10-CM | POA: Diagnosis not present

## 2017-06-03 ENCOUNTER — Ambulatory Visit (INDEPENDENT_AMBULATORY_CARE_PROVIDER_SITE_OTHER): Payer: Medicare HMO | Admitting: Ophthalmology

## 2017-07-12 ENCOUNTER — Encounter (INDEPENDENT_AMBULATORY_CARE_PROVIDER_SITE_OTHER): Payer: Medicare HMO | Admitting: Ophthalmology

## 2017-07-12 DIAGNOSIS — H43813 Vitreous degeneration, bilateral: Secondary | ICD-10-CM

## 2017-07-12 DIAGNOSIS — D3131 Benign neoplasm of right choroid: Secondary | ICD-10-CM | POA: Diagnosis not present

## 2017-09-06 DIAGNOSIS — E039 Hypothyroidism, unspecified: Secondary | ICD-10-CM | POA: Diagnosis not present

## 2017-09-06 DIAGNOSIS — E782 Mixed hyperlipidemia: Secondary | ICD-10-CM | POA: Diagnosis not present

## 2017-09-11 DIAGNOSIS — N3281 Overactive bladder: Secondary | ICD-10-CM | POA: Diagnosis not present

## 2017-09-11 DIAGNOSIS — R69 Illness, unspecified: Secondary | ICD-10-CM | POA: Diagnosis not present

## 2017-09-11 DIAGNOSIS — E039 Hypothyroidism, unspecified: Secondary | ICD-10-CM | POA: Diagnosis not present

## 2017-09-11 DIAGNOSIS — Z0000 Encounter for general adult medical examination without abnormal findings: Secondary | ICD-10-CM | POA: Diagnosis not present

## 2017-09-11 DIAGNOSIS — E782 Mixed hyperlipidemia: Secondary | ICD-10-CM | POA: Diagnosis not present

## 2017-09-11 DIAGNOSIS — K580 Irritable bowel syndrome with diarrhea: Secondary | ICD-10-CM | POA: Diagnosis not present

## 2017-10-11 DIAGNOSIS — R69 Illness, unspecified: Secondary | ICD-10-CM | POA: Diagnosis not present

## 2017-10-11 DIAGNOSIS — E782 Mixed hyperlipidemia: Secondary | ICD-10-CM | POA: Diagnosis not present

## 2017-10-11 DIAGNOSIS — N3281 Overactive bladder: Secondary | ICD-10-CM | POA: Diagnosis not present

## 2017-10-11 DIAGNOSIS — Z0000 Encounter for general adult medical examination without abnormal findings: Secondary | ICD-10-CM | POA: Diagnosis not present

## 2017-10-11 DIAGNOSIS — E039 Hypothyroidism, unspecified: Secondary | ICD-10-CM | POA: Diagnosis not present

## 2017-10-11 DIAGNOSIS — K580 Irritable bowel syndrome with diarrhea: Secondary | ICD-10-CM | POA: Diagnosis not present

## 2017-10-11 DIAGNOSIS — M25551 Pain in right hip: Secondary | ICD-10-CM | POA: Diagnosis not present

## 2017-10-11 DIAGNOSIS — S30860A Insect bite (nonvenomous) of lower back and pelvis, initial encounter: Secondary | ICD-10-CM | POA: Diagnosis not present

## 2017-10-28 DIAGNOSIS — M25551 Pain in right hip: Secondary | ICD-10-CM | POA: Diagnosis not present

## 2017-12-27 DIAGNOSIS — I708 Atherosclerosis of other arteries: Secondary | ICD-10-CM | POA: Diagnosis not present

## 2017-12-27 DIAGNOSIS — H35033 Hypertensive retinopathy, bilateral: Secondary | ICD-10-CM | POA: Diagnosis not present

## 2017-12-27 DIAGNOSIS — H35363 Drusen (degenerative) of macula, bilateral: Secondary | ICD-10-CM | POA: Diagnosis not present

## 2017-12-27 DIAGNOSIS — D3131 Benign neoplasm of right choroid: Secondary | ICD-10-CM | POA: Diagnosis not present

## 2018-01-02 ENCOUNTER — Encounter (HOSPITAL_COMMUNITY): Payer: Self-pay

## 2018-01-02 ENCOUNTER — Emergency Department (HOSPITAL_COMMUNITY)
Admission: EM | Admit: 2018-01-02 | Discharge: 2018-01-02 | Disposition: A | Discharge status: Home / Self Care | Payer: Medicare HMO | Attending: Emergency Medicine | Admitting: Emergency Medicine

## 2018-01-02 ENCOUNTER — Other Ambulatory Visit: Payer: Self-pay

## 2018-01-02 ENCOUNTER — Emergency Department (HOSPITAL_COMMUNITY): Payer: Medicare HMO

## 2018-01-02 DIAGNOSIS — Z87891 Personal history of nicotine dependence: Secondary | ICD-10-CM | POA: Diagnosis not present

## 2018-01-02 DIAGNOSIS — N182 Chronic kidney disease, stage 2 (mild): Secondary | ICD-10-CM | POA: Insufficient documentation

## 2018-01-02 DIAGNOSIS — E039 Hypothyroidism, unspecified: Secondary | ICD-10-CM | POA: Insufficient documentation

## 2018-01-02 DIAGNOSIS — Z7982 Long term (current) use of aspirin: Secondary | ICD-10-CM | POA: Insufficient documentation

## 2018-01-02 DIAGNOSIS — R55 Syncope and collapse: Secondary | ICD-10-CM | POA: Insufficient documentation

## 2018-01-02 DIAGNOSIS — R079 Chest pain, unspecified: Secondary | ICD-10-CM | POA: Diagnosis not present

## 2018-01-02 DIAGNOSIS — Z79899 Other long term (current) drug therapy: Secondary | ICD-10-CM | POA: Diagnosis not present

## 2018-01-02 LAB — BASIC METABOLIC PANEL
Anion gap: 5 (ref 5–15)
BUN: 17 mg/dL (ref 8–23)
CO2: 27 mmol/L (ref 22–32)
Calcium: 9 mg/dL (ref 8.9–10.3)
Chloride: 104 mmol/L (ref 98–111)
Creatinine, Ser: 0.97 mg/dL (ref 0.44–1.00)
GFR calc Af Amer: >60 mL/min (ref >60)
GFR calc non Af Amer: 53 mL/min — ABNORMAL LOW (ref >60)
Glucose, Bld: 112 mg/dL — ABNORMAL HIGH (ref 70–99)
Potassium: 4.5 mmol/L (ref 3.5–5.1)
Sodium: 136 mmol/L (ref 135–145)

## 2018-01-02 LAB — I-STAT TROPONIN, ED: Troponin i, poc: 0 ng/mL (ref 0.00–0.08)

## 2018-01-02 LAB — CBC
HCT: 40.7 % (ref 36.0–46.0)
Hemoglobin: 13.5 g/dL (ref 12.0–15.0)
MCH: 30.1 pg (ref 26.0–34.0)
MCHC: 33.2 g/dL (ref 30.0–36.0)
MCV: 90.6 fL (ref 78.0–100.0)
Platelets: 228 10*3/uL (ref 150–400)
RBC: 4.49 MIL/uL (ref 3.87–5.11)
RDW: 13.3 % (ref 11.5–15.5)
WBC: 5.3 10*3/uL (ref 4.0–10.5)

## 2018-01-02 LAB — TROPONIN I: Troponin I: <0.03 ng/mL (ref <0.03)

## 2018-01-02 NOTE — ED Triage Notes (Signed)
Pt reports after getting dressed this morning she felt dizzy, saw "floaters", was diaphoretic, and had pain in left side of neck.  Pt presently denies dizziness but says has slight left sided neck pain and epigastric pain.

## 2018-01-02 NOTE — ED Notes (Signed)
ED Provider at bedside. 

## 2018-01-02 NOTE — ED Provider Notes (Signed)
[facility] EMERGENCY DEPARTMENT Provider Note   CSN: [id number] Arrival date & time: 01/02/18  0809     History   Chief Complaint Chief Complaint  Patient presents with  . Chest Pain    HPI Julie Park is a 84 y.o. female.  HPI   84 year old female presenting after near syncopal event earlier today.  Patient states that she was getting dressed when she began to feel dizzy as she may pass out.  She saw "floaters" in bilateral eyes that were flashing.  She was diaphoretic.  She had some mild discomfort in the left side of her neck.  She did not actually syncopized.  She denies any chest pain.  No dyspnea.  Symptoms lasted several minutes and resolved.  She has felt better since then with no recurrence.  Past Medical History:  Diagnosis Date  . Anxiety   . Chest pain 02/08/15   ETT normal  . Chronic renal insufficiency, stage II (mild)    CrCl 60s  . Diverticulosis    Diverticulitis (1982)  . Fracture of rib of right side 08/16/2013  . GERD (gastroesophageal reflux disease)   . Hyperlipidemia, mixed   . Hypothyroidism   . IBS (irritable bowel syndrome)   . Right lumbar radiculopathy    Intermittent x 2 episodes in summer 2014  . Shingles 1967 and 2010  . Ulcer   . Urine incontinence   . Vertigo     Patient Active Problem List   Diagnosis Date Noted  . Cervical cancer screening 03/31/2014  . Hyponatremia 03/31/2014  . Tick bite 11/19/2013  . Cough 11/19/2013  . Colon cancer screening 09/28/2013  . Breast cancer screening 09/28/2013  . Preventative health care 09/28/2013  . Hyperlipidemia   . Diverticular disease   . Anxiety state   . GERD (gastroesophageal reflux disease)   . Hypothyroidism   . Urine incontinence   . IRRITABLE BOWEL SYNDROME 03/02/2010    Past Surgical History:  Procedure Laterality Date  . CARDIOVASCULAR STRESS TEST  02/08/15   ETT normal  . CATARACT EXTRACTION  2012   Dr. Herbert Deaner  . COLONOSCOPY  2005   Dr. Arsenio Loader at [facility] (normal  per pt report)  . DEXA  11/03/13   Heel T score -0.8.  Marland Kitchen [place]   right foot  . TONSILLECTOMY AND ADENOIDECTOMY  1948  . TUBAL LIGATION  1974  . WISDOM TOOTH EXTRACTION       OB History   None      Home Medications    Prior to Admission medications   Medication Sig Start Date End Date Taking? Authorizing Provider  acetaminophen (TYLENOL) 500 MG tablet Take 500-1,000 mg by mouth every 6 (six) hours as needed for headache.    Yes [provider]  aspirin 81 MG tablet Take 81 mg by mouth every morning.    Yes [provider]  atorvastatin (LIPITOR) 20 MG tablet TAKE 1 PILL DAILY OR AS DIRECTED Patient taking differently: Take 1/2 tablet every night 02/16/14  Yes McGowen, Adrian Blackwater, MD  ketotifen (ZADITOR) 0.025 % ophthalmic solution Place 1 drop into both eyes daily as needed (for irritation.).   Yes [provider]  levothyroxine (SYNTHROID, LEVOTHROID) 88 MCG tablet Take 1 tablet (88 mcg total) by mouth daily. 09/14/14  Yes McGowen, Adrian Blackwater, MD  LORazepam (ATIVAN) 1 MG tablet Take 1 tablet (1 mg total) by mouth every 8 (eight) hours. Patient taking differently: Take 1 mg by mouth  every 8 (eight) hours as needed for anxiety.  08/04/15  Yes McGowen, Adrian Blackwater, MD  lubiprostone (AMITIZA) 8 MCG capsule TAKE 1 CAPSULE (8 MCG TOTAL) BY MOUTH 2 (TWO) TIMES DAILY WITH A MEAL. 05/30/15  Yes McGowen, Adrian Blackwater, MD  meloxicam (MOBIC) 15 MG tablet Take 15 mg by mouth daily. 12/29/17  Yes [provider]  omeprazole (PRILOSEC) 40 MG capsule Take 40 mg by mouth daily. 11/16/17  Yes [provider]  polyvinyl alcohol (LIQUIFILM TEARS) 1.4 % ophthalmic solution Place 1 drop into both eyes as needed for dry eyes.   Yes [provider]  solifenacin (VESICARE) 5 MG tablet TAKE 1 TABLET (5 MG TOTAL) BY MOUTH DAILY. 05/11/16  Yes McGowen, Adrian Blackwater, MD  polyethylene glycol (MIRALAX / GLYCOLAX) packet Take 17 g by mouth every other day. 12/01/17    [provider]    Family History Family History  Problem Relation Age of Onset  . Cancer Father        pancreatic  . Asthma Daughter   . Heart attack Paternal Grandfather     Social History Social History   Tobacco Use  . Smoking status: Former Smoker    Packs/day: 1.00    Years: 8.00    Pack years: 8.00    Types: Cigarettes    Start date: 06/26/1983  . Smokeless tobacco: Never Used  Substance Use Topics  . Alcohol use: No  . Drug use: No     Allergies   Patient has no known allergies.   Review of Systems Review of Systems  All systems reviewed and negative, other than as noted in HPI.  Physical Exam Updated Vital Signs BP 126/85   Pulse (!) 57   Temp 97.6 F (36.4 C) (Oral)   Resp 19   Ht 5\' 5"  (1.651 m)   Wt 72.6 kg (160 lb)   SpO2 96%   BMI 26.63 kg/m   Physical Exam  Constitutional: She appears well-developed and well-nourished. No distress.  HENT:  Head: Normocephalic and atraumatic.  Eyes: Conjunctivae are normal. Right eye exhibits no discharge. Left eye exhibits no discharge.  Neck: Neck supple.  Cardiovascular: Normal rate, regular rhythm and normal heart sounds. Exam reveals no gallop and no friction rub.  No murmur heard. Pulmonary/Chest: Effort normal and breath sounds normal. No respiratory distress.  Abdominal: Soft. She exhibits no distension. There is no tenderness.  Musculoskeletal: She exhibits no edema or tenderness.  Neurological: She is alert.  Skin: Skin is warm and dry.  Psychiatric: She has a normal mood and affect. Her behavior is normal. Thought content normal.  Nursing note and vitals reviewed.    ED Treatments / Results  Labs (all labs ordered are listed, but only abnormal results are displayed) Labs Reviewed  BASIC METABOLIC PANEL - Abnormal; Notable for the following components:      Result Value   Glucose, Bld 112 (*)    GFR calc non Af Amer 53 (*)    All other components within normal limits  CBC    TROPONIN I  I-STAT TROPONIN, ED    EKG EKG Interpretation  Date/Time:  Thursday January 02 2018 08:19:12 EDT Ventricular Rate:  61 PR Interval:    QRS Duration: 84 QT Interval:  421 QTC Calculation: 424 R Axis:   11 Text Interpretation:  Sinus rhythm Abnormal R-wave progression, early transition Confirmed by Virgel Manifold [phone]) on 01/02/2018 10:51:27 AM   Radiology Dg Chest 2 View  Result Date: 01/02/2018  CLINICAL DATA:  Chest pain, dizziness and diaphoresis today. EXAM: CHEST - 2 VIEW COMPARISON:  PA and lateral chest 01/12/2016. FINDINGS: The lungs are clear. Heart size is normal. Aortic atherosclerosis is noted. No pneumothorax or pleural effusion. Remote right rib fractures are unchanged. IMPRESSION: No acute disease. Atherosclerosis. Electronically Signed   By: Inge Rise M.D.   On: 01/02/2018 09:20    Procedures Procedures (including critical care time)  Medications Ordered in ED Medications - No data to display   Initial Impression / Assessment and Plan / ED Course  I have reviewed the triage vital signs and the nursing notes.  Pertinent labs & imaging results that were available during my care of the patient were reviewed by me and considered in my medical decision making (see chart for details).     84 year old female with what sounds like a near syncopal event.  Now resolved.  No recurrent symptoms.  She is afebrile.  He dynamically stable.  EKG with no overt ischemic changes or other concerning findings.  Her neck discomfort during this episode is somewhat concerning although I do not think that this is ACS.  Consider vertebral or carotid artery dissection or other emergent process, but I doubt.  Final Clinical Impressions(s) / ED Diagnoses   Near syncope  ED Discharge Orders    None       Virgel Manifold, MD 01/06/18 [phone]

## 2018-01-29 DIAGNOSIS — R69 Illness, unspecified: Secondary | ICD-10-CM | POA: Diagnosis not present

## 2018-01-29 DIAGNOSIS — R252 Cramp and spasm: Secondary | ICD-10-CM | POA: Diagnosis not present

## 2018-01-29 DIAGNOSIS — M25551 Pain in right hip: Secondary | ICD-10-CM | POA: Diagnosis not present

## 2018-01-29 DIAGNOSIS — Z6826 Body mass index (BMI) 26.0-26.9, adult: Secondary | ICD-10-CM | POA: Diagnosis not present

## 2018-02-27 DIAGNOSIS — E782 Mixed hyperlipidemia: Secondary | ICD-10-CM | POA: Diagnosis not present

## 2018-02-27 DIAGNOSIS — R69 Illness, unspecified: Secondary | ICD-10-CM | POA: Diagnosis not present

## 2018-02-27 DIAGNOSIS — E039 Hypothyroidism, unspecified: Secondary | ICD-10-CM | POA: Diagnosis not present

## 2018-03-11 DIAGNOSIS — K580 Irritable bowel syndrome with diarrhea: Secondary | ICD-10-CM | POA: Diagnosis not present

## 2018-03-11 DIAGNOSIS — Z0000 Encounter for general adult medical examination without abnormal findings: Secondary | ICD-10-CM | POA: Diagnosis not present

## 2018-03-11 DIAGNOSIS — E782 Mixed hyperlipidemia: Secondary | ICD-10-CM | POA: Diagnosis not present

## 2018-03-11 DIAGNOSIS — Z6826 Body mass index (BMI) 26.0-26.9, adult: Secondary | ICD-10-CM | POA: Diagnosis not present

## 2018-03-11 DIAGNOSIS — M25551 Pain in right hip: Secondary | ICD-10-CM | POA: Diagnosis not present

## 2018-03-11 DIAGNOSIS — S30860A Insect bite (nonvenomous) of lower back and pelvis, initial encounter: Secondary | ICD-10-CM | POA: Diagnosis not present

## 2018-03-11 DIAGNOSIS — N3281 Overactive bladder: Secondary | ICD-10-CM | POA: Diagnosis not present

## 2018-03-11 DIAGNOSIS — E039 Hypothyroidism, unspecified: Secondary | ICD-10-CM | POA: Diagnosis not present

## 2018-03-11 DIAGNOSIS — R69 Illness, unspecified: Secondary | ICD-10-CM | POA: Diagnosis not present

## 2018-03-11 DIAGNOSIS — R252 Cramp and spasm: Secondary | ICD-10-CM | POA: Diagnosis not present

## 2018-03-14 DIAGNOSIS — S0993XA Unspecified injury of face, initial encounter: Secondary | ICD-10-CM | POA: Diagnosis not present

## 2018-03-14 DIAGNOSIS — S0990XA Unspecified injury of head, initial encounter: Secondary | ICD-10-CM | POA: Diagnosis not present

## 2018-03-14 DIAGNOSIS — M25571 Pain in right ankle and joints of right foot: Secondary | ICD-10-CM | POA: Diagnosis not present

## 2018-03-14 DIAGNOSIS — M25561 Pain in right knee: Secondary | ICD-10-CM | POA: Diagnosis not present

## 2018-03-14 DIAGNOSIS — W109XXA Fall (on) (from) unspecified stairs and steps, initial encounter: Secondary | ICD-10-CM | POA: Diagnosis not present

## 2018-03-14 DIAGNOSIS — Z743 Need for continuous supervision: Secondary | ICD-10-CM | POA: Diagnosis not present

## 2018-03-14 DIAGNOSIS — W010XXA Fall on same level from slipping, tripping and stumbling without subsequent striking against object, initial encounter: Secondary | ICD-10-CM | POA: Diagnosis not present

## 2018-03-19 DIAGNOSIS — N3281 Overactive bladder: Secondary | ICD-10-CM | POA: Diagnosis not present

## 2018-03-19 DIAGNOSIS — E782 Mixed hyperlipidemia: Secondary | ICD-10-CM | POA: Diagnosis not present

## 2018-03-19 DIAGNOSIS — Z6826 Body mass index (BMI) 26.0-26.9, adult: Secondary | ICD-10-CM | POA: Diagnosis not present

## 2018-03-19 DIAGNOSIS — E039 Hypothyroidism, unspecified: Secondary | ICD-10-CM | POA: Diagnosis not present

## 2018-03-19 DIAGNOSIS — R69 Illness, unspecified: Secondary | ICD-10-CM | POA: Diagnosis not present

## 2018-03-19 DIAGNOSIS — K580 Irritable bowel syndrome with diarrhea: Secondary | ICD-10-CM | POA: Diagnosis not present

## 2018-04-17 DIAGNOSIS — Z1231 Encounter for screening mammogram for malignant neoplasm of breast: Secondary | ICD-10-CM | POA: Diagnosis not present

## 2018-04-29 DIAGNOSIS — R69 Illness, unspecified: Secondary | ICD-10-CM | POA: Diagnosis not present

## 2018-07-10 ENCOUNTER — Other Ambulatory Visit: Payer: Self-pay

## 2018-07-10 DIAGNOSIS — R3981 Functional urinary incontinence: Secondary | ICD-10-CM

## 2018-07-11 ENCOUNTER — Encounter: Payer: Self-pay | Admitting: Family Medicine

## 2018-07-11 DIAGNOSIS — F411 Generalized anxiety disorder: Secondary | ICD-10-CM | POA: Insufficient documentation

## 2018-07-11 DIAGNOSIS — N3281 Overactive bladder: Secondary | ICD-10-CM | POA: Insufficient documentation

## 2018-07-15 ENCOUNTER — Encounter: Payer: Self-pay | Admitting: Family Medicine

## 2018-07-15 ENCOUNTER — Ambulatory Visit: Payer: Medicare HMO | Admitting: Family Medicine

## 2018-07-15 VITALS — BP 138/84 | HR 89 | Temp 98.8°F | Ht 65.0 in | Wt 161.0 lb

## 2018-07-15 DIAGNOSIS — N3281 Overactive bladder: Secondary | ICD-10-CM

## 2018-07-15 DIAGNOSIS — Z7689 Persons encountering health services in other specified circumstances: Secondary | ICD-10-CM | POA: Diagnosis not present

## 2018-07-15 NOTE — Patient Instructions (Signed)

## 2018-07-15 NOTE — Progress Notes (Signed)
Subjective:    Patient ID: Julie Park, female    DOB: 1934/01/01, 85 y.o.   MRN: [id number]  Chief Complaint:  Establish Care   HPI: Julie Park is a 85 y.o. female presenting on 07/15/2018 for [place] presents today to establish care. Pt states she is doing fairly well overall. States she just had labs and a physical in October. States she has filled out the paperwork for records release. Pt states she just wants to switch back to this office. States her only real concern at this time is overactive bladder. States she is taking Toviaz and has noticed some improvement, but is still having nighttime issues. States she gets up at least three times per night. She denied dysuria with the frequency.   Relevant past medical, surgical, family, and social history reviewed and updated as indicated.  Allergies and medications reviewed and updated.   Past Medical History:  Diagnosis Date  . Anxiety   . Chest pain 02/08/15   ETT normal  . Chronic renal insufficiency, stage II (mild)    CrCl 60s  . Diverticulosis    Diverticulitis (1982)  . Fracture of rib of right side 08/16/2013  . GERD (gastroesophageal reflux disease)   . Hyperlipidemia, mixed   . Hypothyroidism   . IBS (irritable bowel syndrome)   . Morton's neuroma   . Right lumbar radiculopathy    Intermittent x 2 episodes in summer 2014  . Shingles 1967 and 2010  . Ulcer   . Urine incontinence   . Vertigo     Past Surgical History:  Procedure Laterality Date  . CARDIOVASCULAR STRESS TEST  02/08/15   ETT normal  . CATARACT EXTRACTION  2012   Dr. Herbert Deaner  . COLONOSCOPY  2005   Dr. Arsenio Loader at [facility] (normal per pt report)  . DEXA  11/03/13   Heel T score -0.8.  Marland Kitchen [place]   right foot  . TONSILLECTOMY    . TONSILLECTOMY AND ADENOIDECTOMY  1948  . TUBAL LIGATION  1974  . WISDOM TOOTH EXTRACTION      Social History   Socioeconomic History  . Marital status: Married    Spouse name:  Not on file  . Number of children: Not on file  . Years of education: Not on file  . Highest education level: Not on file  Occupational History  . Occupation: Retired  Scientific laboratory technician  . Financial resource strain: Not on file  . Food insecurity:    Worry: Not on file    Inability: Not on file  . Transportation needs:    Medical: Not on file    Non-medical: Not on file  Tobacco Use  . Smoking status: Former Smoker    Packs/day: 1.00    Years: 8.00    Pack years: 8.00    Types: Cigarettes    Start date: 06/26/1983  . Smokeless tobacco: Never Used  Substance and Sexual Activity  . Alcohol use: No  . Drug use: No  . Sexual activity: Not Currently  Lifestyle  . Physical activity:    Days per week: 0 days    Minutes per session: Not on file  . Stress: Not on file  Relationships  . Social connections:    Talks on phone: Not on file    Gets together: Not on file    Attends religious service: Not on file    Active member of club or organization: Not on  file    Attends meetings of clubs or organizations: Not on file    Relationship status: Not on file  . Intimate partner violence:    Fear of current or ex partner: Not on file    Emotionally abused: Not on file    Physically abused: Not on file    Forced sexual activity: Not on file  Other Topics Concern  . Not on file  Social History Narrative   Divorced, 2 children.   Lives in [place].   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   Tobacco: small amount, distant past.  Alcohol: none.      Outpatient Encounter Medications as of 07/15/2018  Medication Sig  . acetaminophen (TYLENOL) 500 MG tablet Take 500-1,000 mg by mouth every 6 (six) hours as needed for headache.   Marland Kitchen aspirin 81 MG tablet Take 81 mg by mouth every morning.   Marland Kitchen atorvastatin (LIPITOR) 20 MG tablet TAKE 1 PILL DAILY OR AS DIRECTED (Patient taking differently: Take 1/2 tablet every night)    . ketotifen (ZADITOR) 0.025 % ophthalmic solution Place 1 drop into both eyes daily as needed (for irritation.).  Marland Kitchen levothyroxine (SYNTHROID, LEVOTHROID) 88 MCG tablet Take 1 tablet (88 mcg total) by mouth daily.  Marland Kitchen LORazepam (ATIVAN) 1 MG tablet Take 1 tablet (1 mg total) by mouth every 8 (eight) hours. (Patient taking differently: Take 1 mg by mouth every 8 (eight) hours as needed for anxiety. )  . lubiprostone (AMITIZA) 8 MCG capsule TAKE 1 CAPSULE (8 MCG TOTAL) BY MOUTH 2 (TWO) TIMES DAILY WITH A MEAL.  Marland Kitchen polyethylene glycol (MIRALAX / GLYCOLAX) packet Take 17 g by mouth every other day.  . polyvinyl alcohol (LIQUIFILM TEARS) 1.4 % ophthalmic solution Place 1 drop into both eyes as needed for dry eyes.  Marland Kitchen solifenacin (VESICARE) 5 MG tablet TAKE 1 TABLET (5 MG TOTAL) BY MOUTH DAILY.  Marland Kitchen Esomeprazole Magnesium (NEXIUM 24HR PO) Take by mouth.  . meloxicam (MOBIC) 15 MG tablet Take by mouth.  Marland Kitchen omeprazole (PRILOSEC) 40 MG capsule Take by mouth.  . TOVIAZ 4 MG TB24 tablet    No facility-administered encounter medications on file as of 07/15/2018.     No Known Allergies  Review of Systems  Respiratory: Negative for shortness of breath.   Cardiovascular: Negative for chest pain, palpitations and leg swelling.  Genitourinary: Positive for enuresis, frequency and urgency. Negative for decreased urine volume, difficulty urinating, dysuria, flank pain, hematuria, vaginal bleeding, vaginal discharge and vaginal pain.  Neurological: Negative for dizziness, weakness and headaches.  Psychiatric/Behavioral: Negative for confusion.  All other systems reviewed and are negative.       Objective:    BP 138/84 (BP Location: Right Arm, Cuff Size: Normal)   Pulse 89   Temp 98.8 F (37.1 C) (Oral)   Ht 5\' 5"  (1.651 m)   Wt 161 lb (73 kg)   BMI 26.79 kg/m    Wt Readings from Last 3 Encounters:  07/15/18 161 lb (73 kg)  01/02/18 160 lb (72.6 kg)  04/08/15 171 lb (77.6 kg)    Physical  Exam Vitals signs and nursing note reviewed.  Constitutional:      Appearance: Normal appearance.  HENT:     Head: Normocephalic and atraumatic.     Nose: Nose normal.     Mouth/Throat:     Mouth: Mucous membranes are moist.     Pharynx: Oropharynx is clear.  Eyes:     Conjunctiva/sclera: Conjunctivae normal.     Pupils: Pupils are equal, round, and reactive to light.  Cardiovascular:     Rate and Rhythm: Normal rate and regular rhythm.     Heart sounds: Normal heart sounds.  Pulmonary:     Effort: Pulmonary effort is normal.     Breath sounds: Normal breath sounds.  Skin:    General: Skin is warm and dry.     Capillary Refill: Capillary refill takes less than 2 seconds.  Neurological:     General: No focal deficit present.     Mental Status: She is alert and oriented to person, place, and time.  Psychiatric:        Mood and Affect: Mood normal.        Behavior: Behavior normal.        Thought Content: Thought content normal.        Judgment: Judgment normal.     Results for orders placed or performed during the hospital encounter of 47/65/46  Basic metabolic panel  Result Value Ref Range   Sodium 136 135 - 145 mmol/L   Potassium 4.5 3.5 - 5.1 mmol/L   Chloride 104 98 - 111 mmol/L   CO2 27 22 - 32 mmol/L   Glucose, Bld 112 (H) 70 - 99 mg/dL   BUN 17 8 - 23 mg/dL   Creatinine, Ser 0.97 0.44 - 1.00 mg/dL   Calcium 9.0 8.9 - 10.3 mg/dL   GFR calc non Af Amer 53 (L) >60 mL/min   GFR calc Af Amer >60 >60 mL/min   Anion gap 5 5 - 15  CBC  Result Value Ref Range   WBC 5.3 4.0 - 10.5 K/uL   RBC 4.49 3.87 - 5.11 MIL/uL   Hemoglobin 13.5 12.0 - 15.0 g/dL   HCT 40.7 36.0 - 46.0 %   MCV 90.6 78.0 - 100.0 fL   MCH 30.1 26.0 - 34.0 pg   MCHC 33.2 30.0 - 36.0 g/dL   RDW 13.3 11.5 - 15.5 %   Platelets 228 150 - 400 K/uL  Troponin I  Result Value Ref Range   Troponin I <0.03 <0.03 ng/mL  I-stat troponin, ED  Result Value Ref Range   Troponin i, poc 0.00 0.00 - 0.08 ng/mL    Comment 3               Pertinent labs & imaging results that were available during my care of the patient were reviewed by me and considered in my medical decision making.  Assessment & Plan:  Shalini was seen today for establish care.  Diagnoses and all orders for this visit:  Encounter to establish care Due for labs in April for chronic medical conditions, will schedule an appointment.   Overactive bladder Nighttime bladder hygiene discussed. Will increase Toviaz to 8 mg. Pt to double up on current medication and report if this is beneficial. Will send in new prescription if beneficial.     Continue all other maintenance medications.  Follow up plan: Return in about 2 months (around 09/13/2018).  Educational handout given for Overactive bladder  The above assessment and management plan was discussed with the patient. The patient verbalized understanding of and has agreed to the management plan. Patient is aware to call the clinic if symptoms persist or worsen. Patient is aware when to return to the clinic for a follow-up visit. Patient educated on when it is appropriate to go to the emergency department.  Monia Pouch, FNP-C [facility] [phone]

## 2018-07-25 ENCOUNTER — Encounter (INDEPENDENT_AMBULATORY_CARE_PROVIDER_SITE_OTHER): Payer: Medicare HMO | Admitting: Ophthalmology

## 2018-07-25 DIAGNOSIS — H53121 Transient visual loss, right eye: Secondary | ICD-10-CM

## 2018-07-25 DIAGNOSIS — D3131 Benign neoplasm of right choroid: Secondary | ICD-10-CM | POA: Diagnosis not present

## 2018-07-25 DIAGNOSIS — H43813 Vitreous degeneration, bilateral: Secondary | ICD-10-CM

## 2018-08-26 DIAGNOSIS — H1013 Acute atopic conjunctivitis, bilateral: Secondary | ICD-10-CM | POA: Diagnosis not present

## 2018-08-26 DIAGNOSIS — H01009 Unspecified blepharitis unspecified eye, unspecified eyelid: Secondary | ICD-10-CM | POA: Diagnosis not present

## 2018-08-26 DIAGNOSIS — H04123 Dry eye syndrome of bilateral lacrimal glands: Secondary | ICD-10-CM | POA: Diagnosis not present

## 2018-09-02 DIAGNOSIS — H01009 Unspecified blepharitis unspecified eye, unspecified eyelid: Secondary | ICD-10-CM | POA: Diagnosis not present

## 2018-09-02 DIAGNOSIS — H04123 Dry eye syndrome of bilateral lacrimal glands: Secondary | ICD-10-CM | POA: Diagnosis not present

## 2018-09-02 DIAGNOSIS — H1013 Acute atopic conjunctivitis, bilateral: Secondary | ICD-10-CM | POA: Diagnosis not present

## 2018-09-10 ENCOUNTER — Other Ambulatory Visit: Payer: Self-pay | Admitting: Family Medicine

## 2018-09-10 NOTE — Telephone Encounter (Signed)
What is the name of the medication? Amitiza and lorazepam 2  Have you contacted your pharmacy to [facility] a [facility]ill[facility]  Which [facility] would you [facility] sent [facility]    Patient notified that their request is being sent to the clinical staff for review and that they should receive a call once it is complete. If they do not receive a call within 24 hours they can check with their pharmacy or our office.

## 2018-09-11 ENCOUNTER — Other Ambulatory Visit: Payer: Self-pay | Admitting: Family Medicine

## 2018-09-11 DIAGNOSIS — K581 Irritable bowel syndrome with constipation: Secondary | ICD-10-CM

## 2018-09-11 MED ORDER — LUBIPROSTONE 8 MCG PO CAPS: ORAL_CAPSULE | ORAL | 3 refills | Status: DC

## 2018-09-11 NOTE — Telephone Encounter (Signed)
I will refill the Amitiza, but not the lorazepam. She needs an appointment. That has never been prescribed at this office.

## 2018-09-11 NOTE — Telephone Encounter (Signed)
Pt aware.

## 2018-09-16 ENCOUNTER — Telehealth: Payer: Self-pay | Admitting: Family Medicine

## 2018-09-16 NOTE — Telephone Encounter (Signed)
Returned patients phone call.  Patient is requesting refill on ativan that has not been prescribed by any provider at this office. Advised patient that ativan can not be prescribed without an office visit. Patient then states that she would like to come in earlier for her appt to get a prescription of ativan. Advised patient that Monia Pouch, FNP will not prescribe this specific medication to the elderly. Advised the patient that she does not need to come out due to COVID-19.  Instructed patient that she may use melatonin, sleepy time tea, Chamomile tea, bed time routine, warm bath, no tv 1 hour before bedtime, limit caffeine 2-3 hours before bedtime or use lavendar essential oils, lotions.  Patient verbalized and appreciates recommendations.

## 2018-09-25 DIAGNOSIS — R69 Illness, unspecified: Secondary | ICD-10-CM | POA: Diagnosis not present

## 2018-09-25 DIAGNOSIS — K580 Irritable bowel syndrome with diarrhea: Secondary | ICD-10-CM | POA: Diagnosis not present

## 2018-09-25 DIAGNOSIS — E782 Mixed hyperlipidemia: Secondary | ICD-10-CM | POA: Diagnosis not present

## 2018-09-25 DIAGNOSIS — E039 Hypothyroidism, unspecified: Secondary | ICD-10-CM | POA: Diagnosis not present

## 2018-09-25 DIAGNOSIS — R252 Cramp and spasm: Secondary | ICD-10-CM | POA: Diagnosis not present

## 2018-09-30 DIAGNOSIS — K580 Irritable bowel syndrome with diarrhea: Secondary | ICD-10-CM | POA: Diagnosis not present

## 2018-09-30 DIAGNOSIS — E039 Hypothyroidism, unspecified: Secondary | ICD-10-CM | POA: Diagnosis not present

## 2018-09-30 DIAGNOSIS — N3281 Overactive bladder: Secondary | ICD-10-CM | POA: Diagnosis not present

## 2018-09-30 DIAGNOSIS — R05 Cough: Secondary | ICD-10-CM | POA: Diagnosis not present

## 2018-09-30 DIAGNOSIS — R944 Abnormal results of kidney function studies: Secondary | ICD-10-CM | POA: Diagnosis not present

## 2018-09-30 DIAGNOSIS — R69 Illness, unspecified: Secondary | ICD-10-CM | POA: Diagnosis not present

## 2018-09-30 DIAGNOSIS — E782 Mixed hyperlipidemia: Secondary | ICD-10-CM | POA: Diagnosis not present

## 2018-10-09 DIAGNOSIS — Z0000 Encounter for general adult medical examination without abnormal findings: Secondary | ICD-10-CM | POA: Diagnosis not present

## 2018-10-16 ENCOUNTER — Ambulatory Visit: Payer: Medicare HMO | Admitting: Family Medicine

## 2018-12-20 ENCOUNTER — Ambulatory Visit (HOSPITAL_COMMUNITY)
Admission: EM | Admit: 2018-12-20 | Discharge: 2018-12-20 | Disposition: A | Discharge status: Home / Self Care | Payer: Medicare HMO | Attending: Family Medicine | Admitting: Family Medicine

## 2018-12-20 ENCOUNTER — Encounter (HOSPITAL_COMMUNITY): Payer: Self-pay | Admitting: Emergency Medicine

## 2018-12-20 ENCOUNTER — Other Ambulatory Visit: Payer: Self-pay

## 2018-12-20 ENCOUNTER — Ambulatory Visit (INDEPENDENT_AMBULATORY_CARE_PROVIDER_SITE_OTHER): Payer: Medicare HMO

## 2018-12-20 ENCOUNTER — Telehealth (HOSPITAL_COMMUNITY): Payer: Self-pay | Admitting: Family Medicine

## 2018-12-20 DIAGNOSIS — W01118A Fall on same level from slipping, tripping and stumbling with subsequent striking against other sharp object, initial encounter: Secondary | ICD-10-CM

## 2018-12-20 DIAGNOSIS — W19XXXA Unspecified fall, initial encounter: Secondary | ICD-10-CM | POA: Diagnosis not present

## 2018-12-20 DIAGNOSIS — M25531 Pain in right wrist: Secondary | ICD-10-CM | POA: Diagnosis not present

## 2018-12-20 DIAGNOSIS — S41111A Laceration without foreign body of right upper arm, initial encounter: Secondary | ICD-10-CM | POA: Diagnosis not present

## 2018-12-20 DIAGNOSIS — S50811A Abrasion of right forearm, initial encounter: Secondary | ICD-10-CM

## 2018-12-20 DIAGNOSIS — Z23 Encounter for immunization: Secondary | ICD-10-CM

## 2018-12-20 MED ORDER — TETANUS-DIPHTH-ACELL PERTUSSIS 5-2.5-18.5 LF-MCG/0.5 IM SUSP
INTRAMUSCULAR | Status: AC
  Filled 2018-12-20: qty 0.5

## 2018-12-20 MED ORDER — TETANUS-DIPHTHERIA TOXOIDS TD 5-2 LFU IM INJ
0.5000 mL | INJECTION | Freq: Once | INTRAMUSCULAR | Status: AC
  Administered 2018-12-20: 0.5 mL via INTRAMUSCULAR

## 2018-12-20 NOTE — Telephone Encounter (Signed)
Call patient to advise radiology reviewed imaging. Discussed results. Questions and answered. Patient verbalized appreciation of the care received at [facility] today.

## 2018-12-20 NOTE — Discharge Instructions (Addendum)
You may keep current dressing in place to 1-2 days as long as dressing is not soiled. Do not allow dressing to remain >48 hours before changing. Dressing change include: Vaseline gauze  Dry non-adherent gauze Wrap with non-elastic wrapping material.  Your x-ray result appear normal. I have requested the radiologist to review the imaging.  If anything is abnormal, we will contact you via phone this evening.

## 2018-12-20 NOTE — ED Triage Notes (Signed)
Pt here after trip and fall today with skin tear to right wrist

## 2018-12-20 NOTE — ED Provider Notes (Signed)
[place]    CSN: [id number] Arrival date & time: 12/20/18  1359      History   Chief Complaint Chief Complaint  Patient presents with  . Fall    HPI Julie Park is a 85 y.o. female.   HPI  Patient reports experiencing a fall today while doing yard work. She landed on rocks and mulch experiencing an abrasions to her right wrist. She continued to complete yard work and family encouraged her to come into urgent care for further evaluation. She denies numbness and tingling of hand or fingers, joint or bone pain. Last  TDAP 2015. No prior injuries or fractures to right wrist or hands. Immunization History  Administered Date(s) Administered  . Influenza Split 02/24/2012  . Influenza, High Dose Seasonal PF 04/29/2018  . Influenza,inj,Quad PF,6+ Mos 02/23/2013  . Influenza-Unspecified 03/08/2015, 03/26/2017  . Pneumococcal Conjugate-13 03/25/1996, 03/20/2013  . Pneumococcal Polysaccharide-23 03/20/2013  . Tdap 09/17/2013  . Zoster 08/23/2012    Past Medical History:  Diagnosis Date  . Anxiety   . Chest pain 02/08/15   ETT normal  . Chronic renal insufficiency, stage II (mild)    CrCl 60s  . Diverticulosis    Diverticulitis (1982)  . Fracture of rib of right side 08/16/2013  . GERD (gastroesophageal reflux disease)   . Hyperlipidemia, mixed   . Hypothyroidism   . IBS (irritable bowel syndrome)   . Morton's neuroma   . Right lumbar radiculopathy    Intermittent x 2 episodes in summer 2014  . Shingles 1967 and 2010  . Ulcer   . Urine incontinence   . Vertigo     Patient Active Problem List   Diagnosis Date Noted  . GAD (generalized anxiety disorder) 07/11/2018  . Overactive bladder 07/11/2018  . Functional urinary incontinence 07/10/2018  . Cervical cancer screening 03/31/2014  . Hyponatremia 03/31/2014  . Tick bite 11/19/2013  . Cough 11/19/2013  . Colon cancer screening 09/28/2013  . Breast cancer screening 09/28/2013  . Preventative health  care 09/28/2013  . Mixed hyperlipidemia   . Diverticular disease   . Anxiety state   . GERD (gastroesophageal reflux disease)   . Hypothyroidism   . Urine incontinence   . IRRITABLE BOWEL SYNDROME 03/02/2010    Past Surgical History:  Procedure Laterality Date  . CARDIOVASCULAR STRESS TEST  02/08/15   ETT normal  . CATARACT EXTRACTION  2012   Dr. Herbert Deaner  . COLONOSCOPY  2005   [facility] Arsenio Loader at [facility] (normal per pt report)  . DEXA  11/03/13   Heel T score -0.8.  Marland Kitchen [place]   right foot  . TONSILLECTOMY    . TONSILLECTOMY AND ADENOIDECTOMY  1948  . TUBAL LIGATION  1974  . WISDOM TOOTH EXTRACTION      OB History   No obstetric history on file.      Home Medications    Prior to Admission medications   Medication Sig Start Date End Date Taking? Authorizing Provider  acetaminophen (TYLENOL) 500 MG tablet Take 500-1,000 mg by mouth every 6 (six) hours as needed for headache.     [provider]  aspirin 81 MG tablet Take 81 mg by mouth every morning.     [provider]  atorvastatin (LIPITOR) 20 MG tablet TAKE 1 PILL DAILY OR AS DIRECTED Patient taking differently: Take 1/2 tablet every night 02/16/14   McGowen, Adrian Blackwater, MD  Esomeprazole Magnesium (NEXIUM 24HR PO) Take by mouth.  [provider]  ketotifen (ZADITOR) 0.025 % ophthalmic solution Place 1 drop into both eyes daily as needed (for irritation.).    [provider]  levothyroxine (SYNTHROID, LEVOTHROID) 88 MCG tablet Take 1 tablet (88 mcg total) by mouth daily. 09/14/14   McGowen, Adrian Blackwater, MD  LORazepam (ATIVAN) 1 MG tablet Take 1 tablet (1 mg total) by mouth every 8 (eight) hours. Patient taking differently: Take 1 mg by mouth every 8 (eight) hours as needed for anxiety.  08/04/15   McGowen, Adrian Blackwater, MD  lubiprostone (AMITIZA) 8 MCG capsule TAKE 1 CAPSULE (8 MCG TOTAL) BY MOUTH 2 (TWO) TIMES DAILY WITH A MEAL. 09/11/18   Baruch Gouty, FNP  meloxicam (MOBIC) 15 MG  tablet Take by mouth.    [provider]  omeprazole (PRILOSEC) 40 MG capsule Take by mouth.    [provider]  polyethylene glycol (MIRALAX / GLYCOLAX) packet Take 17 g by mouth every other day. 12/01/17   [provider]  polyvinyl alcohol (LIQUIFILM TEARS) 1.4 % ophthalmic solution Place 1 drop into both eyes as needed for dry eyes.    [provider]  solifenacin (VESICARE) 5 MG tablet TAKE 1 TABLET (5 MG TOTAL) BY MOUTH DAILY. 05/11/16   Tammi Sou, MD  TOVIAZ 4 MG TB24 tablet  07/04/18   [provider]    Family History Family History  Problem Relation Age of Onset  . Cancer Father        pancreatic  . Asthma Daughter   . Heart attack Paternal Grandfather     Social History Social History   Tobacco Use  . Smoking status: Former Smoker    Packs/day: 1.00    Years: 8.00    Pack years: 8.00    Types: Cigarettes    Start date: 06/26/1983  . Smokeless tobacco: Never Used  Substance Use Topics  . Alcohol use: No  . Drug use: No     Allergies   Patient has no known allergies.   Review of Systems Review of Systems Pertinent negatives listed in HPI Physical Exam Triage Vital Signs ED Triage Vitals [12/20/18 1436]  Enc Vitals Group     BP 134/87     Pulse Rate 91     Resp 18     Temp 98.8 F (37.1 C)     Temp Source Oral     SpO2 95 %     Weight      Height      Head Circumference      Peak Flow      Pain Score 3     Pain Loc      Pain Edu?      Excl. in [place]?    No data found.  Updated Vital Signs BP 134/87 (BP Location: Right Arm)   Pulse 91   Temp 98.8 F (37.1 C) (Oral)   Resp 18   SpO2 95%   Visual Acuity Right Eye Distance:   Left Eye Distance:   Bilateral Distance:    Right Eye Near:   Left Eye Near:    Bilateral Near:     Physical Exam Neck:     Musculoskeletal: Normal range of motion.  Cardiovascular:     Rate and Rhythm: Normal rate.  Pulmonary:     Effort: Pulmonary effort is  normal.  Musculoskeletal:        General: Swelling present.     Right wrist: She exhibits tenderness. She exhibits no  bony tenderness.     Comments: Right Wrist: Abrasion-skin tear of posterior   Skin:    General: Skin is warm.  Neurological:     General: No focal deficit present.     Mental Status: She is alert.    UC Treatments / Results  Labs (all labs ordered are listed, but only abnormal results are displayed) Labs Reviewed - No data to display  EKG None  Radiology Dg Wrist Complete Right  Result Date: 12/20/2018 CLINICAL DATA:  85 year old female with right wrist pain after falling and hitting her arm on a paving stone EXAM: RIGHT WRIST - COMPLETE 3+ VIEW COMPARISON:  None. FINDINGS: No evidence of acute fracture or malalignment. Focal soft tissue thickening overlying the dorsal distal forearm just proximal to the wrist consistent with soft tissue contusion. Degenerative osteoarthritis of the STT and thumb carpometacarpal joint. IMPRESSION: Soft tissue contusion over the dorsal distal forearm without evidence of underlying fracture. Degenerative osteoarthritis of the STT and thumb carpometacarpal joints. Electronically Signed   By: Jacqulynn Cadet M.D.   On: 12/20/2018 17:09    Procedures Procedures (including critical care time)  Medications Ordered in UC Medications - No data to display  Initial Impression / Assessment and Plan / UC Course  I have reviewed the triage vital signs and the nursing notes.  Pertinent labs & imaging results that were available during my care of the patient were reviewed by me and considered in my medical decision making (see chart for details).    Patient presents today with a superficial skin tear following a fall at home.  Imaging was negative for any acute fracture however indicates degenerative joint disease STT and thumb carpometacarpal joints. Patient offered pain medication although she declined. She will continue tylenol 500 mg every  6 hours PRN. Dressing change instructions provided to patient.  Patient verbalized understanding and agreement with plan. Red flags addressed. Final Clinical Impressions(s) / UC Diagnoses   Final diagnoses:  Fall, initial encounter  Skin tear of right upper arm without complication, initial encounter  Need for Tdap vaccination     Discharge Instructions     You may keep current dressing in place to 1-2 days as long as dressing is not soiled. Do not allow dressing to remain >48 hours before changing. Dressing change include: Vaseline gauze  Dry non-adherent gauze Wrap with non-elastic wrapping material.  Your x-ray result appear normal. I have requested the radiologist to review the imaging.  If anything is abnormal, we will contact you via phone this evening.     ED Prescriptions    None     Controlled Substance Prescriptions [place] Controlled Substance Registry consulted? Not Applicable   Scot Jun, FNP 12/20/18 [id number]

## 2018-12-24 DIAGNOSIS — S51811A Laceration without foreign body of right forearm, initial encounter: Secondary | ICD-10-CM | POA: Diagnosis not present

## 2018-12-24 DIAGNOSIS — Z9181 History of falling: Secondary | ICD-10-CM | POA: Diagnosis not present

## 2018-12-31 ENCOUNTER — Other Ambulatory Visit (HOSPITAL_COMMUNITY): Payer: Self-pay | Admitting: Adult Health Nurse Practitioner

## 2018-12-31 ENCOUNTER — Other Ambulatory Visit: Payer: Self-pay

## 2018-12-31 ENCOUNTER — Ambulatory Visit (HOSPITAL_COMMUNITY)
Admission: RE | Admit: 2018-12-31 | Discharge: 2018-12-31 | Disposition: A | Discharge status: Home / Self Care | Payer: Medicare HMO | Source: Ambulatory Visit | Attending: Adult Health Nurse Practitioner | Admitting: Adult Health Nurse Practitioner

## 2018-12-31 DIAGNOSIS — R05 Cough: Secondary | ICD-10-CM | POA: Diagnosis not present

## 2018-12-31 DIAGNOSIS — R059 Cough, unspecified: Secondary | ICD-10-CM

## 2019-01-02 DIAGNOSIS — N259 Disorder resulting from impaired renal tubular function, unspecified: Secondary | ICD-10-CM | POA: Diagnosis not present

## 2019-01-02 DIAGNOSIS — N179 Acute kidney failure, unspecified: Secondary | ICD-10-CM | POA: Diagnosis not present

## 2019-01-02 DIAGNOSIS — Z0001 Encounter for general adult medical examination with abnormal findings: Secondary | ICD-10-CM | POA: Diagnosis not present

## 2019-01-02 DIAGNOSIS — R944 Abnormal results of kidney function studies: Secondary | ICD-10-CM | POA: Diagnosis not present

## 2019-01-02 DIAGNOSIS — Z0000 Encounter for general adult medical examination without abnormal findings: Secondary | ICD-10-CM | POA: Diagnosis not present

## 2019-01-02 DIAGNOSIS — E039 Hypothyroidism, unspecified: Secondary | ICD-10-CM | POA: Diagnosis not present

## 2019-01-02 DIAGNOSIS — S51811A Laceration without foreign body of right forearm, initial encounter: Secondary | ICD-10-CM | POA: Diagnosis not present

## 2019-01-02 DIAGNOSIS — R05 Cough: Secondary | ICD-10-CM | POA: Diagnosis not present

## 2019-01-02 DIAGNOSIS — N182 Chronic kidney disease, stage 2 (mild): Secondary | ICD-10-CM | POA: Diagnosis not present

## 2019-01-02 DIAGNOSIS — R5383 Other fatigue: Secondary | ICD-10-CM | POA: Diagnosis not present

## 2019-01-02 DIAGNOSIS — R69 Illness, unspecified: Secondary | ICD-10-CM | POA: Diagnosis not present

## 2019-01-15 DIAGNOSIS — H35033 Hypertensive retinopathy, bilateral: Secondary | ICD-10-CM | POA: Diagnosis not present

## 2019-01-15 DIAGNOSIS — D3131 Benign neoplasm of right choroid: Secondary | ICD-10-CM | POA: Diagnosis not present

## 2019-01-15 DIAGNOSIS — H35013 Changes in retinal vascular appearance, bilateral: Secondary | ICD-10-CM | POA: Diagnosis not present

## 2019-01-15 DIAGNOSIS — H524 Presbyopia: Secondary | ICD-10-CM | POA: Diagnosis not present

## 2019-01-15 DIAGNOSIS — H35363 Drusen (degenerative) of macula, bilateral: Secondary | ICD-10-CM | POA: Diagnosis not present

## 2019-01-24 ENCOUNTER — Other Ambulatory Visit: Payer: Self-pay

## 2019-01-24 ENCOUNTER — Emergency Department (HOSPITAL_COMMUNITY): Payer: Medicare HMO

## 2019-01-24 ENCOUNTER — Encounter (HOSPITAL_COMMUNITY): Payer: Self-pay | Admitting: Emergency Medicine

## 2019-01-24 ENCOUNTER — Emergency Department (HOSPITAL_COMMUNITY)
Admission: EM | Admit: 2019-01-24 | Discharge: 2019-01-24 | Disposition: A | Discharge status: Home / Self Care | Payer: Medicare HMO | Attending: Emergency Medicine | Admitting: Emergency Medicine

## 2019-01-24 DIAGNOSIS — Z7982 Long term (current) use of aspirin: Secondary | ICD-10-CM | POA: Diagnosis not present

## 2019-01-24 DIAGNOSIS — Y939 Activity, unspecified: Secondary | ICD-10-CM | POA: Diagnosis not present

## 2019-01-24 DIAGNOSIS — X500XXA Overexertion from strenuous movement or load, initial encounter: Secondary | ICD-10-CM | POA: Diagnosis not present

## 2019-01-24 DIAGNOSIS — S22000A Wedge compression fracture of unspecified thoracic vertebra, initial encounter for closed fracture: Secondary | ICD-10-CM | POA: Diagnosis not present

## 2019-01-24 DIAGNOSIS — K5730 Diverticulosis of large intestine without perforation or abscess without bleeding: Secondary | ICD-10-CM | POA: Diagnosis not present

## 2019-01-24 DIAGNOSIS — Z79899 Other long term (current) drug therapy: Secondary | ICD-10-CM | POA: Diagnosis not present

## 2019-01-24 DIAGNOSIS — Y929 Unspecified place or not applicable: Secondary | ICD-10-CM | POA: Diagnosis not present

## 2019-01-24 DIAGNOSIS — Y999 Unspecified external cause status: Secondary | ICD-10-CM | POA: Insufficient documentation

## 2019-01-24 DIAGNOSIS — R1013 Epigastric pain: Secondary | ICD-10-CM | POA: Diagnosis not present

## 2019-01-24 DIAGNOSIS — E039 Hypothyroidism, unspecified: Secondary | ICD-10-CM | POA: Diagnosis not present

## 2019-01-24 DIAGNOSIS — R05 Cough: Secondary | ICD-10-CM | POA: Diagnosis not present

## 2019-01-24 DIAGNOSIS — I77811 Abdominal aortic ectasia: Secondary | ICD-10-CM | POA: Diagnosis not present

## 2019-01-24 DIAGNOSIS — R1010 Upper abdominal pain, unspecified: Secondary | ICD-10-CM

## 2019-01-24 DIAGNOSIS — Z87891 Personal history of nicotine dependence: Secondary | ICD-10-CM | POA: Diagnosis not present

## 2019-01-24 DIAGNOSIS — S22008A Other fracture of unspecified thoracic vertebra, initial encounter for closed fracture: Secondary | ICD-10-CM | POA: Diagnosis not present

## 2019-01-24 LAB — CBC WITH DIFFERENTIAL/PLATELET
Abs Immature Granulocytes: 0.02 10*3/uL (ref 0.00–0.07)
Basophils Absolute: 0 10*3/uL (ref 0.0–0.1)
Basophils Relative: 0 %
Eosinophils Absolute: 0.1 10*3/uL (ref 0.0–0.5)
Eosinophils Relative: 2 %
HCT: 41.2 % (ref 36.0–46.0)
Hemoglobin: 13.3 g/dL (ref 12.0–15.0)
Immature Granulocytes: 0 %
Lymphocytes Relative: 20 %
Lymphs Abs: 1.5 10*3/uL (ref 0.7–4.0)
MCH: 28.6 pg (ref 26.0–34.0)
MCHC: 32.3 g/dL (ref 30.0–36.0)
MCV: 88.6 fL (ref 80.0–100.0)
Monocytes Absolute: 0.8 10*3/uL (ref 0.1–1.0)
Monocytes Relative: 10 %
Neutro Abs: 5.3 10*3/uL (ref 1.7–7.7)
Neutrophils Relative %: 68 %
Platelets: 247 10*3/uL (ref 150–400)
RBC: 4.65 MIL/uL (ref 3.87–5.11)
RDW: 13.6 % (ref 11.5–15.5)
WBC: 7.7 10*3/uL (ref 4.0–10.5)
nRBC: 0 % (ref 0.0–0.2)

## 2019-01-24 LAB — URINALYSIS, ROUTINE W REFLEX MICROSCOPIC
Bilirubin Urine: NEGATIVE
Glucose, UA: NEGATIVE mg/dL
Hgb urine dipstick: NEGATIVE
Ketones, ur: 5 mg/dL — AB
Leukocytes,Ua: NEGATIVE
Nitrite: NEGATIVE
Protein, ur: NEGATIVE mg/dL
Specific Gravity, Urine: 1.016 (ref 1.005–1.030)
pH: 6 (ref 5.0–8.0)

## 2019-01-24 LAB — COMPREHENSIVE METABOLIC PANEL
ALT: 17 U/L (ref 0–44)
AST: 19 U/L (ref 15–41)
Albumin: 3.8 g/dL (ref 3.5–5.0)
Alkaline Phosphatase: 91 U/L (ref 38–126)
Anion gap: 8 (ref 5–15)
BUN: 15 mg/dL (ref 8–23)
CO2: 23 mmol/L (ref 22–32)
Calcium: 8.9 mg/dL (ref 8.9–10.3)
Chloride: 102 mmol/L (ref 98–111)
Creatinine, Ser: 0.74 mg/dL (ref 0.44–1.00)
GFR calc Af Amer: >60 mL/min (ref >60)
GFR calc non Af Amer: >60 mL/min (ref >60)
Glucose, Bld: 104 mg/dL — ABNORMAL HIGH (ref 70–99)
Potassium: 4.5 mmol/L (ref 3.5–5.1)
Sodium: 133 mmol/L — ABNORMAL LOW (ref 135–145)
Total Bilirubin: 0.6 mg/dL (ref 0.3–1.2)
Total Protein: 7.3 g/dL (ref 6.5–8.1)

## 2019-01-24 LAB — LIPASE, BLOOD: Lipase: 50 U/L (ref 11–51)

## 2019-01-24 MED ORDER — IOHEXOL 300 MG/ML  SOLN
100.0000 mL | Freq: Once | INTRAMUSCULAR | Status: AC | PRN
  Administered 2019-01-24: 17:00:00 100 mL via INTRAVENOUS

## 2019-01-24 MED ORDER — HYDROCODONE-ACETAMINOPHEN 5-325 MG PO TABS: ORAL_TABLET | ORAL | 0 refills | Status: DC

## 2019-01-24 MED ORDER — KETOROLAC TROMETHAMINE 30 MG/ML IJ SOLN
15.0000 mg | Freq: Once | INTRAMUSCULAR | Status: AC
  Administered 2019-01-24: 15 mg via INTRAVENOUS
  Filled 2019-01-24: qty 1

## 2019-01-24 MED ORDER — MORPHINE SULFATE (PF) 2 MG/ML IV SOLN
2.0000 mg | Freq: Once | INTRAVENOUS | Status: AC
  Administered 2019-01-24: 2 mg via INTRAVENOUS
  Filled 2019-01-24: qty 1

## 2019-01-24 MED ORDER — ONDANSETRON HCL 4 MG/2ML IJ SOLN
4.0000 mg | Freq: Once | INTRAMUSCULAR | Status: AC
  Administered 2019-01-24: 4 mg via INTRAVENOUS
  Filled 2019-01-24: qty 2

## 2019-01-24 MED ORDER — HYDROCODONE-ACETAMINOPHEN 5-325 MG PO TABS: 1.0000 | ORAL_TABLET | ORAL | 0 refills | Status: DC | PRN

## 2019-01-24 NOTE — ED Provider Notes (Signed)
Pt signed out to me by Evalee Stein, PA-C at end of shift, pending CT abd/pelvis results.    Pt is 85 yo female with upper abdominal pain.  Pain reproduced with movement and radiates around to mid back.  Pain has not been associated with fever, vomiting or diarrhea.      Dg Chest 2 View  Result Date: 01/24/2019 CLINICAL DATA:  Cough for several weeks. EXAM: CHEST - 2 VIEW COMPARISON:  12/31/2018 FINDINGS: Cardiac silhouette is normal in size. No mediastinal or hilar masses. No evidence of adenopathy. Lungs are hyperexpanded. There is mild interstitial thickening, chronic and stable. No evidence of pneumonia or pulmonary edema. No pleural effusion or pneumothorax. Mild compression fracture of a midthoracic vertebra new since the prior chest radiograph. IMPRESSION: 1. No acute cardiopulmonary disease. 2. Mild compression deformity of a midthoracic vertebra that was not evident on the prior study. Correlate with any symptoms of back pain. This could be further assessed if desired clinically with thoracic MRI. Electronically Signed   By: Lajean Manes M.D.   On: 01/24/2019 15:46   Dg Chest 2 View  Result Date: 12/31/2018 CLINICAL DATA:  Cough. EXAM: CHEST - 2 VIEW COMPARISON:  January 02, 2017 FINDINGS: Healed right rib fractures. The heart, hila, mediastinum, lungs, and pleura are normal. IMPRESSION: No active cardiopulmonary disease. Electronically Signed   By: Dorise Bullion III M.D   On: 12/31/2018 17:40   Ct Abdomen Pelvis W Contrast  Result Date: 01/24/2019 CLINICAL DATA:  Upper abdominal pain EXAM: CT ABDOMEN AND PELVIS WITH CONTRAST TECHNIQUE: Multidetector CT imaging of the abdomen and pelvis was performed using the standard protocol following bolus administration of intravenous contrast. CONTRAST:  115mL OMNIPAQUE IOHEXOL 300 MG/ML  SOLN COMPARISON:  None. FINDINGS: Lower chest: No acute abnormality. Hepatobiliary: No focal liver abnormality is seen. No gallstones, gallbladder wall thickening, or  biliary dilatation. Pancreas: Unremarkable. No pancreatic ductal dilatation or surrounding inflammatory changes. Spleen: Normal in size without focal abnormality. Adrenals/Urinary Tract: Normal appearance of the adrenal glands. The kidneys are unremarkable. No mass or hydronephrosis. Urinary bladder appears normal. Stomach/Bowel: Stomach is within normal limits. No evidence of bowel wall thickening, distention, or inflammatory changes. Appendix appears normal. Diffuse distal colonic diverticulosis noted without acute inflammation. Vascular/Lymphatic: Aortic atherosclerosis. Infrarenal abdominal aortic ectasia measures 2.6 cm. No abdominopelvic adenopathy. Reproductive: Uterus and bilateral adnexa are unremarkable. Other: No abdominal wall hernia or abnormality. No abdominopelvic ascites. Musculoskeletal: Bones appear diffusely osteopenic. Multi level degenerative disc disease noted most advanced at L4-5 and L5-S1. Bilateral hip osteoarthritis. IMPRESSION: 1. No acute abnormality within the abdomen or pelvis. 2.  Aortic Atherosclerosis (ICD10-I70.0). 3. Infrarenal abdominal aortic ectasia. Ectatic abdominal aorta at risk for aneurysm development. Recommend followup by ultrasound in 5 years. This recommendation follows ACR consensus guidelines: White Paper of the ACR Incidental Findings Committee II on Vascular Findings. J Am Coll Radiol 2013; 10:789-794. 4. Colonic diverticulosis without acute inflammation. Electronically Signed   By: Kerby Moors M.D.   On: 01/24/2019 17:36    Labs Reviewed  COMPREHENSIVE METABOLIC PANEL - Abnormal; Notable for the following components:      Result Value   Sodium 133 (*)    Glucose, Bld 104 (*)    All other components within normal limits  URINALYSIS, ROUTINE W REFLEX MICROSCOPIC - Abnormal; Notable for the following components:   Ketones, ur 5 (*)    All other components within normal limits  CBC WITH DIFFERENTIAL/PLATELET  LIPASE, BLOOD    Work up  reassuring.  Pt  is non-toxic appearing.  Discussed XR and CT abd/pelvis results.  Pain is reproduable with movement and felt to be related to the new compression fx seen on plain film. No concerning sx's for acute abdomen. NV intact and pt is ambulatory w/o deficits.  Appears appropriate for d/c home.  No aneursym, discussed the aortic ectasia and need for f/u recommended by radiologist.  Pt and family verbalized understanding and agree to plan.  Short course of pain medication prescribed.  Strict return precautions discussed.      Kem Parkinson, PA-C 01/25/19 [place], Ankit, MD 01/27/19 2003

## 2019-01-24 NOTE — Discharge Instructions (Addendum)
Please take a stool softener while taking the pain medication.  Call Dr. Juel Burrow office to arrange a follow-up appt for next week.  Return to ER for any worsening symptoms.  As discussed, your CT today shows an area of your abdominal aorta that is at a risk for developing an aneurysm.  This will need a follow-up ultrasound in 5 years per the radiology recommendations

## 2019-01-24 NOTE — ED Provider Notes (Signed)
[facility] EMERGENCY DEPARTMENT Provider Note   CSN: [id number] Arrival date & time: 01/24/19  1348    History   Chief Complaint Chief Complaint  Patient presents with  . Abdominal Pain    HPI Julie Park is a 85 y.o. female with a past medical history of anxiety, renal insufficiency history of right-sided rib fractures in 2015, diverticulosis, GERD, hypothyroidism, IBS and history of vertigo presenting with epigastric abdominal pain which started yesterday and has worsened today.  She describes sharp severe pain which is okay at rest and when supine but becomes intolerable with standing and walking.  She was a little more active yesterday describing she did some vacuuming with a very heavy vacuum cleaner and also lifted a case of water from a grocery cart to her car and then from her car to her home yesterday after which time she noted beginning mild symptoms.  Upon waking today she had great difficulty getting out of bed secondary to pain in this location.  It does radiate both into her mid back but also around her bilateral lower rib cage area.  She denies nausea or vomiting, no fevers or chills, no shortness of breath or chest pain.  She has had a mild chronic dry cough which her PCP has prescribed Tussionex which she uses sparingly as this causes constipation for her.  She has taken Tylenol with no significant improvement in her symptoms.  Other pertinent negatives include negative dizziness, dysuria or hematuria.  She has had several looser than normal stools over the past couple of days but also endorses has been eating a lot of local fruits.        The history is provided by the patient.  Abdominal Pain Associated symptoms: no chest pain, no chills, no dysuria, no fever, no nausea, no shortness of breath, no sore throat and no vomiting     Past Medical History:  Diagnosis Date  . Anxiety   . Chest pain 02/08/15   ETT normal  . Chronic renal insufficiency, stage II (mild)     CrCl 60s  . Diverticulosis    Diverticulitis (1982)  . Fracture of rib of right side 08/16/2013  . GERD (gastroesophageal reflux disease)   . Hyperlipidemia, mixed   . Hypothyroidism   . IBS (irritable bowel syndrome)   . Morton's neuroma   . Right lumbar radiculopathy    Intermittent x 2 episodes in summer 2014  . Shingles 1967 and 2010  . Ulcer   . Urine incontinence   . Vertigo     Patient Active Problem List   Diagnosis Date Noted  . GAD (generalized anxiety disorder) 07/11/2018  . Overactive bladder 07/11/2018  . Functional urinary incontinence 07/10/2018  . Cervical cancer screening 03/31/2014  . Hyponatremia 03/31/2014  . Tick bite 11/19/2013  . Cough 11/19/2013  . Colon cancer screening 09/28/2013  . Breast cancer screening 09/28/2013  . Preventative health care 09/28/2013  . Mixed hyperlipidemia   . Diverticular disease   . Anxiety state   . GERD (gastroesophageal reflux disease)   . Hypothyroidism   . Urine incontinence   . IRRITABLE BOWEL SYNDROME 03/02/2010    Past Surgical History:  Procedure Laterality Date  . CARDIOVASCULAR STRESS TEST  02/08/15   ETT normal  . CATARACT EXTRACTION  2012   Dr. Herbert Deaner  . COLONOSCOPY  2005   Dr. Arsenio Loader at [facility] (normal per pt report)  . DEXA  11/03/13   Heel T score -0.8.  Marland Kitchen FOOT  [place]   right foot  . TONSILLECTOMY    . TONSILLECTOMY AND ADENOIDECTOMY  1948  . TUBAL LIGATION  1974  . WISDOM TOOTH EXTRACTION       OB History   No obstetric history on file.      Home Medications    Prior to Admission medications   Medication Sig Start Date End Date Taking? Authorizing Provider  acetaminophen (TYLENOL) 500 MG tablet Take 1,000 mg by mouth every 6 (six) hours as needed for headache.    Yes [provider]  aspirin 81 MG tablet Take 81 mg by mouth every morning.    Yes [provider]  atorvastatin (LIPITOR) 20 MG tablet TAKE 1 PILL DAILY OR AS DIRECTED Patient taking  differently: Take 10 mg by mouth daily at 6 PM.  02/16/14  Yes McGowen, Adrian Blackwater, MD  [facility] MAGNESIUM 500 MG TABS Take 0.5 tablets by mouth 2 (two) times a day.  09/30/18  Yes [provider]  ketotifen (ZADITOR) 0.025 % ophthalmic solution Place 1 drop into both eyes daily as needed (for irritation.).   Yes [provider]  levothyroxine (SYNTHROID, LEVOTHROID) 88 MCG tablet Take 1 tablet (88 mcg total) by mouth daily. 09/14/14  Yes McGowen, Adrian Blackwater, MD  LORazepam (ATIVAN) 1 MG tablet Take 1 tablet (1 mg total) by mouth every 8 (eight) hours. Patient taking differently: Take 1 mg by mouth every 8 (eight) hours as needed for anxiety.  08/04/15  Yes McGowen, Adrian Blackwater, MD  lubiprostone (AMITIZA) 8 MCG capsule TAKE 1 CAPSULE (8 MCG TOTAL) BY MOUTH 2 (TWO) TIMES DAILY WITH A MEAL. 09/11/18  Yes Rakes, Connye Burkitt, FNP  omeprazole (PRILOSEC) 40 MG capsule Take by mouth.   Yes [provider]  polyvinyl alcohol (LIQUIFILM TEARS) 1.4 % ophthalmic solution Place 1 drop into both eyes as needed for dry eyes.   Yes [provider]  psyllium (HYDROCIL/METAMUCIL) 95 % PACK Take 1 packet by mouth daily.   Yes [provider]  solifenacin (VESICARE) 5 MG tablet TAKE 1 TABLET (5 MG TOTAL) BY MOUTH DAILY. Patient not taking: Reported on 01/24/2019 05/11/16   McGowen, Adrian Blackwater, MD    Family History Family History  Problem Relation Age of Onset  . Cancer Father        pancreatic  . Asthma Daughter   . Heart attack Paternal Grandfather     Social History Social History   Tobacco Use  . Smoking status: Former Smoker    Packs/day: 1.00    Years: 8.00    Pack years: 8.00    Types: Cigarettes    Start date: 06/26/1983  . Smokeless tobacco: Never Used  Substance Use Topics  . Alcohol use: No  . Drug use: No     Allergies   Patient has no known allergies.   Review of Systems Review of Systems  Constitutional: Negative for chills and fever.  HENT: Negative for  congestion and sore throat.   Eyes: Negative.   Respiratory: Negative for chest tightness and shortness of breath.   Cardiovascular: Negative for chest pain.  Gastrointestinal: Positive for abdominal pain. Negative for nausea and vomiting.  Genitourinary: Negative.  Negative for dysuria.  Musculoskeletal: Negative for arthralgias, joint swelling and neck pain.  Skin: Negative.  Negative for rash and wound.  Neurological: Negative for dizziness, weakness, light-headedness, numbness and headaches.  Psychiatric/Behavioral: Negative.      Physical Exam Updated Vital Signs BP (!) 145/70  Pulse 74   Temp 98.3 F (36.8 C) (Oral)   Resp 20   Ht 5\' 5"  (1.651 m)   Wt 72.1 kg   SpO2 100%   BMI 26.46 kg/m   Physical Exam Vitals signs and nursing note reviewed.  Constitutional:      Appearance: She is well-developed.  HENT:     Head: Normocephalic and atraumatic.  Eyes:     Conjunctiva/sclera: Conjunctivae normal.  Neck:     Musculoskeletal: Normal range of motion.  Cardiovascular:     Rate and Rhythm: Normal rate and regular rhythm.     Heart sounds: Normal heart sounds.  Pulmonary:     Effort: Pulmonary effort is normal.     Breath sounds: Normal breath sounds. No wheezing.  Abdominal:     General: Abdomen is protuberant. Bowel sounds are normal. There is no distension.     Palpations: Abdomen is soft.     Tenderness: There is abdominal tenderness in the epigastric area. There is no right CVA tenderness, left CVA tenderness, guarding or rebound. Negative signs include Murphy's sign.     Hernia: No hernia is present.  Musculoskeletal: Normal range of motion.     Cervical back: She exhibits no bony tenderness.     Thoracic back: She exhibits no bony tenderness.     Lumbar back: She exhibits no bony tenderness.  Skin:    General: Skin is warm and dry.  Neurological:     Mental Status: She is alert.      ED Treatments / Results  Labs (all labs ordered are listed, but  only abnormal results are displayed) Labs Reviewed  COMPREHENSIVE METABOLIC PANEL - Abnormal; Notable for the following components:      Result Value   Sodium 133 (*)    Glucose, Bld 104 (*)    All other components within normal limits  URINALYSIS, ROUTINE W REFLEX MICROSCOPIC - Abnormal; Notable for the following components:   Ketones, ur 5 (*)    All other components within normal limits  CBC WITH DIFFERENTIAL/PLATELET  LIPASE, BLOOD    EKG EKG Interpretation  Date/Time:  Saturday January 24 2019 14:15:38 EDT Ventricular Rate:  82 PR Interval:    QRS Duration: 80 QT Interval:  363 QTC Calculation: 424 R Axis:   21 Text Interpretation:  Sinus rhythm Confirmed by Gerlene Fee [phone]) on 01/24/2019 3:19:39 PM   Radiology Dg Chest 2 View  Result Date: 01/24/2019 CLINICAL DATA:  Cough for several weeks. EXAM: CHEST - 2 VIEW COMPARISON:  12/31/2018 FINDINGS: Cardiac silhouette is normal in size. No mediastinal or hilar masses. No evidence of adenopathy. Lungs are hyperexpanded. There is mild interstitial thickening, chronic and stable. No evidence of pneumonia or pulmonary edema. No pleural effusion or pneumothorax. Mild compression fracture of a midthoracic vertebra new since the prior chest radiograph. IMPRESSION: 1. No acute cardiopulmonary disease. 2. Mild compression deformity of a midthoracic vertebra that was not evident on the prior study. Correlate with any symptoms of back pain. This could be further assessed if desired clinically with thoracic MRI. Electronically Signed   By: Lajean Manes M.D.   On: 01/24/2019 15:46    Procedures Procedures (including critical care time)  Medications Ordered in ED Medications  morphine 2 MG/ML injection 2 mg (has no administration in time range)  ondansetron (ZOFRAN) injection 4 mg (has no administration in time range)  ketorolac (TORADOL) 30 MG/ML injection 15 mg (15 mg Intravenous Given 01/24/19 1555)     Initial  Impression /  Assessment and Plan / ED Course  I have reviewed the triage vital signs and the nursing notes.  Pertinent labs & imaging results that were available during my care of the patient were reviewed by me and considered in my medical decision making (see chart for details).        Pt with epigastric pain which is reproducible with palpation including along her lower anterior ribs without palpable deformity.  Distant h/o of rib fx (2015).  cxr with new mid thoracic compression fx but is not tender at this site.   Sharp/pulling sensation anteriorly with movement especially attempts to stand upright, probably musculoskeletal.  Evaluated with Dr Sedonia Small who agrees with prob msk source. However, given age will CT abd/pelvis to assess for hernia, aortic pathology , abd wall hematoma or other possible source of pain.   If CT imaging negative, plan f/u with pcp for further management of thoracic fx, pain medication.    Pt signed out to Kem Parkinson, PA-C  Final Clinical Impressions(s) / ED Diagnoses   Final diagnoses:  Compression fracture of thoracic vertebra, initial encounter, unspecified thoracic vertebral level [facility])  Epigastric pain    ED Discharge Orders    None       Landis Martins 01/24/19 1718    Maudie Flakes, MD 01/29/19 [phone]

## 2019-01-24 NOTE — ED Triage Notes (Signed)
Pt reports she has been having upper abd pain, especially in epigastric and right upper quadrant with no n/v/d.

## 2019-01-26 DIAGNOSIS — S22009D Unspecified fracture of unspecified thoracic vertebra, subsequent encounter for fracture with routine healing: Secondary | ICD-10-CM | POA: Diagnosis not present

## 2019-01-26 MED FILL — Hydrocodone-Acetaminophen Tab 5-325 MG: ORAL | Qty: 6 | Status: AC

## 2019-01-30 ENCOUNTER — Other Ambulatory Visit: Payer: Self-pay

## 2019-01-30 ENCOUNTER — Ambulatory Visit (INDEPENDENT_AMBULATORY_CARE_PROVIDER_SITE_OTHER): Payer: Medicare HMO | Admitting: Orthopedic Surgery

## 2019-01-30 VITALS — BP 137/89 | HR 72 | Temp 97.8°F | Ht 65.0 in | Wt 154.0 lb

## 2019-01-30 DIAGNOSIS — S22000A Wedge compression fracture of unspecified thoracic vertebra, initial encounter for closed fracture: Secondary | ICD-10-CM | POA: Diagnosis not present

## 2019-01-30 DIAGNOSIS — M4854XA Collapsed vertebra, not elsewhere classified, thoracic region, initial encounter for fracture: Secondary | ICD-10-CM

## 2019-01-30 MED ORDER — OXYCODONE-ACETAMINOPHEN 5-325 MG PO TABS: 1.0000 | ORAL_TABLET | Freq: Four times a day (QID) | ORAL | 0 refills | Status: AC | PRN

## 2019-01-30 NOTE — Patient Instructions (Addendum)
Thoracic Spine Fracture A thoracic spine fracture is a break in one of the bones of the middle part of the back. The fracture can be mild or very bad. The most serious types cause the broken bones to:  Move out of place (unstable).  Damage or press on the main nerve in the spine (spinal cord). In some cases, the bone that connects to the lower part of the back may also have a break (thoracolumbar fracture). What are the causes? This condition may be caused by:  A car accident.  A fall.  A sports accident.  Violent acts. These include assaults or gunshots. What are the signs or symptoms? Symptoms may include:  Back pain.  Trouble standing or walking.  Numbness.  Tingling.  Weakness.  Loss of movement.  Being unable to control when to pee or poop (incontinence). How is this treated? Treatment may include:  Medicines.  A cast or a brace.  Physical therapy.  Surgery. This may be needed for very bad fractures. Follow these instructions at home: Medicines  Take medicines only as told by your doctor.  Do not drive or use heavy machinery while taking pain medicine.  To prevent or treat trouble pooping (constipation) while you are taking prescription pain medicine, your doctor may recommend that you: ? Drink enough fluid to keep your pee (urine) pale yellow. ? Take over-the-counter or prescription medicines. ? Eat foods that are high in fiber. This includes fresh fruits and vegetables, whole grains, and beans. ? Limit foods that are high in fat and processed sugars. This includes fried or sweet foods. If you have a brace:  Wear the back brace as told by your doctor. Remove it only as told by your doctor.  Keep the brace clean.  If the brace is not waterproof: ? Do not let it get wet. ? Cover it with a watertight covering when you take a bath or a shower. Activity  Stay in bed (on bed rest) only as told by your doctor.  Ask your doctor what is safe for you to  do.  Return to your normal activities as told by your doctor.  Do back exercises (physical therapy) as told by your doctor.  Exercise often as told by your doctor. Managing pain, stiffness, and swelling   If told, put ice on the injured area: ? Put ice in a plastic bag. ? Place a towel between your skin and the bag. ? Leave the ice on for 20 minutes, 2-3 times a day. General instructions  Do not use any products that contain nicotine or tobacco, such as cigarettes and e-cigarettes. If you need help quitting, ask your doctor.  Do not drink alcohol.  Keep all follow-up visits as told by your doctor. This is important. Contact a doctor if:  You have a fever.  You have a cough that makes your pain worse.  Your pain medicine is not helping.  Your pain does not get better over time.  You cannot return to your normal activities as planned. Get help right away if:  Your pain is bad and it suddenly gets worse.  You are not able to move any part of your body (paralysis) that is below the level of your injury.  You have numbness, tingling, or weakness in any part of your body that is below the level of your injury.  You cannot control when you pee (urinate) or when you poop (pass stool). Summary  A thoracic spine fracture is a break  in one of the bones of the middle part of the back.  A stable fracture can be treated with a back brace, activity restrictions, pain medicine, and physical therapy. A more severe fracture may require surgery.  Make sure you know what symptoms should cause you to get help right away. This information is not intended to replace advice given to you by your health care provider. Make sure you discuss any questions you have with your health care provider. Document Released: 11/29/2009 Document Revised: 07/26/2017 Document Reviewed: 07/26/2017 Elsevier Patient Education  2020 Julie Park WILL NEED A LETTER: MEDICAL REQUEST TO MOVE MAILBOX DUE  TO SEVERE ORTHOPAEDIC CONDITION

## 2019-01-30 NOTE — Progress Notes (Signed)
Julie Park  01/30/2019  HISTORY SECTION :  Chief Complaint  Patient presents with  . Back Pain    Thoracic fracture, referred by Dr. Nevada Crane.   HPI The patient presents for evaluation of midthoracic back pain  Duration 8 days Quality dull ache radiating around to the chest wall and anterior abdomen 9 out of 10 Associated with some abdominal pain CAT scan showed abdominal aneurysm  Review of Systems  Constitutional: Negative for chills and fever.  Musculoskeletal: Positive for falls.  Neurological: Negative for tingling, sensory change and focal weakness.     Past Medical History:  Diagnosis Date  . Anxiety   . Chest pain 02/08/15   ETT normal  . Chronic renal insufficiency, stage II (mild)    CrCl 60s  . Diverticulosis    Diverticulitis (1982)  . Fracture of rib of right side 08/16/2013  . GERD (gastroesophageal reflux disease)   . Hyperlipidemia, mixed   . Hypothyroidism   . IBS (irritable bowel syndrome)   . Morton's neuroma   . Right lumbar radiculopathy    Intermittent x 2 episodes in summer 2014  . Shingles 1967 and 2010  . Ulcer   . Urine incontinence   . Vertigo     Past Surgical History:  Procedure Laterality Date  . CARDIOVASCULAR STRESS TEST  02/08/15   ETT normal  . CATARACT EXTRACTION  2012   Dr. Herbert Deaner  . COLONOSCOPY  2005   [facility]. Arsenio Loader at [facility] (normal per pt report)  . DEXA  11/03/13   Heel T score -0.8.  Marland Kitchen [place]   right foot  . TONSILLECTOMY    . TONSILLECTOMY AND ADENOIDECTOMY  1948  . TUBAL LIGATION  1974  . WISDOM TOOTH EXTRACTION       No Known Allergies   Current Outpatient Medications:  .  acetaminophen (TYLENOL) 500 MG tablet, Take 1,000 mg by mouth every 6 (six) hours as needed for headache. , Disp: , Rfl:  .  aspirin 81 MG tablet, Take 81 mg by mouth every morning. , Disp: , Rfl:  .  atorvastatin (LIPITOR) 20 MG tablet, TAKE 1 PILL DAILY OR AS DIRECTED (Patient taking differently: Take 10 mg by mouth  daily at 6 PM. ), Disp: 90 tablet, Rfl: 3 .  [facility] MAGNESIUM 500 MG TABS, Take 0.5 tablets by mouth 2 (two) times a day. , Disp: , Rfl:  .  ketotifen (ZADITOR) 0.025 % ophthalmic solution, Place 1 drop into both eyes daily as needed (for irritation.)., Disp: , Rfl:  .  levothyroxine (SYNTHROID, LEVOTHROID) 88 MCG tablet, Take 1 tablet (88 mcg total) by mouth daily., Disp: 90 tablet, Rfl: 3 .  LORazepam (ATIVAN) 1 MG tablet, Take 1 tablet (1 mg total) by mouth every 8 (eight) hours. (Patient taking differently: Take 1 mg by mouth every 8 (eight) hours as needed for anxiety. ), Disp: 90 tablet, Rfl: 5 .  lubiprostone (AMITIZA) 8 MCG capsule, TAKE 1 CAPSULE (8 MCG TOTAL) BY MOUTH 2 (TWO) TIMES DAILY WITH A MEAL., Disp: 60 capsule, Rfl: 3 .  omeprazole (PRILOSEC) 40 MG capsule, Take by mouth., Disp: , Rfl:  .  polyvinyl alcohol (LIQUIFILM TEARS) 1.4 % ophthalmic solution, Place 1 drop into both eyes as needed for dry eyes., Disp: , Rfl:  .  psyllium (HYDROCIL/METAMUCIL) 95 % PACK, Take 1 packet by mouth daily., Disp: , Rfl:  .  HYDROcodone-acetaminophen (NORCO/VICODIN) 5-325 MG tablet, Take 1 tablet by mouth every 4 (four)  hours as needed. (Patient not taking: Reported on 01/30/2019), Disp: 6 tablet, Rfl: 0 .  HYDROcodone-acetaminophen (NORCO/VICODIN) 5-325 MG tablet, Take one tab po q 4 hrs prn pain (Patient not taking: Reported on 01/30/2019), Disp: 6 tablet, Rfl: 0 .  oxyCODONE-acetaminophen (PERCOCET/ROXICET) 5-325 MG tablet, Take 1 tablet by mouth every 6 (six) hours as needed for up to 7 days for severe pain., Disp: 28 tablet, Rfl: 0 .  solifenacin (VESICARE) 5 MG tablet, TAKE 1 TABLET (5 MG TOTAL) BY MOUTH DAILY. (Patient not taking: Reported on 01/24/2019), Disp: 90 tablet, Rfl: 0   PHYSICAL EXAM SECTION: 1) BP 137/89   Pulse 72   Temp 97.8 F (36.6 C)   Ht 5\' 5"  (1.651 m)   Wt 154 lb (69.9 kg)   BMI 25.63 kg/m   Body mass index is 25.63 kg/m. General appearance: Well-developed well-nourished no  gross deformities  2) Cardiovascular normal pulse and perfusion in all 4 extremities normal color without edema  3) Neurologically deep tendon reflexes are equal and normal, no sensation loss or deficits no pathologic reflexes  4) Psychological: Awake alert and oriented x3 mood and affect normal  5) Skin no lacerations or ulcerations no nodularity no palpable masses, no erythema or nodularity  6) Musculoskeletal:   Gait was was abnormal.  She had poor balance for stride length for cadence  She was tender near the fracture site which is approximate mid thoracic region at the kyphosis area  She was also tenderness in her lower back consistent with degenerative changes seen on CT scan  Right and left lower extremities have normal strength normal reflexes normal sensation  MEDICAL DECISION SECTION:  Encounter Diagnosis  Name Primary?  . Non-traumatic compression fracture of tenth thoracic vertebra, initial encounter [facility]) Yes    Imaging CT scan revealed an abdominal aortic aneurysm 2.6 cm with a follow-up recommended for 5 years, I reviewed this with the patient and her daughter  She will need an x-ray in 6 weeks  She is severely deconditioned and has poor balance when walking  Plan:  (Rx., Inj., surg., Frx, MRI/CT, XR:2)  Meds ordered this encounter  Medications  . oxyCODONE-acetaminophen (PERCOCET/ROXICET) 5-325 MG tablet    Sig: Take 1 tablet by mouth every 6 (six) hours as needed for up to 7 days for severe pain.    Dispense:  28 tablet    Refill:  0    We have ordered physical therapy for the patient we have ordered a walker we have ordered a brace we have ordered Percocet we have ordered a mailbox moving statement    11:11 AM Arther Abbott, MD  01/30/2019

## 2019-02-03 ENCOUNTER — Telehealth: Payer: Self-pay | Admitting: Orthopedic Surgery

## 2019-02-03 DIAGNOSIS — M4854XA Collapsed vertebra, not elsewhere classified, thoracic region, initial encounter for fracture: Secondary | ICD-10-CM

## 2019-02-03 NOTE — Telephone Encounter (Signed)
I am not a nurse, but I called her.  Julie Park wants to know about constipation she has tried the Senokot / I have advised her to try Miralax and see if this helps if not call PCP  Wants to know if you can [facility] [place] / since the Oxydodone is constipating her and is very strong

## 2019-02-03 NOTE — Telephone Encounter (Signed)
Julie Park and her daughter Lenna Sciara called and left a message asking Dr Ruthe Mannan nurse to call them.  She stated that they have some questions.  Would you give her a call please at [phone]  Thanks

## 2019-02-03 NOTE — Telephone Encounter (Signed)
Tramadol yes   everything else is appropriate

## 2019-02-05 ENCOUNTER — Other Ambulatory Visit: Payer: Self-pay | Admitting: Orthopedic Surgery

## 2019-02-05 DIAGNOSIS — R69 Illness, unspecified: Secondary | ICD-10-CM | POA: Diagnosis not present

## 2019-02-05 DIAGNOSIS — M5416 Radiculopathy, lumbar region: Secondary | ICD-10-CM | POA: Diagnosis not present

## 2019-02-05 DIAGNOSIS — I700 Atherosclerosis of aorta: Secondary | ICD-10-CM | POA: Diagnosis not present

## 2019-02-05 DIAGNOSIS — K219 Gastro-esophageal reflux disease without esophagitis: Secondary | ICD-10-CM | POA: Diagnosis not present

## 2019-02-05 DIAGNOSIS — N182 Chronic kidney disease, stage 2 (mild): Secondary | ICD-10-CM | POA: Diagnosis not present

## 2019-02-05 DIAGNOSIS — E782 Mixed hyperlipidemia: Secondary | ICD-10-CM | POA: Diagnosis not present

## 2019-02-05 DIAGNOSIS — K5730 Diverticulosis of large intestine without perforation or abscess without bleeding: Secondary | ICD-10-CM | POA: Diagnosis not present

## 2019-02-05 DIAGNOSIS — G5760 Lesion of plantar nerve, unspecified lower limb: Secondary | ICD-10-CM | POA: Diagnosis not present

## 2019-02-05 DIAGNOSIS — E039 Hypothyroidism, unspecified: Secondary | ICD-10-CM | POA: Diagnosis not present

## 2019-02-05 DIAGNOSIS — M4854XD Collapsed vertebra, not elsewhere classified, thoracic region, subsequent encounter for fracture with routine healing: Secondary | ICD-10-CM | POA: Diagnosis not present

## 2019-02-05 NOTE — Telephone Encounter (Signed)
Per phone note 02/03/19, Dr Aline Brochure approved Tramadol. Patient/daughter/designated contact [place] said pharmacy does not yet have the prescription. [facility] [place].

## 2019-02-05 NOTE — Telephone Encounter (Signed)
Patient/daughter, designated contact person, Lenna Sciara, aware Tramadol approved per Dr Aline Brochure. Daughter also asking for  -a copy of the walker prescription. States will need to send it to insurance for reimbursement, as paid for it out of pocket.  -a letter to send to the [place] post office requesting for a move of patient's mailbox to her side of the street so that she will not have to go across the street for her mail Please advise.

## 2019-02-06 MED ORDER — TRAMADOL HCL 50 MG PO TABS: 50.0000 mg | ORAL_TABLET | Freq: Four times a day (QID) | ORAL | 0 refills | Status: DC | PRN

## 2019-02-06 NOTE — Telephone Encounter (Signed)
Ok on all

## 2019-02-10 ENCOUNTER — Encounter: Payer: Self-pay | Admitting: Orthopedic Surgery

## 2019-02-10 NOTE — Telephone Encounter (Signed)
As of 02/06/19, patient/contact person, daughter Lenna Sciara aware.

## 2019-02-11 DIAGNOSIS — M5416 Radiculopathy, lumbar region: Secondary | ICD-10-CM | POA: Diagnosis not present

## 2019-02-11 DIAGNOSIS — K219 Gastro-esophageal reflux disease without esophagitis: Secondary | ICD-10-CM | POA: Diagnosis not present

## 2019-02-11 DIAGNOSIS — K5730 Diverticulosis of large intestine without perforation or abscess without bleeding: Secondary | ICD-10-CM | POA: Diagnosis not present

## 2019-02-11 DIAGNOSIS — R69 Illness, unspecified: Secondary | ICD-10-CM | POA: Diagnosis not present

## 2019-02-11 DIAGNOSIS — E782 Mixed hyperlipidemia: Secondary | ICD-10-CM | POA: Diagnosis not present

## 2019-02-11 DIAGNOSIS — G5760 Lesion of plantar nerve, unspecified lower limb: Secondary | ICD-10-CM | POA: Diagnosis not present

## 2019-02-11 DIAGNOSIS — M4854XD Collapsed vertebra, not elsewhere classified, thoracic region, subsequent encounter for fracture with routine healing: Secondary | ICD-10-CM | POA: Diagnosis not present

## 2019-02-11 DIAGNOSIS — E039 Hypothyroidism, unspecified: Secondary | ICD-10-CM | POA: Diagnosis not present

## 2019-02-11 DIAGNOSIS — I700 Atherosclerosis of aorta: Secondary | ICD-10-CM | POA: Diagnosis not present

## 2019-02-11 DIAGNOSIS — N182 Chronic kidney disease, stage 2 (mild): Secondary | ICD-10-CM | POA: Diagnosis not present

## 2019-02-11 NOTE — Telephone Encounter (Signed)
Mailed to patient per request of daughters/designated contacts [place] and [place] (signed authorization on file) to patient c/o address [place], [place], [place] [address] / done.

## 2019-02-13 DIAGNOSIS — M5416 Radiculopathy, lumbar region: Secondary | ICD-10-CM | POA: Diagnosis not present

## 2019-02-13 DIAGNOSIS — G5760 Lesion of plantar nerve, unspecified lower limb: Secondary | ICD-10-CM | POA: Diagnosis not present

## 2019-02-13 DIAGNOSIS — I700 Atherosclerosis of aorta: Secondary | ICD-10-CM | POA: Diagnosis not present

## 2019-02-13 DIAGNOSIS — E039 Hypothyroidism, unspecified: Secondary | ICD-10-CM | POA: Diagnosis not present

## 2019-02-13 DIAGNOSIS — R69 Illness, unspecified: Secondary | ICD-10-CM | POA: Diagnosis not present

## 2019-02-13 DIAGNOSIS — K5730 Diverticulosis of large intestine without perforation or abscess without bleeding: Secondary | ICD-10-CM | POA: Diagnosis not present

## 2019-02-13 DIAGNOSIS — N182 Chronic kidney disease, stage 2 (mild): Secondary | ICD-10-CM | POA: Diagnosis not present

## 2019-02-13 DIAGNOSIS — E782 Mixed hyperlipidemia: Secondary | ICD-10-CM | POA: Diagnosis not present

## 2019-02-13 DIAGNOSIS — M4854XD Collapsed vertebra, not elsewhere classified, thoracic region, subsequent encounter for fracture with routine healing: Secondary | ICD-10-CM | POA: Diagnosis not present

## 2019-02-13 DIAGNOSIS — K219 Gastro-esophageal reflux disease without esophagitis: Secondary | ICD-10-CM | POA: Diagnosis not present

## 2019-02-16 ENCOUNTER — Other Ambulatory Visit: Payer: Self-pay | Admitting: Orthopedic Surgery

## 2019-02-16 DIAGNOSIS — E782 Mixed hyperlipidemia: Secondary | ICD-10-CM | POA: Diagnosis not present

## 2019-02-16 DIAGNOSIS — M4854XD Collapsed vertebra, not elsewhere classified, thoracic region, subsequent encounter for fracture with routine healing: Secondary | ICD-10-CM | POA: Diagnosis not present

## 2019-02-16 DIAGNOSIS — I700 Atherosclerosis of aorta: Secondary | ICD-10-CM | POA: Diagnosis not present

## 2019-02-16 DIAGNOSIS — K219 Gastro-esophageal reflux disease without esophagitis: Secondary | ICD-10-CM | POA: Diagnosis not present

## 2019-02-16 DIAGNOSIS — K5730 Diverticulosis of large intestine without perforation or abscess without bleeding: Secondary | ICD-10-CM | POA: Diagnosis not present

## 2019-02-16 DIAGNOSIS — E039 Hypothyroidism, unspecified: Secondary | ICD-10-CM | POA: Diagnosis not present

## 2019-02-16 DIAGNOSIS — N182 Chronic kidney disease, stage 2 (mild): Secondary | ICD-10-CM | POA: Diagnosis not present

## 2019-02-16 DIAGNOSIS — M5416 Radiculopathy, lumbar region: Secondary | ICD-10-CM | POA: Diagnosis not present

## 2019-02-16 DIAGNOSIS — G5760 Lesion of plantar nerve, unspecified lower limb: Secondary | ICD-10-CM | POA: Diagnosis not present

## 2019-02-16 DIAGNOSIS — R69 Illness, unspecified: Secondary | ICD-10-CM | POA: Diagnosis not present

## 2019-02-16 MED ORDER — TRAMADOL HCL 50 MG PO TABS: 50.0000 mg | ORAL_TABLET | Freq: Four times a day (QID) | ORAL | 0 refills | Status: DC | PRN

## 2019-02-16 NOTE — Telephone Encounter (Signed)
[facility] [place], faxed request for refill (patient also called to follow up) for medication: Tramadol HCL 50mg  Tablet, 1 tablet by mouth every 6 hours as needed

## 2019-02-16 NOTE — Telephone Encounter (Signed)
Mailed

## 2019-02-16 NOTE — Telephone Encounter (Signed)
Per Dr Ruthe Mannan approval for copy of walker prescription, patient requests it for purpose of reimbursement by insurance.

## 2019-02-16 NOTE — Telephone Encounter (Signed)
Have reprinted the Rx for the Gilford Rile will mail to patient when signed

## 2019-03-03 ENCOUNTER — Telehealth: Payer: Self-pay | Admitting: Orthopedic Surgery

## 2019-03-03 ENCOUNTER — Telehealth: Payer: Self-pay | Admitting: Radiology

## 2019-03-03 MED ORDER — TRAMADOL HCL 50 MG PO TABS: 50.0000 mg | ORAL_TABLET | Freq: Four times a day (QID) | ORAL | 0 refills | Status: DC | PRN

## 2019-03-03 NOTE — Telephone Encounter (Signed)
Patient called for refill:  traMADol (ULTRAM) 50 MG tablet 28 tablet  - [facility], [place]

## 2019-03-03 NOTE — Telephone Encounter (Signed)
Patient called, LM asking for a tramadol refill, asked that we call her to discuss this and where to send it to.

## 2019-03-04 DIAGNOSIS — N182 Chronic kidney disease, stage 2 (mild): Secondary | ICD-10-CM | POA: Diagnosis not present

## 2019-03-04 DIAGNOSIS — M4854XD Collapsed vertebra, not elsewhere classified, thoracic region, subsequent encounter for fracture with routine healing: Secondary | ICD-10-CM | POA: Diagnosis not present

## 2019-03-04 DIAGNOSIS — E039 Hypothyroidism, unspecified: Secondary | ICD-10-CM | POA: Diagnosis not present

## 2019-03-04 DIAGNOSIS — G5760 Lesion of plantar nerve, unspecified lower limb: Secondary | ICD-10-CM | POA: Diagnosis not present

## 2019-03-04 DIAGNOSIS — E782 Mixed hyperlipidemia: Secondary | ICD-10-CM | POA: Diagnosis not present

## 2019-03-04 DIAGNOSIS — R69 Illness, unspecified: Secondary | ICD-10-CM | POA: Diagnosis not present

## 2019-03-04 DIAGNOSIS — K219 Gastro-esophageal reflux disease without esophagitis: Secondary | ICD-10-CM | POA: Diagnosis not present

## 2019-03-04 DIAGNOSIS — M5416 Radiculopathy, lumbar region: Secondary | ICD-10-CM | POA: Diagnosis not present

## 2019-03-04 DIAGNOSIS — K5730 Diverticulosis of large intestine without perforation or abscess without bleeding: Secondary | ICD-10-CM | POA: Diagnosis not present

## 2019-03-04 DIAGNOSIS — I700 Atherosclerosis of aorta: Secondary | ICD-10-CM | POA: Diagnosis not present

## 2019-03-12 DIAGNOSIS — R69 Illness, unspecified: Secondary | ICD-10-CM | POA: Diagnosis not present

## 2019-03-12 DIAGNOSIS — E039 Hypothyroidism, unspecified: Secondary | ICD-10-CM | POA: Diagnosis not present

## 2019-03-12 DIAGNOSIS — E782 Mixed hyperlipidemia: Secondary | ICD-10-CM | POA: Diagnosis not present

## 2019-03-13 ENCOUNTER — Ambulatory Visit: Payer: Medicare HMO

## 2019-03-13 ENCOUNTER — Other Ambulatory Visit: Payer: Self-pay

## 2019-03-13 ENCOUNTER — Ambulatory Visit (INDEPENDENT_AMBULATORY_CARE_PROVIDER_SITE_OTHER): Payer: Medicare HMO | Admitting: Orthopedic Surgery

## 2019-03-13 VITALS — BP 131/79 | HR 92 | Temp 97.9°F | Ht 65.0 in | Wt 154.0 lb

## 2019-03-13 DIAGNOSIS — M4854XD Collapsed vertebra, not elsewhere classified, thoracic region, subsequent encounter for fracture with routine healing: Secondary | ICD-10-CM

## 2019-03-13 MED ORDER — TRAMADOL HCL 50 MG PO TABS: 50.0000 mg | ORAL_TABLET | Freq: Four times a day (QID) | ORAL | 0 refills | Status: DC | PRN

## 2019-03-13 NOTE — Progress Notes (Signed)
Chief Complaint  Patient presents with  . Follow-up    Compression fracture T10   Brace for compression fracture patient is sleeping through the night with 1 tramadol  She also have some pain that radiates from the back into the abdominal area.  Note she has been cautioned about warning signs for abdominal aortic aneurysm that was found incidentally on CAT scan  It is 2.6 cm  She has no tenderness in the fracture site area  She is ambulating.  We adjusted her brace   Encounter Diagnosis  Name Primary?  . Non-traumatic compression fracture of T10 thoracic vertebra with routine healing, subsequent encounter Yes    X-ray was obtained that shows almost no progression of the fracture  6-week clinical exam only refill tramadol Meds ordered this encounter  Medications  . traMADol (ULTRAM) 50 MG tablet    Sig: Take 1 tablet (50 mg total) by mouth every 6 (six) hours as needed.    Dispense:  28 tablet    Refill:  0

## 2019-03-18 DIAGNOSIS — M4854XD Collapsed vertebra, not elsewhere classified, thoracic region, subsequent encounter for fracture with routine healing: Secondary | ICD-10-CM | POA: Diagnosis not present

## 2019-03-18 DIAGNOSIS — E782 Mixed hyperlipidemia: Secondary | ICD-10-CM | POA: Diagnosis not present

## 2019-03-18 DIAGNOSIS — R69 Illness, unspecified: Secondary | ICD-10-CM | POA: Diagnosis not present

## 2019-03-18 DIAGNOSIS — E039 Hypothyroidism, unspecified: Secondary | ICD-10-CM | POA: Diagnosis not present

## 2019-03-18 DIAGNOSIS — I700 Atherosclerosis of aorta: Secondary | ICD-10-CM | POA: Diagnosis not present

## 2019-03-18 DIAGNOSIS — N182 Chronic kidney disease, stage 2 (mild): Secondary | ICD-10-CM | POA: Diagnosis not present

## 2019-03-18 DIAGNOSIS — M5416 Radiculopathy, lumbar region: Secondary | ICD-10-CM | POA: Diagnosis not present

## 2019-03-18 DIAGNOSIS — G5760 Lesion of plantar nerve, unspecified lower limb: Secondary | ICD-10-CM | POA: Diagnosis not present

## 2019-03-18 DIAGNOSIS — K5730 Diverticulosis of large intestine without perforation or abscess without bleeding: Secondary | ICD-10-CM | POA: Diagnosis not present

## 2019-03-18 DIAGNOSIS — K219 Gastro-esophageal reflux disease without esophagitis: Secondary | ICD-10-CM | POA: Diagnosis not present

## 2019-03-24 ENCOUNTER — Other Ambulatory Visit (HOSPITAL_COMMUNITY): Payer: Self-pay | Admitting: Internal Medicine

## 2019-03-24 DIAGNOSIS — Z1231 Encounter for screening mammogram for malignant neoplasm of breast: Secondary | ICD-10-CM

## 2019-03-30 ENCOUNTER — Other Ambulatory Visit: Payer: Self-pay | Admitting: Orthopedic Surgery

## 2019-03-30 DIAGNOSIS — M4854XD Collapsed vertebra, not elsewhere classified, thoracic region, subsequent encounter for fracture with routine healing: Secondary | ICD-10-CM

## 2019-03-30 MED ORDER — TRAMADOL HCL 50 MG PO TABS: 50.0000 mg | ORAL_TABLET | Freq: Four times a day (QID) | ORAL | 0 refills | Status: DC | PRN

## 2019-03-30 NOTE — Telephone Encounter (Signed)
Patient requests refill on Tramadol 50 mgs.  Qty 28  Sig: Take 1 tablet (50 mg total) by mouth every 6 (six) hours as needed.  [facility] [place] [place]

## 2019-04-01 DIAGNOSIS — E782 Mixed hyperlipidemia: Secondary | ICD-10-CM | POA: Diagnosis not present

## 2019-04-01 DIAGNOSIS — I700 Atherosclerosis of aorta: Secondary | ICD-10-CM | POA: Diagnosis not present

## 2019-04-01 DIAGNOSIS — N182 Chronic kidney disease, stage 2 (mild): Secondary | ICD-10-CM | POA: Diagnosis not present

## 2019-04-01 DIAGNOSIS — K5730 Diverticulosis of large intestine without perforation or abscess without bleeding: Secondary | ICD-10-CM | POA: Diagnosis not present

## 2019-04-01 DIAGNOSIS — G5760 Lesion of plantar nerve, unspecified lower limb: Secondary | ICD-10-CM | POA: Diagnosis not present

## 2019-04-01 DIAGNOSIS — K219 Gastro-esophageal reflux disease without esophagitis: Secondary | ICD-10-CM | POA: Diagnosis not present

## 2019-04-01 DIAGNOSIS — R69 Illness, unspecified: Secondary | ICD-10-CM | POA: Diagnosis not present

## 2019-04-01 DIAGNOSIS — M5416 Radiculopathy, lumbar region: Secondary | ICD-10-CM | POA: Diagnosis not present

## 2019-04-01 DIAGNOSIS — M4854XD Collapsed vertebra, not elsewhere classified, thoracic region, subsequent encounter for fracture with routine healing: Secondary | ICD-10-CM | POA: Diagnosis not present

## 2019-04-01 DIAGNOSIS — E039 Hypothyroidism, unspecified: Secondary | ICD-10-CM | POA: Diagnosis not present

## 2019-04-02 DIAGNOSIS — Z23 Encounter for immunization: Secondary | ICD-10-CM | POA: Diagnosis not present

## 2019-04-08 DIAGNOSIS — S22010D Wedge compression fracture of first thoracic vertebra, subsequent encounter for fracture with routine healing: Secondary | ICD-10-CM | POA: Diagnosis not present

## 2019-04-08 DIAGNOSIS — R69 Illness, unspecified: Secondary | ICD-10-CM | POA: Diagnosis not present

## 2019-04-08 DIAGNOSIS — N3281 Overactive bladder: Secondary | ICD-10-CM | POA: Diagnosis not present

## 2019-04-08 DIAGNOSIS — K580 Irritable bowel syndrome with diarrhea: Secondary | ICD-10-CM | POA: Diagnosis not present

## 2019-04-08 DIAGNOSIS — R944 Abnormal results of kidney function studies: Secondary | ICD-10-CM | POA: Diagnosis not present

## 2019-04-08 DIAGNOSIS — E782 Mixed hyperlipidemia: Secondary | ICD-10-CM | POA: Diagnosis not present

## 2019-04-08 DIAGNOSIS — R05 Cough: Secondary | ICD-10-CM | POA: Diagnosis not present

## 2019-04-08 DIAGNOSIS — E039 Hypothyroidism, unspecified: Secondary | ICD-10-CM | POA: Diagnosis not present

## 2019-04-09 ENCOUNTER — Other Ambulatory Visit (HOSPITAL_COMMUNITY): Payer: Self-pay | Admitting: Internal Medicine

## 2019-04-09 DIAGNOSIS — Z780 Asymptomatic menopausal state: Secondary | ICD-10-CM

## 2019-04-13 ENCOUNTER — Other Ambulatory Visit: Payer: Self-pay | Admitting: Orthopedic Surgery

## 2019-04-13 DIAGNOSIS — M4854XD Collapsed vertebra, not elsewhere classified, thoracic region, subsequent encounter for fracture with routine healing: Secondary | ICD-10-CM

## 2019-04-22 ENCOUNTER — Encounter (HOSPITAL_COMMUNITY): Payer: Self-pay

## 2019-04-22 ENCOUNTER — Ambulatory Visit (HOSPITAL_COMMUNITY)
Admission: RE | Admit: 2019-04-22 | Discharge: 2019-04-22 | Disposition: A | Discharge status: Home / Self Care | Payer: Medicare HMO | Source: Ambulatory Visit | Attending: Internal Medicine | Admitting: Internal Medicine

## 2019-04-22 ENCOUNTER — Other Ambulatory Visit: Payer: Self-pay

## 2019-04-22 DIAGNOSIS — Z1231 Encounter for screening mammogram for malignant neoplasm of breast: Secondary | ICD-10-CM | POA: Insufficient documentation

## 2019-04-22 DIAGNOSIS — Z780 Asymptomatic menopausal state: Secondary | ICD-10-CM | POA: Diagnosis not present

## 2019-04-22 DIAGNOSIS — M810 Age-related osteoporosis without current pathological fracture: Secondary | ICD-10-CM | POA: Diagnosis not present

## 2019-04-24 ENCOUNTER — Other Ambulatory Visit: Payer: Self-pay | Admitting: Orthopedic Surgery

## 2019-04-24 ENCOUNTER — Other Ambulatory Visit: Payer: Self-pay

## 2019-04-24 ENCOUNTER — Ambulatory Visit (INDEPENDENT_AMBULATORY_CARE_PROVIDER_SITE_OTHER): Payer: Medicare HMO | Admitting: Orthopedic Surgery

## 2019-04-24 VITALS — BP 147/46 | HR 86 | Ht 63.5 in | Wt 155.0 lb

## 2019-04-24 DIAGNOSIS — M4854XD Collapsed vertebra, not elsewhere classified, thoracic region, subsequent encounter for fracture with routine healing: Secondary | ICD-10-CM

## 2019-04-24 NOTE — Progress Notes (Signed)
Chief Complaint  Patient presents with  . Back Pain    s/p T 10 fracture since approx Jan 24 2019 improving but still has some pain if "up too long"   Julie Park is still complaining of pain around the rib cage which she has had for several years or got worse after the fracture  The fracture has essentially healed she does have some lower back pain from degenerative arthritis  Her examination today reveals tenderness in the lower back no tenderness at the fracture site there are no neurovascular deficits  We recommended the brace as needed as she is getting some support from that  As far as the peririb cage pain that is chronic and other than the aneurysm I do not see any other issues on her CAT scan  We recommended that she get that checked per her doctor when they call her in for that.

## 2019-04-24 NOTE — Telephone Encounter (Signed)
Patient was seen today but forgot to ask for refills on Tramadol 50 mgs.   Qty  28  Sig: TAKE 1 TABLET (50 MG TOTAL) BY MOUTH EVERY 6 (SIX) HOURS AS NEEDED.        [facility] [place]

## 2019-04-27 ENCOUNTER — Other Ambulatory Visit: Payer: Self-pay | Admitting: Orthopedic Surgery

## 2019-04-27 DIAGNOSIS — M4854XD Collapsed vertebra, not elsewhere classified, thoracic region, subsequent encounter for fracture with routine healing: Secondary | ICD-10-CM

## 2019-04-29 MED ORDER — TRAMADOL HCL 50 MG PO TABS: 50.0000 mg | ORAL_TABLET | Freq: Four times a day (QID) | ORAL | 0 refills | Status: DC | PRN

## 2019-05-19 DIAGNOSIS — R69 Illness, unspecified: Secondary | ICD-10-CM | POA: Diagnosis not present

## 2019-05-19 DIAGNOSIS — K580 Irritable bowel syndrome with diarrhea: Secondary | ICD-10-CM | POA: Diagnosis not present

## 2019-05-19 DIAGNOSIS — E782 Mixed hyperlipidemia: Secondary | ICD-10-CM | POA: Diagnosis not present

## 2019-05-19 DIAGNOSIS — E039 Hypothyroidism, unspecified: Secondary | ICD-10-CM | POA: Diagnosis not present

## 2019-05-19 DIAGNOSIS — N3281 Overactive bladder: Secondary | ICD-10-CM | POA: Diagnosis not present

## 2019-05-25 DIAGNOSIS — H0102A Squamous blepharitis right eye, upper and lower eyelids: Secondary | ICD-10-CM | POA: Diagnosis not present

## 2019-05-25 DIAGNOSIS — H1045 Other chronic allergic conjunctivitis: Secondary | ICD-10-CM | POA: Diagnosis not present

## 2019-05-25 DIAGNOSIS — H04123 Dry eye syndrome of bilateral lacrimal glands: Secondary | ICD-10-CM | POA: Diagnosis not present

## 2019-05-25 DIAGNOSIS — H0102B Squamous blepharitis left eye, upper and lower eyelids: Secondary | ICD-10-CM | POA: Diagnosis not present

## 2019-06-01 DIAGNOSIS — E039 Hypothyroidism, unspecified: Secondary | ICD-10-CM | POA: Diagnosis not present

## 2019-06-01 DIAGNOSIS — N3281 Overactive bladder: Secondary | ICD-10-CM | POA: Diagnosis not present

## 2019-06-01 DIAGNOSIS — E782 Mixed hyperlipidemia: Secondary | ICD-10-CM | POA: Diagnosis not present

## 2019-06-01 DIAGNOSIS — K580 Irritable bowel syndrome with diarrhea: Secondary | ICD-10-CM | POA: Diagnosis not present

## 2019-06-01 DIAGNOSIS — N182 Chronic kidney disease, stage 2 (mild): Secondary | ICD-10-CM | POA: Diagnosis not present

## 2019-06-01 DIAGNOSIS — R69 Illness, unspecified: Secondary | ICD-10-CM | POA: Diagnosis not present

## 2019-06-03 DIAGNOSIS — E039 Hypothyroidism, unspecified: Secondary | ICD-10-CM | POA: Diagnosis not present

## 2019-06-03 DIAGNOSIS — E785 Hyperlipidemia, unspecified: Secondary | ICD-10-CM | POA: Diagnosis not present

## 2019-06-03 DIAGNOSIS — K589 Irritable bowel syndrome without diarrhea: Secondary | ICD-10-CM | POA: Diagnosis not present

## 2019-06-03 DIAGNOSIS — M1990 Unspecified osteoarthritis, unspecified site: Secondary | ICD-10-CM | POA: Diagnosis not present

## 2019-06-03 DIAGNOSIS — M810 Age-related osteoporosis without current pathological fracture: Secondary | ICD-10-CM | POA: Diagnosis not present

## 2019-06-03 DIAGNOSIS — R69 Illness, unspecified: Secondary | ICD-10-CM | POA: Diagnosis not present

## 2019-06-03 DIAGNOSIS — G8929 Other chronic pain: Secondary | ICD-10-CM | POA: Diagnosis not present

## 2019-06-03 DIAGNOSIS — K5792 Diverticulitis of intestine, part unspecified, without perforation or abscess without bleeding: Secondary | ICD-10-CM | POA: Diagnosis not present

## 2019-06-03 DIAGNOSIS — R32 Unspecified urinary incontinence: Secondary | ICD-10-CM | POA: Diagnosis not present

## 2019-06-03 DIAGNOSIS — K219 Gastro-esophageal reflux disease without esophagitis: Secondary | ICD-10-CM | POA: Diagnosis not present

## 2019-07-27 ENCOUNTER — Encounter (INDEPENDENT_AMBULATORY_CARE_PROVIDER_SITE_OTHER): Payer: Medicare HMO | Admitting: Ophthalmology

## 2019-07-27 ENCOUNTER — Other Ambulatory Visit: Payer: Self-pay

## 2019-07-27 DIAGNOSIS — H43813 Vitreous degeneration, bilateral: Secondary | ICD-10-CM | POA: Diagnosis not present

## 2019-07-27 DIAGNOSIS — D3131 Benign neoplasm of right choroid: Secondary | ICD-10-CM

## 2019-08-24 ENCOUNTER — Other Ambulatory Visit: Payer: Self-pay

## 2019-08-24 ENCOUNTER — Encounter: Payer: Self-pay | Admitting: Internal Medicine

## 2019-08-24 ENCOUNTER — Ambulatory Visit (INDEPENDENT_AMBULATORY_CARE_PROVIDER_SITE_OTHER): Payer: Medicare HMO | Admitting: Internal Medicine

## 2019-08-24 VITALS — BP 122/78 | HR 82 | Temp 97.3°F | Ht 64.0 in | Wt 156.0 lb

## 2019-08-24 DIAGNOSIS — Z961 Presence of intraocular lens: Secondary | ICD-10-CM | POA: Insufficient documentation

## 2019-08-24 DIAGNOSIS — S22000A Wedge compression fracture of unspecified thoracic vertebra, initial encounter for closed fracture: Secondary | ICD-10-CM | POA: Diagnosis not present

## 2019-08-24 DIAGNOSIS — F4329 Adjustment disorder with other symptoms: Secondary | ICD-10-CM | POA: Diagnosis not present

## 2019-08-24 DIAGNOSIS — M810 Age-related osteoporosis without current pathological fracture: Secondary | ICD-10-CM

## 2019-08-24 DIAGNOSIS — D3131 Benign neoplasm of right choroid: Secondary | ICD-10-CM | POA: Insufficient documentation

## 2019-08-24 DIAGNOSIS — H359 Unspecified retinal disorder: Secondary | ICD-10-CM

## 2019-08-24 DIAGNOSIS — G603 Idiopathic progressive neuropathy: Secondary | ICD-10-CM

## 2019-08-24 DIAGNOSIS — Z23 Encounter for immunization: Secondary | ICD-10-CM | POA: Diagnosis not present

## 2019-08-24 DIAGNOSIS — M170 Bilateral primary osteoarthritis of knee: Secondary | ICD-10-CM

## 2019-08-24 DIAGNOSIS — H43811 Vitreous degeneration, right eye: Secondary | ICD-10-CM | POA: Insufficient documentation

## 2019-08-24 DIAGNOSIS — K581 Irritable bowel syndrome with constipation: Secondary | ICD-10-CM

## 2019-08-24 NOTE — Progress Notes (Signed)
Provider:  Rexene Edison. Mariea Clonts, D.O., C.M.D. Location:   [place]   Place of Service:   clinic  Previous PCP: Gayland Curry, DO Patient Care Team: Gayland Curry, DO as PCP - General (Geriatric Medicine) Monna Fam, MD as Consulting Physician (Ophthalmology)  Extended Emergency Contact Information Primary Emergency Contact: Terry,Tish Address: [address]          [place], [place] [address] [place] Phone: [phone] Work Phone: [phone] Mobile Phone: [phone] Relation: Daughter  Goals of Care: Advanced Directive information Advanced Directives 08/24/2019  Does Patient Have a Medical Advance Directive? Yes  Type of Advance Directive Living will  Does patient want to make changes to medical advance directive? No - Patient declined  Copy of [place] in Chart? -  Would patient like information on creating a medical advance directive? -  Pre-existing out of facility DNR order (yellow form or pink MOST form) -   Chief Complaint  Patient presents with  . Establish Care    new patient to establish care, back pain and stomach pain    HPI: Patient is a 86 y.o. female seen today to establish with [facility].  Her daughter, Lenna Sciara who is an NP, is here with her today.  Records have been requested from previous PCP, Dr. Emi Belfast in [place]. She had been seeing the geriatric NP there.    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  Was born in [place], [place].    Pt had been driving herself everywhere until she had her fracture of her spine.  Their goal is for her to be as independent as possible.    She has a h/o vertigo, urge incontinence, Morton's neuroma, IBS, hypothyroidism, hyperlipidemia, GERD, rib fx, diverticulosis, cataracts s/p extraction.    She fractured a vertebra in her back and has been staying with her daughter in [place], [place], since August.  She lived in [place] prior to that.  She'd been  seen twice a year in clinic.    She picked up a 24 bottle case of water at the grocery and put in her cart on 7/30.  Then put in car, then carried to house.  Got as far as back porch with it.  No problem with pain that day.  The next morning, her back started to hurt.  8/1, she could hardly get out of bed due to pain.  Her daughter took her to the ED.  They did a series of tests and she had a compression fx in the thoracic spine.  She broke ribs also in 2015.  It felt like her ribs were rebroken and radiated around.  Also down her back.  She is still having trouble with that.  She's been walking some with her daughter, Lenna Sciara. Pt is afraid of falling.  She fell once in June on a stepping stone in the yard when trying to pull the hose from the dispenser and made a mess of her right wrist.  The year before in [place], she was walking through the lobby, and she thought it was all one level, was looking ahead, and went airborne and she fell.  That got her a laceration under her right eye, contusions of face, knee swelled.  She had looked after a friend for 2 years of the 60 that they were friends.  It was a vacation after he passed away with dementia.  He'd been very possessive of her  and they did not socialize much with others.  She cleans, gardens, but no deliberate exercise.  The family was getting concerned about balance--she'd been doing to silver sneakers twice a week.  She broke her back and then covid hit.  Had just one short stint of home PT since.  Minimal exercise or strengthening since.  Her back feels better if she does exercise.  She is having a lot of difficulty climbing stairs and bending knees to go up.  It's hard coming down to alternate steps also.  Lu's bedroom/bathroom is in her daughter's basement.  Parking pad is right by the door to the basement.    Occasionally, has a pain shoots down for an instant down her right upper leg.  The lower legs when she takes her shoes off, her feet and lower  legs feel numb.  Right is still discolored from the [facility] fall.    Dr. Herbert Deaner, [facility]. Right eye had a "freckle" on the retina so going annually to Dr. Zigmund Daniel about it.  Goes to Dr. Cordelia Pen tomorrow and for an [place].  The right eye itches and stays red.  It's uncomfortable.  Vision is fine per pt.  Left sometimes will get red also.  She had blepharitis several years ago--used baby shampoo and antibiotics.  She gets CXRs due to positive TB test in the past.    Hypothyroidism:  Levothyroxine 87mcg stable for a long time.    Diverticulosis:  She had diverticulitis at one time.  On amitiza now bid, trying to remember daily metamucil, that has helped that.  She had been constipated with severe left lower abdomen.  No problems here recently.  She actually had to take sick days it bothered her that much.    High cholesterol:  Takes lipitor 10mg .  LDL 81 in 2016.  It's been checked in the last year.    Osteoporosis:  On last bone density in October of 2020 with T score of -2.8.  Takes centrum gummies MVI and viactiv chews for calcium.  Dr. Aline Brochure from orthopedics suggested an injection for osteoporosis.  The NP at primary care was going to see if she could get that.  We reviewed that now that she's had a compression fx, she may qualify for evenity injections for a year then prolia.    She also has frequent urination.  This is especially at night--has been up as much as 6-8 times.  Last night was 4 times.  Able to go back to sleep unless after 4am.  She was put on a medication years ago for her bladder (detrol), then something different that was cheaper.  Then, at her current practice, NP prescribed toviaz.  It had a negative effect on her labs, kidneys were affected.  Not on anything now.  She has urgency.  Wears pantiliners--has a little leakage especially in the day.  Says she might wait too long.  Has 1-2 cups coffee in the morning and then water after that.     Two tylenol tend to work just  as well at a tramadol which she rarely uses for severe back pain.  Hasn't taken one in probably a week.    She uses a pillbox three weeks at a time.  She tried using a different pharmacy with 90 day supplies but [facility].  She's not driving these days and is a little afraid to now.    Had shingles on left side of ribs and then left  side of face.   Had zostavax but not shingrix.  She's had a chronic cough off and on for years.  In July, Courtney had her get xrays.  She took cough syrup with codeine.  Cough is bad at night.  If she talks much, she gets the dry cough.  Takes prilosec for reflux.  Does not note indigestion anymore.  She feels like the cough used to be seasonal, but she'd had the cough for probably a year right through the seasons when she saw her NP before.  Her mother had died from TB so she always has positive PPDs. There is a moist basement in her own home.    Admits her memory is not as good as it used to be.  She has difficulty finding words.  She feels like she's in a different world when she is out and about with her children.  She looked at the gas price and said it was the time, but her daughter corrected her.  She wants to blame it on not getting out.    Her daughters feel like she's in a little bit of a funk staying in her daughter's basement.  They'd like her to have someone to talk to until she can go back to silver sneakers.  There have been so many changes in the past few years.    Lenna Sciara says she gets to be profoundly negative much of the time.    Has aortic aneurysm that size is to be rechecked in 5 years.    She's had both of her covid vaccines (jan and feb).  Past Medical History:  Diagnosis Date  . Anxiety   . Chest pain 02/08/15   ETT normal  . Diverticulosis    Diverticulitis (1982)  . Fracture of rib of right side 08/16/2013  . GERD (gastroesophageal reflux disease)   . Hyperlipidemia, mixed   . Hypothyroidism   . IBS (irritable bowel  syndrome)   . Morton's neuroma   . Right lumbar radiculopathy    Intermittent x 2 episodes in summer 2014  . Shingles 1967 and 2010  . Ulcer   . Urine incontinence   . Vertigo    Past Surgical History:  Procedure Laterality Date  . CARDIOVASCULAR STRESS TEST  02/08/15   ETT normal  . CATARACT EXTRACTION  2012   Dr. Herbert Deaner  . COLONOSCOPY  2005   Dr. Arsenio Loader at [facility] (normal per pt report)  . DEXA  11/03/13   Heel T score -0.8.  Marland Kitchen [place]   right foot  . TONSILLECTOMY    . TONSILLECTOMY AND ADENOIDECTOMY  1948   Dr. [place]  . TUBAL LIGATION  1974  . WISDOM TOOTH EXTRACTION      Social History   Socioeconomic History  . Marital status: Married    Spouse name: Not on file  . Number of children: Not on file  . Years of education: Not on file  . Highest education level: Not on file  Occupational History  . Occupation: Retired  Tobacco Use  . Smoking status: Former Smoker    Packs/day: 1.00    Years: 8.00    Pack years: 8.00    Types: Cigarettes    Start date: 06/26/1983  . Smokeless tobacco: Never Used  Substance and Sexual Activity  . Alcohol use: No  . Drug use: No  . Sexual activity: Not Currently  Other Topics Concern  . Not on file  Social History Narrative   Divorced, 2 children.   Lives in [place].   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   Tobacco: small amount, distant past.  Alcohol: none.           Social History      Diet? n/a      Do you drink/eat things with caffeine? coffee      Marital status?     Divorced (1984)                              What year were you married? 1958      Do you live in a house, apartment, assisted living, condo, trailer, etc.? house      Is it one or more stories?  yes      How many persons live in your home? Living with daughter and husband at the present      Do you have any pets in your home?  (please list) no       Highest level of education completed? 16 years      Current or past profession: teacher grades 1 and 2, education coordinator [employer]       Do you exercise?                   yes                   Type & how often? Walk 1-3 times per week      Advanced Directives      Do you have a living will? yes      Do you have a DNR form?                                  If not, do you want to discuss one?      Do you have signed POA/HPOA for forms? yes      Functional Status      Do you have difficulty bathing or dressing yourself?      Do you have difficulty preparing food or eating?       Do you have difficulty managing your medications?      Do you have difficulty managing your finances?      Do you have difficulty affording your medications?      Social Determinants of Health   Financial Resource Strain:   . Difficulty of Paying Living Expenses: Not on file  Food Insecurity:   . Worried About Charity fundraiser in the Last Year: Not on file  . Ran Out of Food in the Last Year: Not on file  Transportation Needs:   . Lack of Transportation (Medical): Not on file  . Lack of Transportation (Non-Medical): Not on file  Physical Activity:   . Days of Exercise per Week: Not on file  . Minutes of Exercise per Session: Not on file  Stress:   . Feeling of Stress : Not on file  Social Connections:   . Frequency of Communication with Friends and Family: Not on file  . Frequency of Social Gatherings with Friends and Family: Not on file  . Attends Religious Services: Not on file  . Active Member of Clubs or Organizations: Not on file  . Attends Archivist Meetings:  Not on file  . Marital Status: Not on file    reports that she has quit smoking. Her smoking use included cigarettes. She started smoking about 36 years ago. She has a 8.00 pack-year smoking history. She has never used smokeless tobacco. She reports that she does not drink alcohol or use  drugs.  Functional Status Survey:    Family History  Problem Relation Age of Onset  . Tuberculosis Mother   . Pancreatic cancer Father   . Asthma Daughter   . Heart attack Paternal Grandfather     Health Maintenance  Topic Date Due  . PNA vac Low Risk Adult (2 of 2 - PPSV23) 03/20/2014  . MAMMOGRAM  04/21/2020  . TETANUS/TDAP  12/19/2028  . INFLUENZA VACCINE  Completed  . DEXA SCAN  Completed    No Known Allergies  Outpatient Encounter Medications as of 08/24/2019  Medication Sig  . acetaminophen (TYLENOL) 500 MG tablet Take 1,000 mg by mouth every 6 (six) hours as needed for headache.   Marland Kitchen aspirin 81 MG tablet Take 81 mg by mouth every morning.   Marland Kitchen atorvastatin (LIPITOR) 10 MG tablet Take 10 mg by mouth daily.  Marland Kitchen levothyroxine (SYNTHROID, LEVOTHROID) 88 MCG tablet Take 1 tablet (88 mcg total) by mouth daily.  Marland Kitchen LORazepam (ATIVAN) 1 MG tablet Take 1 tablet (1 mg total) by mouth every 8 (eight) hours.  Marland Kitchen lubiprostone (AMITIZA) 8 MCG capsule Take 8 mcg by mouth 2 (two) times daily with a meal.  . Magnesium 250 MG TABS Take 2 tablets by mouth daily.  Marland Kitchen omeprazole (PRILOSEC) 40 MG capsule Take by mouth.  . psyllium (HYDROCIL/METAMUCIL) 95 % PACK Take 1 packet by mouth daily.  . traMADol (ULTRAM) 50 MG tablet Take 1 tablet (50 mg total) by mouth every 6 (six) hours as needed.   No facility-administered encounter medications on file as of 08/24/2019.    Review of Systems  Constitutional: Negative for chills and fever.  HENT: Positive for hearing loss. Negative for congestion and sore throat.        Dentures, dry mouth  Eyes:       Dry eyes; macular degeneration, itching  Respiratory: Negative for cough and shortness of breath.   Cardiovascular: Negative for chest pain, palpitations and leg swelling.  Gastrointestinal: Positive for constipation. Negative for abdominal pain, blood in stool and melena.       H/o diverticulosis, constipation on amitiza  Genitourinary: Positive for  frequency and urgency.  Musculoskeletal: Positive for back pain and myalgias. Negative for falls.       Osteoporosis  Neurological: Negative for dizziness and loss of consciousness.  Psychiatric/Behavioral: Positive for memory loss. Negative for depression. The patient is not nervous/anxious and does not have insomnia.        "profoundly negative" per her daughter, Lenna Sciara    Vitals:   08/24/19 1343  BP: 122/78  Pulse: 82  Temp: (!) 97.3 F (36.3 C)  TempSrc: Temporal  SpO2: 97%  Weight: 156 lb (70.8 kg)  Height: 5\' 4"  (1.626 m)   Body mass index is 26.78 kg/m. Physical Exam Vitals reviewed.  Constitutional:      General: She is not in acute distress.    Appearance: Normal appearance. She is not toxic-appearing.  HENT:     Head: Normocephalic and atraumatic.     Right Ear: Tympanic membrane, ear canal and external ear normal. There is no impacted cerumen.     Left Ear: Tympanic membrane, ear canal and external  ear normal. There is no impacted cerumen.     Ears:     Comments: HOH    Nose: Nose normal.     Mouth/Throat:     Mouth: Mucous membranes are dry.     Pharynx: Oropharynx is clear.  Eyes:     Extraocular Movements: Extraocular movements intact.     Conjunctiva/sclera: Conjunctivae normal.     Pupils: Pupils are equal, round, and reactive to light.     Comments: glasses  Cardiovascular:     Rate and Rhythm: Normal rate and regular rhythm.     Pulses: Normal pulses.     Heart sounds: Normal heart sounds.  Pulmonary:     Effort: Pulmonary effort is normal.     Breath sounds: Normal breath sounds. No wheezing, rhonchi or rales.  Abdominal:     General: Bowel sounds are normal. There is no distension.     Palpations: Abdomen is soft.     Tenderness: There is no abdominal tenderness. There is no guarding or rebound.  Musculoskeletal:        General: Normal range of motion.     Cervical back: Neck supple.     Right lower leg: No edema.     Left lower leg: No  edema.  Skin:    General: Skin is warm and dry.     Capillary Refill: Capillary refill takes less than 2 seconds.  Neurological:     General: No focal deficit present.     Mental Status: She is alert and oriented to person, place, and time.     Cranial Nerves: No cranial nerve deficit.     Sensory: Sensory deficit present.     Motor: No weakness.     Gait: Gait normal.  Psychiatric:        Mood and Affect: Mood normal.        Behavior: Behavior normal.     Labs reviewed: Basic Metabolic Panel: Recent Labs    01/24/19 1425  NA 133*  K 4.5  CL 102  CO2 23  GLUCOSE 104*  BUN 15  CREATININE 0.74  CALCIUM 8.9   Liver Function Tests: Recent Labs    01/24/19 1425  AST 19  ALT 17  ALKPHOS 91  BILITOT 0.6  PROT 7.3  ALBUMIN 3.8   Recent Labs    01/24/19 1425  LIPASE 50   No results for input(s): AMMONIA in the last 8760 hours. CBC: Recent Labs    01/24/19 1425  WBC 7.7  NEUTROABS 5.3  HGB 13.3  HCT 41.2  MCV 88.6  PLT 247   Cardiac Enzymes: No results for input(s): CKTOTAL, CKMB, CKMBINDEX, TROPONINI in the last 8760 hours. BNP: Invalid input(s): POCBNP No results found for: HGBA1C Lab Results  Component Value Date   TSH 1.84 11/04/2014   No results found for: VITAMINB12 No results found for: FOLATE No results found for: IRON, TIBC, FERRITIN  Imaging and Procedures noted on new patient packet: Reviewed in epic  Assessment/Plan 1. Senile osteoporosis -with compression fx and kyphosis  -cont ca with D and additional D3, weightbearing exercise, needs some increased exercises to help with posture and strengthening to support her spine -will get authorization for evenity injections due to compression fx and osteoporosis  2. Compression fracture of body of thoracic vertebra (HCC) -followed by Dr. Aline Brochure in [place] -see #1  3. Need for shingles vaccine -recommended they arrange for her shingrix vaccines at her pharmacy now that she's been  done with  the covid series  4. Idiopathic progressive neuropathy - etiology not clear, check routine labs that might explain - Ambulatory referral to [place] GFR - CBC with Differential/Platelet - Hemoglobin A1c - TSH - Vitamin B12  5. Adjustment disorder with other symptom -given contacts for psychiatry offices locally that offer counseling, as well  6. Irritable bowel syndrome with constipation - cont amitiza and high fiber diet to prevent constipation and diverticulitis flares - COMPLETE METABOLIC PANEL WITH GFR  7. Retinal disease, right -keep scheduled appt with Dr. Cordelia Pen for further eval   8. Primary osteoarthritis of both knees - Ambulatory referral to [place] -has tramadol for severe pain, but uses tylenol more often, encouraged exercise for joint lubrication  Labs/tests ordered:   Lab Orders     COMPLETE METABOLIC PANEL WITH GFR     CBC with Differential/Platelet     Hemoglobin A1c     TSH     Vitamin B12  10/26/2019 for MMSE and f/u on balance and bones  90 mins spent on new patient visit  Aislinn Feliz L. Desmund Elman, D.O. [facility] [address], [place] [address] Cell Phone (Mon-Fri 8am-5pm):  [phone] On Call:  [phone] & follow prompts after 5pm & weekends Office Phone:  [phone] Office Fax:  [phone]

## 2019-08-24 NOTE — Patient Instructions (Addendum)
[employer] [facility] for transportation.    Please contact one of the attached offices for a psychologist to talk with.  I ordered home health therapy.    [facility].    We will set you up with evenity and then prolia for osteoporosis management.  We will call you about that.

## 2019-08-25 LAB — COMPLETE METABOLIC PANEL WITH GFR
AG Ratio: 1.5 (calc) (ref 1.0–2.5)
ALT: 17 U/L (ref 6–29)
AST: 19 U/L (ref 10–35)
Albumin: 4.3 g/dL (ref 3.6–5.1)
Alkaline phosphatase (APISO): 87 U/L (ref 37–153)
BUN: 13 mg/dL (ref 7–25)
CO2: 25 mmol/L (ref 20–32)
Calcium: 9.7 mg/dL (ref 8.6–10.4)
Chloride: 97 mmol/L — ABNORMAL LOW (ref 98–110)
Creat: 0.78 mg/dL (ref 0.60–0.88)
GFR, Est African American: 81 mL/min/{1.73_m2} (ref >=60)
GFR, Est Non African American: 70 mL/min/{1.73_m2} (ref >=60)
Globulin: 2.8 g/dL (calc) (ref 1.9–3.7)
Glucose, Bld: 86 mg/dL (ref 65–99)
Potassium: 4.6 mmol/L (ref 3.5–5.3)
Sodium: 132 mmol/L — ABNORMAL LOW (ref 135–146)
Total Bilirubin: 0.5 mg/dL (ref 0.2–1.2)
Total Protein: 7.1 g/dL (ref 6.1–8.1)

## 2019-08-25 LAB — CBC WITH DIFFERENTIAL/PLATELET
Absolute Monocytes: 581 cells/uL (ref 200–950)
Basophils Absolute: 40 cells/uL (ref 0–200)
Basophils Relative: 0.6 %
Eosinophils Absolute: 231 cells/uL (ref 15–500)
Eosinophils Relative: 3.5 %
HCT: 41.8 % (ref 35.0–45.0)
Hemoglobin: 13.8 g/dL (ref 11.7–15.5)
Lymphs Abs: 2211 cells/uL (ref 850–3900)
MCH: 28.9 pg (ref 27.0–33.0)
MCHC: 33 g/dL (ref 32.0–36.0)
MCV: 87.6 fL (ref 80.0–100.0)
MPV: 10.7 fL (ref 7.5–12.5)
Monocytes Relative: 8.8 %
Neutro Abs: 3538 cells/uL (ref 1500–7800)
Neutrophils Relative %: 53.6 %
Platelets: 252 10*3/uL (ref 140–400)
RBC: 4.77 10*6/uL (ref 3.80–5.10)
RDW: 12.8 % (ref 11.0–15.0)
Total Lymphocyte: 33.5 %
WBC: 6.6 10*3/uL (ref 3.8–10.8)

## 2019-08-25 LAB — TSH: TSH: 2.41 mIU/L (ref 0.40–4.50)

## 2019-08-25 LAB — HEMOGLOBIN A1C
Hgb A1c MFr Bld: 5.5 % of total Hgb (ref <5.7)
Mean Plasma Glucose: 111 (calc)
eAG (mmol/L): 6.2 (calc)

## 2019-08-25 LAB — VITAMIN B12: Vitamin B-12: 459 pg/mL (ref 200–1100)

## 2019-08-25 NOTE — Progress Notes (Signed)
Kidneys are normal. Sugar was normal including average over three months. Sodium is just mildly low--when I get her records, I'll be able to tell if this is new. Thyroid is ok. B12 is ok.

## 2019-09-07 ENCOUNTER — Telehealth: Payer: Self-pay

## 2019-09-07 ENCOUNTER — Telehealth: Payer: Self-pay | Admitting: Internal Medicine

## 2019-09-07 NOTE — Telephone Encounter (Signed)
Estill Bamberg with [facility] called to ck on ins verification (that daughter Lenna Sciara requested) for Evenity injection.  I do not have a request/info to submit to [employer] for [employer]. Once that request is ready, I'll get that auth over to [employer] to process.  Thanks, Lattie Haw

## 2019-09-07 NOTE — Telephone Encounter (Signed)
Estill Bamberg calling from [facility] states that patient needs orders for nursing once a week for 2 weeks. Patient last office visit was 08/24/2019. I confirmed with verbal order. Call routed to provider.

## 2019-09-07 NOTE — Telephone Encounter (Signed)
Thanks, I have it & been submitted Lattie Haw

## 2019-09-07 NOTE — Telephone Encounter (Signed)
It was in your paper inbasket in your office (I did it Friday).   I put it on your keyboard :)

## 2019-09-14 ENCOUNTER — Telehealth: Payer: Self-pay | Admitting: Internal Medicine

## 2019-09-17 ENCOUNTER — Ambulatory Visit: Payer: Medicare HMO

## 2019-09-17 ENCOUNTER — Other Ambulatory Visit: Payer: Self-pay

## 2019-09-17 DIAGNOSIS — M810 Age-related osteoporosis without current pathological fracture: Secondary | ICD-10-CM | POA: Diagnosis not present

## 2019-09-17 MED ORDER — ROMOSOZUMAB-AQQG 105 MG/1.17ML ~~LOC~~ SOSY
210.0000 mg | PREFILLED_SYRINGE | Freq: Once | SUBCUTANEOUS | Status: AC
  Administered 2019-09-17: 210 mg via SUBCUTANEOUS

## 2019-09-28 ENCOUNTER — Encounter: Payer: Self-pay | Admitting: Internal Medicine

## 2019-10-12 ENCOUNTER — Encounter: Payer: Self-pay | Admitting: Internal Medicine

## 2019-10-19 ENCOUNTER — Ambulatory Visit: Payer: Medicare HMO

## 2019-10-26 ENCOUNTER — Ambulatory Visit: Payer: Medicare HMO | Admitting: Internal Medicine

## 2019-10-29 ENCOUNTER — Other Ambulatory Visit: Payer: Self-pay

## 2019-10-29 ENCOUNTER — Encounter: Payer: Self-pay | Admitting: Internal Medicine

## 2019-10-29 ENCOUNTER — Ambulatory Visit (INDEPENDENT_AMBULATORY_CARE_PROVIDER_SITE_OTHER): Payer: Medicare HMO | Admitting: Internal Medicine

## 2019-10-29 VITALS — BP 142/82 | HR 83 | Temp 98.7°F | Ht 64.0 in | Wt 160.0 lb

## 2019-10-29 DIAGNOSIS — R053 Chronic cough: Secondary | ICD-10-CM

## 2019-10-29 DIAGNOSIS — F4329 Adjustment disorder with other symptoms: Secondary | ICD-10-CM

## 2019-10-29 DIAGNOSIS — M810 Age-related osteoporosis without current pathological fracture: Secondary | ICD-10-CM

## 2019-10-29 DIAGNOSIS — R05 Cough: Secondary | ICD-10-CM | POA: Diagnosis not present

## 2019-10-29 DIAGNOSIS — S22000A Wedge compression fracture of unspecified thoracic vertebra, initial encounter for closed fracture: Secondary | ICD-10-CM | POA: Diagnosis not present

## 2019-10-29 DIAGNOSIS — N3281 Overactive bladder: Secondary | ICD-10-CM

## 2019-10-29 MED ORDER — ROMOSOZUMAB-AQQG 105 MG/1.17ML ~~LOC~~ SOSY
105.0000 mg | PREFILLED_SYRINGE | Freq: Once | SUBCUTANEOUS | Status: AC
  Administered 2019-10-29: 105 mg via SUBCUTANEOUS

## 2019-10-29 MED ORDER — HYDROCOD POLST-CPM POLST ER 10-8 MG/5ML PO SUER: 5.0000 mL | Freq: Every evening | ORAL | 0 refills | Status: DC | PRN

## 2019-10-29 NOTE — Progress Notes (Signed)
Location:  [facility] Provider:  Marcina Kinnison L. Mariea Clonts, D.O., C.M.D.  Goals of Care:  Advanced Directives 08/24/2019  Does Patient Have a Medical Advance Directive? Yes  Type of Advance Directive Living will  Does patient want to make changes to medical advance directive? No - Patient declined  Copy of [place] in Chart? -  Would patient like information on creating a medical advance directive? -  Pre-existing out of facility DNR order (yellow form or pink MOST form) -   Chief Complaint  Patient presents with  . Medical Management of Chronic Issues    2 month  folow up / evenity injection    HPI: Patient is a 86 y.o. female seen today for medical management of chronic diseases.    Saw Dr. Cordelia Pen for her choroidal nevus.  She is to f/u with Dr. Zigmund Daniel.    She has been through a lot in the past several months.   Says her mood is good unless she gets to thinking about her son coming in to take everything from her.    Evenity injection for osteoporosis--3/25 was first and second is today.  She has a little scab that comes and goes on her chin.  She picks at it and it falls off.  Then it will bleed a little, then it recurs in another day or two and it it's vicious cycle.    She had physical therapy after her compression fx which she completed last week.  She notes an improvement in her bladder control since moving more.  She can go an hour b/w trips now.   She took detrol at one time and it dried out her mouth.  BP up a little today so would not try myrbetriq right now.  BP has been running high with therapy.    She does not want to take anything more for her anxiety and stress.  She's had a persistent cough that gets worse in fall and early summer.    Past Medical History:  Diagnosis Date  . Anxiety   . Chest pain 02/08/15   ETT normal  . Diverticulosis    Diverticulitis (1982)  . Fracture of rib of right side 08/16/2013  . GERD (gastroesophageal reflux disease)    . Hyperlipidemia, mixed   . Hypothyroidism   . IBS (irritable bowel syndrome)   . Morton's neuroma   . Right lumbar radiculopathy    Intermittent x 2 episodes in summer 2014  . Shingles 1967 and 2010  . Ulcer   . Urine incontinence   . Vertigo     Past Surgical History:  Procedure Laterality Date  . CARDIOVASCULAR STRESS TEST  02/08/15   ETT normal  . CATARACT EXTRACTION  2012   Dr. Herbert Deaner  . COLONOSCOPY  2005   Dr. Arsenio Loader at [facility] (normal per pt report)  . DEXA  11/03/13   Heel T score -0.8.  Marland Kitchen [place]   right foot  . TONSILLECTOMY    . TONSILLECTOMY AND ADENOIDECTOMY  1948   Dr. [place]  . TUBAL LIGATION  1974  . WISDOM TOOTH EXTRACTION      No Known Allergies  Outpatient Encounter Medications as of 10/29/2019  Medication Sig  . acetaminophen (TYLENOL) 500 MG tablet Take 1,000 mg by mouth every 6 (six) hours as needed for headache.   Marland Kitchen aspirin 81 MG tablet Take 81 mg by mouth every morning.   Marland Kitchen atorvastatin (LIPITOR)  10 MG tablet Take 10 mg by mouth daily.  Marland Kitchen levothyroxine (SYNTHROID, LEVOTHROID) 88 MCG tablet Take 1 tablet (88 mcg total) by mouth daily.  Marland Kitchen LORazepam (ATIVAN) 1 MG tablet Take 1 tablet (1 mg total) by mouth every 8 (eight) hours.  . Magnesium 250 MG TABS Take 2 tablets by mouth daily.  Marland Kitchen omeprazole (PRILOSEC) 40 MG capsule Take by mouth.  . psyllium (HYDROCIL/METAMUCIL) 95 % PACK Take 1 packet by mouth daily.  . Romosozumab-aqqg (EVENITY) 105 MG/1.17ML SOSY injection Inject 210 mg into the skin every 30 (thirty) days.  . [DISCONTINUED] lubiprostone (AMITIZA) 8 MCG capsule Take 8 mcg by mouth 2 (two) times daily with a meal.  . [DISCONTINUED] traMADol (ULTRAM) 50 MG tablet Take 1 tablet (50 mg total) by mouth every 6 (six) hours as needed.   No facility-administered encounter medications on file as of 10/29/2019.    Review of Systems:  Review of Systems  Constitutional: Negative for chills, fever and  malaise/fatigue.  Eyes: Negative for blurred vision.  Respiratory: Positive for cough. Negative for shortness of breath.   Cardiovascular: Negative for chest pain, palpitations and leg swelling.  Gastrointestinal: Negative for abdominal pain, blood in stool, constipation and melena.  Genitourinary: Negative for dysuria.  Musculoskeletal: Negative for falls.  Skin: Negative for itching and rash.  Neurological: Negative for loss of consciousness.  Endo/Heme/Allergies: Bruises/bleeds easily.  Psychiatric/Behavioral: Positive for depression. The patient is nervous/anxious.        Stress    Health Maintenance  Topic Date Due  . PNA vac Low Risk Adult (2 of 2 - PPSV23) 03/21/2020 (Originally 03/20/2014)  . INFLUENZA VACCINE  01/24/2020  . MAMMOGRAM  04/21/2020  . TETANUS/TDAP  12/19/2028  . DEXA SCAN  Completed  . COVID-19 Vaccine  Completed    Physical Exam: Vitals:   10/29/19 1418  BP: (!) 142/82  Pulse: 83  Temp: 98.7 F (37.1 C)  TempSrc: Temporal  SpO2: 97%  Weight: 160 lb (72.6 kg)  Height: 5\' 4"  (1.626 m)   Body mass index is 27.46 kg/m. Physical Exam Vitals reviewed.  Constitutional:      General: She is not in acute distress.    Appearance: She is not ill-appearing or toxic-appearing.  HENT:     Head: Normocephalic and atraumatic.  Cardiovascular:     Rate and Rhythm: Normal rate and regular rhythm.     Pulses: Normal pulses.     Heart sounds: Normal heart sounds.  Pulmonary:     Effort: Pulmonary effort is normal.     Breath sounds: Normal breath sounds. No wheezing, rhonchi or rales.  Abdominal:     General: Bowel sounds are normal.  Musculoskeletal:        General: Normal range of motion.     Right lower leg: No edema.     Left lower leg: No edema.  Skin:    General: Skin is warm and dry.  Neurological:     General: No focal deficit present.     Mental Status: She is alert and oriented to person, place, and time.  Psychiatric:        Mood and  Affect: Mood normal.        Behavior: Behavior normal.     Labs reviewed: Basic Metabolic Panel: Recent Labs    01/24/19 1425 08/24/19 1538  NA 133* 132*  K 4.5 4.6  CL 102 97*  CO2 23 25  GLUCOSE 104* 86  BUN 15 13  CREATININE 0.74 0.78  CALCIUM 8.9 9.7  TSH  --  2.41   Liver Function Tests: Recent Labs    01/24/19 1425 08/24/19 1538  AST 19 19  ALT 17 17  ALKPHOS 91  --   BILITOT 0.6 0.5  PROT 7.3 7.1  ALBUMIN 3.8  --    Recent Labs    01/24/19 1425  LIPASE 50   No results for input(s): AMMONIA in the last 8760 hours. CBC: Recent Labs    01/24/19 1425 08/24/19 1538  WBC 7.7 6.6  NEUTROABS 5.3 3,538  HGB 13.3 13.8  HCT 41.2 41.8  MCV 88.6 87.6  PLT 247 252   Lipid Panel: No results for input(s): CHOL, HDL, LDLCALC, TRIG, CHOLHDL, LDLDIRECT in the last 8760 hours. Lab Results  Component Value Date   HGBA1C 5.5 08/24/2019    Procedures since last visit: No results found.  Assessment/Plan 1. Chronic cough -she has historically used some cough syrup with codeine when her cough is particularly bothersome which is seasonal and requests this to be continued--risks reviewed - chlorpheniramine-HYDROcodone (TUSSIONEX PENNKINETIC ER) 10-8 MG/5ML SUER; Take 5 mLs by mouth at bedtime as needed for cough.  Dispense: 140 mL; Refill: 0  2. Senile osteoporosis -cont vitamin D, weightbearing exercise and evenity, last calcium wnl - Romosozumab-aqqg (EVENITY) 105 MG/1.17ML injection 105 mg  3. Compression fracture of body of thoracic vertebra (HCC) -cont osteoporosis tx, tylenol for pain  4. Adjustment disorder with other symptom -cont lorazepam for anxiety related to current situation with family since her long-term partner's death  90. Overactive bladder -counseled about avoiding hydration in evening to avoid getting up at night, considering myrbetriq for this if affordable for her--samples given  Labs/tests ordered:   Lab Orders  No laboratory test(s)  ordered today    Next appt:  03/10/2020    Johnathan Tortorelli L. Zailyn Rowser, D.O. [facility] [address], [place] [address] Cell Phone (Mon-Fri 8am-5pm):  [phone] On Call:  [phone] & follow prompts after 5pm & weekends Office Phone:  [phone] Office Fax:  [phone]

## 2019-11-09 ENCOUNTER — Telehealth: Payer: Self-pay

## 2019-11-09 DIAGNOSIS — R238 Other skin changes: Secondary | ICD-10-CM

## 2019-11-09 NOTE — Telephone Encounter (Signed)
Patient's daughter states that they had discussed at her last appointment about getting a Dermatology referral for lesion on her chin.  I did not see a referral mentioned in your last note, and there is no referral in Epic.

## 2019-11-30 ENCOUNTER — Ambulatory Visit: Payer: Medicare HMO

## 2019-12-01 ENCOUNTER — Other Ambulatory Visit: Payer: Self-pay

## 2019-12-01 ENCOUNTER — Encounter: Payer: Self-pay | Admitting: Family

## 2019-12-01 ENCOUNTER — Ambulatory Visit: Payer: Medicare HMO | Admitting: Family

## 2019-12-01 VITALS — BP 134/84 | HR 75 | Temp 96.9°F | Ht 64.0 in | Wt 160.6 lb

## 2019-12-01 DIAGNOSIS — Y92009 Unspecified place in unspecified non-institutional (private) residence as the place of occurrence of the external cause: Secondary | ICD-10-CM

## 2019-12-01 DIAGNOSIS — R600 Localized edema: Secondary | ICD-10-CM | POA: Diagnosis not present

## 2019-12-01 DIAGNOSIS — T148XXA Other injury of unspecified body region, initial encounter: Secondary | ICD-10-CM | POA: Diagnosis not present

## 2019-12-01 DIAGNOSIS — M810 Age-related osteoporosis without current pathological fracture: Secondary | ICD-10-CM | POA: Diagnosis not present

## 2019-12-01 DIAGNOSIS — S81812A Laceration without foreign body, left lower leg, initial encounter: Secondary | ICD-10-CM

## 2019-12-01 DIAGNOSIS — W19XXXA Unspecified fall, initial encounter: Secondary | ICD-10-CM

## 2019-12-01 MED ORDER — ROMOSOZUMAB-AQQG 105 MG/1.17ML ~~LOC~~ SOSY: 210.0000 mg | PREFILLED_SYRINGE | Freq: Once | SUBCUTANEOUS | Status: DC

## 2019-12-01 MED ORDER — ROMOSOZUMAB-AQQG 105 MG/1.17ML ~~LOC~~ SOSY
210.0000 mg | PREFILLED_SYRINGE | Freq: Once | SUBCUTANEOUS | Status: AC
  Administered 2019-12-01: 210 mg via SUBCUTANEOUS

## 2019-12-01 NOTE — Patient Instructions (Addendum)
-   cleanse left lower leg skin tear with saline,pat dry,apply small amount of triple antibiotic ointment and cover with non-adhesive dressing and secure with paper tape.change dressing daily until healed.Notify provider's office for any signs of infections e.g redness,swelling,odor or drainage.  - wear knee high compression stockings on both legs on in the morning and take them off at bedtime for leg swelling

## 2019-12-06 NOTE — Progress Notes (Signed)
Provider: Annabeth Tortora FNP-C  Gayland Curry, DO  Patient Care Team: Gayland Curry, DO as PCP - General (Geriatric Medicine) Monna Fam, MD as Consulting Physician (Ophthalmology) Gerarda Fraction, MD as Referring Physician (Ophthalmology) Carole Civil, MD as Consulting Physician (Orthopedic Surgery)  Extended Emergency Contact Information Primary Emergency Contact: Terry,Tish Address: [address]          [place], [place] [address] [place] Phone: [phone] Work Phone: [phone] Mobile Phone: [phone] Relation: Daughter  Code Status: Full Code  Goals of care: Advanced Directive information Advanced Directives 12/01/2019  Does Patient Have a Medical Advance Directive? Yes  Type of Paramedic of [place];Living will  Does patient want to make changes to medical advance directive? No - Patient declined  Copy of [place] in Chart? Yes - validated most recent copy scanned in chart (See row information)  Would patient like information on creating a medical advance directive? -  Pre-existing out of facility DNR order (yellow form or pink MOST form) -     Chief Complaint  Patient presents with  . Acute Visit    Fall with back, knee, elbow pain and cut on shin.     HPI:  Pt is a 86 y.o. female seen today for an acute visit for follow fall.she complains of back,knee ,elbow pain and a cut on shin area.she tripped over her computer cord.states foot got caught by computer cord trying to get up following on her right side.states might have hit the desk on her left knee and upper chest.Has bruised area.she denies hitting head.Also denies any dizziness,headcahe,fever or chills.  Brought in her advanced directives copy to be scanned.  Past Medical History:  Diagnosis Date  . Anxiety   . Chest pain 02/08/15   ETT normal  . Diverticulosis    Diverticulitis (1982)  . Fracture of rib of right side  08/16/2013  . GERD (gastroesophageal reflux disease)   . Hyperlipidemia, mixed   . Hypothyroidism   . IBS (irritable bowel syndrome)   . Morton's neuroma   . Right lumbar radiculopathy    Intermittent x 2 episodes in summer 2014  . Shingles 1967 and 2010  . Ulcer   . Urine incontinence   . Vertigo    Past Surgical History:  Procedure Laterality Date  . CARDIOVASCULAR STRESS TEST  02/08/15   ETT normal  . CATARACT EXTRACTION  2012   Dr. Herbert Deaner  . COLONOSCOPY  2005   Dr. Arsenio Loader at [facility] (normal per pt report)  . DEXA  11/03/13   Heel T score -0.8.  Marland Kitchen [place]   right foot  . TONSILLECTOMY    . TONSILLECTOMY AND ADENOIDECTOMY  1948   Dr. Mayo  . TUBAL LIGATION  1974  . WISDOM TOOTH EXTRACTION      No Known Allergies  Outpatient Encounter Medications as of 12/01/2019  Medication Sig  . acetaminophen (TYLENOL) 500 MG tablet Take 1,000 mg by mouth every 6 (six) hours as needed for headache.   Marland Kitchen aspirin 81 MG tablet Take 81 mg by mouth every morning.   Marland Kitchen atorvastatin (LIPITOR) 10 MG tablet Take 10 mg by mouth daily.  . chlorpheniramine-HYDROcodone (TUSSIONEX PENNKINETIC ER) 10-8 MG/5ML SUER Take 5 mLs by mouth at bedtime as needed for cough.  . levothyroxine (SYNTHROID, LEVOTHROID) 88 MCG tablet Take 1 tablet (88 mcg total) by mouth daily.  Marland Kitchen LORazepam (ATIVAN) 1  MG tablet Take 1 tablet (1 mg total) by mouth every 8 (eight) hours.  . Magnesium 250 MG TABS Take 1 tablet by mouth daily.   Marland Kitchen omeprazole (PRILOSEC) 40 MG capsule Take 40 mg by mouth daily.   . psyllium (HYDROCIL/METAMUCIL) 95 % PACK Take 1 packet by mouth daily.  . [DISCONTINUED] Romosozumab-aqqg (EVENITY) 105 MG/1.17ML SOSY injection Inject 210 mg into the skin every 30 (thirty) days.  . [EXPIRED] Romosozumab-aqqg (EVENITY) 105 MG/1.17ML injection 210 mg    No facility-administered encounter medications on file as of 12/01/2019.    Review of Systems  Constitutional: Negative  for chills, fever and malaise/fatigue.  HENT: Negative for congestion, sinus pain and sore throat.   Respiratory: Negative for cough, shortness of breath and wheezing.   Cardiovascular: Negative for chest pain, palpitations and leg swelling.  Gastrointestinal: Negative for abdominal pain, constipation, diarrhea, nausea and vomiting.  Genitourinary: Negative for dysuria, flank pain, frequency and urgency.  Musculoskeletal: Negative for joint pain and myalgias.  Neurological: Negative for dizziness, speech change, focal weakness and weakness.  Psychiatric/Behavioral: Negative for depression and hallucinations. The patient is not nervous/anxious and does not have insomnia.       Immunization History  Administered Date(s) Administered  . Influenza Split 02/24/2012  . Influenza, High Dose Seasonal PF 04/29/2018, 04/24/2019  . Influenza,inj,Quad PF,6+ Mos 02/23/2013  . Influenza-Unspecified 03/08/2015, 03/26/2017  . PFIZER SARS-COV-2 Vaccination 07/15/2019, 08/14/2019  . Pneumococcal Conjugate-13 03/25/1996, 03/20/2013  . Pneumococcal Polysaccharide-23 03/20/2013  . Td 12/20/2018  . Tdap 09/17/2013  . Zoster 08/23/2012   Pertinent  Health Maintenance Due  Topic Date Due  . PNA vac Low Risk Adult (2 of 2 - PPSV23) 03/21/2020 (Originally 03/20/2014)  . INFLUENZA VACCINE  01/24/2020  . MAMMOGRAM  04/21/2020  . DEXA SCAN  Completed   Fall Risk  12/01/2019 10/29/2019 08/24/2019 07/15/2018 04/08/2015  Falls in the past year? 1 0 0 1 No  Number falls in past yr: 0 0 0 0 -  Comment 1 - - - -  Injury with Fall? 1 0 0 1 -  Comment Scrapes, bruises - - trauma to the right eye, contusion, stitches -    Vitals:   12/01/19 1305  BP: 134/84  Pulse: 75  Temp: (!) 96.9 F (36.1 C)  TempSrc: Temporal  SpO2: 98%  Weight: 160 lb 9.6 oz (72.8 kg)  Height: 5\' 4"  (1.626 m)   Body mass index is 27.57 kg/m.  Physical Exam Vitals reviewed.  Constitutional:      General: She is not in acute distress.     Appearance: She is not ill-appearing.  HENT:     Head: Normocephalic.     Right Ear: Tympanic membrane, ear canal and external ear normal. There is no impacted cerumen.     Left Ear: Tympanic membrane, ear canal and external ear normal. There is no impacted cerumen.     Nose: Nose normal. No congestion or rhinorrhea.     Mouth/Throat:     Mouth: Mucous membranes are moist.     Pharynx: Oropharynx is clear. No oropharyngeal exudate.  Eyes:     General: No scleral icterus.       Right eye: No discharge.        Left eye: No discharge.     Extraocular Movements: Extraocular movements intact.     Conjunctiva/sclera: Conjunctivae normal.     Pupils: Pupils are equal, round, and reactive to light.  Cardiovascular:     Rate and Rhythm: Normal  rate and regular rhythm.     Pulses: Normal pulses.     Heart sounds: No murmur heard.  No friction rub. No gallop.   Pulmonary:     Effort: Pulmonary effort is normal. No respiratory distress.     Breath sounds: Normal breath sounds. No wheezing, rhonchi or rales.  Chest:     Chest wall: No tenderness.  Abdominal:     General: Bowel sounds are normal. There is no distension.     Palpations: Abdomen is soft. There is no mass.     Tenderness: There is no abdominal tenderness. There is no right CVA tenderness, left CVA tenderness, guarding or rebound.  Musculoskeletal:        General: No swelling or tenderness.     Cervical back: Normal range of motion. No rigidity or tenderness.     Comments: Bilateral lower extremities 1-2+ edema.  Lymphadenopathy:     Cervical: No cervical adenopathy.  Skin:    General: Skin is warm.     Coloration: Skin is not pale.     Findings: Bruising present. No erythema, lesion or rash.     Comments: Left upper chest purple bruise non -tender to palpation.small purple bruise noted below left knee no tenderness noted. Right shin area 1 x 1 cm skin tear wound bed red no signs of infections noted.cleansed with saline,pat  dry.TAB ointment applied and covered with non-adhesive gauze and secured with paper tape.   Neurological:     Mental Status: She is alert and oriented to person, place, and time.     Cranial Nerves: No cranial nerve deficit.     Motor: No weakness.  Psychiatric:        Mood and Affect: Mood normal.        Speech: Speech normal.        Behavior: Behavior normal.        Thought Content: Thought content normal.        Cognition and Memory: Memory is impaired.        Judgment: Judgment normal.    Labs reviewed: Recent Labs    01/24/19 1425 08/24/19 1538  NA 133* 132*  K 4.5 4.6  CL 102 97*  CO2 23 25  GLUCOSE 104* 86  BUN 15 13  CREATININE 0.74 0.78  CALCIUM 8.9 9.7   Recent Labs    01/24/19 1425 08/24/19 1538  AST 19 19  ALT 17 17  ALKPHOS 91  --   BILITOT 0.6 0.5  PROT 7.3 7.1  ALBUMIN 3.8  --    Recent Labs    01/24/19 1425 08/24/19 1538  WBC 7.7 6.6  NEUTROABS 5.3 3,538  HGB 13.3 13.8  HCT 41.2 41.8  MCV 88.6 87.6  PLT 247 252   Lab Results  Component Value Date   TSH 2.41 08/24/2019   Lab Results  Component Value Date   HGBA1C 5.5 08/24/2019   Lab Results  Component Value Date   CHOL 152 11/04/2014   HDL 45.80 11/04/2014   LDLCALC 81 11/04/2014   TRIG 128.0 11/04/2014   CHOLHDL 3 11/04/2014    Significant Diagnostic Results in last 30 days:  No results found.  Assessment/Plan 1. Noninfected skin tear of left lower extremity, initial encounter. Right shin area 1 x 1 cm skin tear wound bed red no signs of infections noted.cleansed with saline,pat dry.TAB ointment applied and covered with non-adhesive gauze and secured with paper tape.Daughter instructed to change dressing daily as above.   2.  Fall at home, initial encounter Status post fall after foot got caught on a computer cord.sustained skin tear and bruise.Fall and safety precaution advised.   3. Bruise Left upper chest purple bruise non -tender to palpation.small purple bruise noted  below left knee no tenderness noted  4. Edema of both lower extremities Bilateral lower extremities 1-2 + edema. - For home use only DME Other see comment: Advised to wear knee high compression stockings in the morning and off at bedtime.  5. Senile osteoporosis AP spine L1- L4 T-score -2.8 reviewed.Eventity injection administered by CMA today. - Romosozumab-aqqg (EVENITY) 105 MG/1.17ML injection 210 mg  Family/ staff Communication: Reviewed plan of care with patient and daughter verbalized understanding.  Labs/tests ordered: None   Next Appointment: As needed if symptoms worsen or fail to improve.  Sandrea Hughs, NP

## 2019-12-17 ENCOUNTER — Other Ambulatory Visit: Payer: Self-pay | Admitting: *Deleted

## 2019-12-17 DIAGNOSIS — R053 Chronic cough: Secondary | ICD-10-CM

## 2019-12-17 MED ORDER — HYDROCOD POLST-CPM POLST ER 10-8 MG/5ML PO SUER: 5.0000 mL | Freq: Every evening | ORAL | 0 refills | Status: DC | PRN

## 2019-12-17 NOTE — Telephone Encounter (Signed)
Patient called requesting refill Stated that it is for her seasonal cough and Dr. Mariea Clonts is aware.  Pended Rx and sent to [facility] for approval. (Dr. Mariea Clonts out of office) Epic LR: 10/29/19

## 2019-12-31 ENCOUNTER — Ambulatory Visit: Payer: Medicare HMO

## 2020-01-01 ENCOUNTER — Other Ambulatory Visit: Payer: Self-pay

## 2020-01-01 ENCOUNTER — Ambulatory Visit: Payer: Medicare HMO

## 2020-01-01 DIAGNOSIS — M810 Age-related osteoporosis without current pathological fracture: Secondary | ICD-10-CM

## 2020-01-01 MED ORDER — ROMOSOZUMAB-AQQG 105 MG/1.17ML ~~LOC~~ SOSY
210.0000 mg | PREFILLED_SYRINGE | Freq: Once | SUBCUTANEOUS | Status: AC
  Administered 2020-01-01: 210 mg via SUBCUTANEOUS

## 2020-01-15 ENCOUNTER — Telehealth: Payer: Self-pay

## 2020-01-15 DIAGNOSIS — R05 Cough: Secondary | ICD-10-CM

## 2020-01-15 DIAGNOSIS — R053 Chronic cough: Secondary | ICD-10-CM

## 2020-01-15 MED ORDER — HYDROCOD POLST-CPM POLST ER 10-8 MG/5ML PO SUER: 5.0000 mL | Freq: Every evening | ORAL | 0 refills | Status: DC | PRN

## 2020-01-15 NOTE — Telephone Encounter (Signed)
Patient called to cancel her appointment for Monday 01/18/2020 because her daughter didn't realize that her mother's insurance would cover self referrals and she has since made an appointment with an ENT for Aug. 2 2021 but patient would like you to refill her cough medication for her to take until her appointment  I have pended medication and will send to Dr. Mariea Clonts for approval

## 2020-01-15 NOTE — Telephone Encounter (Signed)
I called patient and spoke with patient to let her know Dr. Mariea Clonts had approved her refill request

## 2020-01-15 NOTE — Telephone Encounter (Signed)
Ok, cough medicine order sent to pharmacy.

## 2020-01-18 ENCOUNTER — Ambulatory Visit: Payer: Medicare HMO | Admitting: Internal Medicine

## 2020-01-27 ENCOUNTER — Other Ambulatory Visit: Payer: Self-pay | Admitting: *Deleted

## 2020-01-27 MED ORDER — LORAZEPAM 1 MG PO TABS: 1.0000 mg | ORAL_TABLET | Freq: Three times a day (TID) | ORAL | 0 refills | Status: DC | PRN

## 2020-01-27 NOTE — Telephone Encounter (Signed)
It also says it was last prescribed 4 years ago.  Has she been using it?  Did she actually request it?

## 2020-01-27 NOTE — Telephone Encounter (Signed)
[facility] called requesting refill Previous Provider Prescribed.  Needs contract signed, added note to future appointment.  Pended Rx and sent to Dr. Mariea Clonts for approval.

## 2020-01-27 NOTE — Telephone Encounter (Signed)
Yes she only uses it as needed. And she did call and request also. She also stated that after this refill at [facility] she will no longer be using them, she will be changing to [facility].

## 2020-02-03 ENCOUNTER — Ambulatory Visit: Payer: Medicare HMO

## 2020-02-03 ENCOUNTER — Other Ambulatory Visit: Payer: Self-pay

## 2020-02-03 DIAGNOSIS — M810 Age-related osteoporosis without current pathological fracture: Secondary | ICD-10-CM

## 2020-02-03 MED ORDER — ROMOSOZUMAB-AQQG 105 MG/1.17ML ~~LOC~~ SOSY
210.0000 mg | PREFILLED_SYRINGE | Freq: Once | SUBCUTANEOUS | Status: AC
  Administered 2020-02-03: 210 mg via SUBCUTANEOUS

## 2020-02-10 ENCOUNTER — Other Ambulatory Visit: Payer: Self-pay

## 2020-02-10 DIAGNOSIS — R053 Chronic cough: Secondary | ICD-10-CM

## 2020-02-10 DIAGNOSIS — R05 Cough: Secondary | ICD-10-CM

## 2020-02-10 MED ORDER — HYDROCOD POLST-CPM POLST ER 10-8 MG/5ML PO SUER: 5.0000 mL | Freq: Every evening | ORAL | 0 refills | Status: DC | PRN

## 2020-02-10 NOTE — Telephone Encounter (Addendum)
You approved this earlier, but patient is requesting it be sent to another pharmacy. I called [facility] and canceled the original Rx.

## 2020-02-10 NOTE — Telephone Encounter (Signed)
Patient requested a refill. She states she has tried various OTC cough syrups and they are not helping, and she is not sleeping at night.   Last refilled 01/15/20.   [place] appt 03/10/20.  She states she has an appointment with an ENT on 02/19/20.

## 2020-02-26 ENCOUNTER — Other Ambulatory Visit: Payer: Self-pay

## 2020-02-26 MED ORDER — LORAZEPAM 1 MG PO TABS: 1.0000 mg | ORAL_TABLET | Freq: Three times a day (TID) | ORAL | 0 refills | Status: DC | PRN

## 2020-02-26 NOTE — Telephone Encounter (Signed)
Incoming fax received from [facility] requesting a refill on Lorazepam.  RX last filled in Epic on 01/27/2020  No treatment agreement on file, notation to sign treatment agreement on pending appointment notes

## 2020-03-03 ENCOUNTER — Ambulatory Visit: Payer: Medicare HMO | Admitting: Internal Medicine

## 2020-03-07 ENCOUNTER — Other Ambulatory Visit: Payer: Self-pay

## 2020-03-07 ENCOUNTER — Ambulatory Visit: Payer: Medicare HMO

## 2020-03-07 DIAGNOSIS — M810 Age-related osteoporosis without current pathological fracture: Secondary | ICD-10-CM | POA: Diagnosis not present

## 2020-03-07 DIAGNOSIS — R053 Chronic cough: Secondary | ICD-10-CM

## 2020-03-07 MED ORDER — ROMOSOZUMAB-AQQG 105 MG/1.17ML ~~LOC~~ SOSY
105.0000 mg | PREFILLED_SYRINGE | Freq: Once | SUBCUTANEOUS | Status: AC
  Administered 2020-03-07: 210 mg via SUBCUTANEOUS

## 2020-03-07 MED ORDER — HYDROCOD POLST-CPM POLST ER 10-8 MG/5ML PO SUER: 5.0000 mL | Freq: Every evening | ORAL | 0 refills | Status: DC | PRN

## 2020-03-07 NOTE — Telephone Encounter (Signed)
Patient wants to know if she could have her cough medication, chlorpheniramine/hydrocodone 10-8 mg/5 ml, refilled. She is still having coughing at night and this medication is the only thing that helps. She thought it may have been just her allergies, but seems to be increasing at night.She has been taking the medication the ENT gave her, but doesn't seem to be helping. She says the ENT gave her gabapentin

## 2020-03-07 NOTE — Telephone Encounter (Signed)
This encounter was created in error - please disregard.

## 2020-03-10 ENCOUNTER — Ambulatory Visit: Payer: Medicare HMO | Admitting: Internal Medicine

## 2020-03-21 ENCOUNTER — Ambulatory Visit: Payer: Medicare HMO | Admitting: Internal Medicine

## 2020-03-24 ENCOUNTER — Ambulatory Visit: Payer: Medicare HMO | Admitting: Internal Medicine

## 2020-03-25 ENCOUNTER — Other Ambulatory Visit (HOSPITAL_COMMUNITY): Payer: Self-pay | Admitting: Internal Medicine

## 2020-03-25 DIAGNOSIS — Z1231 Encounter for screening mammogram for malignant neoplasm of breast: Secondary | ICD-10-CM

## 2020-03-31 ENCOUNTER — Other Ambulatory Visit: Payer: Self-pay | Admitting: *Deleted

## 2020-03-31 MED ORDER — LORAZEPAM 1 MG PO TABS: 1.0000 mg | ORAL_TABLET | Freq: Three times a day (TID) | ORAL | 5 refills | Status: DC | PRN

## 2020-03-31 NOTE — Telephone Encounter (Signed)
Received fax from pharmacy  No Contract on File. Note added to appt. Epic LR: 02/26/2020 Pended Rx and sent to Dr. Mariea Clonts for approval.

## 2020-04-04 ENCOUNTER — Ambulatory Visit: Payer: Medicare HMO | Admitting: Internal Medicine

## 2020-04-07 ENCOUNTER — Encounter: Payer: Self-pay | Admitting: Internal Medicine

## 2020-04-07 ENCOUNTER — Other Ambulatory Visit: Payer: Self-pay

## 2020-04-07 ENCOUNTER — Ambulatory Visit (INDEPENDENT_AMBULATORY_CARE_PROVIDER_SITE_OTHER): Payer: Medicare HMO | Admitting: Internal Medicine

## 2020-04-07 VITALS — BP 132/84 | HR 76 | Temp 97.1°F | Ht 64.0 in | Wt 162.4 lb

## 2020-04-07 DIAGNOSIS — R053 Chronic cough: Secondary | ICD-10-CM | POA: Diagnosis not present

## 2020-04-07 DIAGNOSIS — F4329 Adjustment disorder with other symptoms: Secondary | ICD-10-CM | POA: Diagnosis not present

## 2020-04-07 DIAGNOSIS — M810 Age-related osteoporosis without current pathological fracture: Secondary | ICD-10-CM

## 2020-04-07 DIAGNOSIS — S22000A Wedge compression fracture of unspecified thoracic vertebra, initial encounter for closed fracture: Secondary | ICD-10-CM | POA: Diagnosis not present

## 2020-04-07 DIAGNOSIS — M5416 Radiculopathy, lumbar region: Secondary | ICD-10-CM

## 2020-04-07 MED ORDER — ROMOSOZUMAB-AQQG 105 MG/1.17ML ~~LOC~~ SOSY: 210.0000 mg | PREFILLED_SYRINGE | Freq: Once | SUBCUTANEOUS | Status: DC

## 2020-04-07 MED ORDER — ROMOSOZUMAB-AQQG 105 MG/1.17ML ~~LOC~~ SOSY: 105.0000 mg | PREFILLED_SYRINGE | Freq: Once | SUBCUTANEOUS | Status: DC

## 2020-04-07 NOTE — Patient Instructions (Addendum)
Take the gabapentin 300mg  (3 100mg  capsules) every night for your cough. If it's bad a few nights in a row, take your hydrocodone syrup.  Try salonpas with lidocaine patches on your back especially when you are more active and up and about.    Please get your covid booster and your flu shot at least 2 weeks apart.

## 2020-04-07 NOTE — Progress Notes (Signed)
Location:  [facility] clinic Provider:  Devyn Griffing L. Mariea Clonts, D.O., C.M.D.  Goals of Care:  Advanced Directives 04/07/2020  Does Patient Have a Medical Advance Directive? Yes  Type of Advance Directive Out of facility DNR (pink MOST or yellow form);Healthcare Power of Attorney  Does patient want to make changes to medical advance directive? No - Patient declined  Copy of [place] in Chart? Yes - validated most recent copy scanned in chart (See row information)  Would patient like information on creating a medical advance directive? -  Pre-existing out of facility DNR order (yellow form or pink MOST form) Pink MOST/Yellow Form most recent copy in chart - Physician notified to receive inpatient order   Chief Complaint  Patient presents with  . Medical Management of Chronic Issues    4 month follow up   . Acute Visit    evenity injection, contract signed, cough that she can not get rid up.Pain in thigh/hip area   . Health Maintenance    PNA, Influenza     HPI: Patient is a 86 y.o. female seen today for medical management of chronic diseases.    Things are about the same.  She cannot complain or tries not to.    She's having a problem with her legs and has had to use her cane a bit more.  She had not been walking much with all of the stress she was under, but her daughter is trying to get her out more now with her.    Her cough is relentless.  The ENT gave her a Rx for gabapentin.  She was to start off with 100mg  and then add to that as her body tolerated it.  She got up to 2 without no success--still coughing in day and night.  Has sudden episodes.  It's hard to control w/o water or a lozenge.  They said it would take 3-4 wks to work.  She has been back to using the hydrocodone cough syrup two nights.  She has no coughing then and feels so much better the next day.  The coughing is zapping all energy.  She went back on the gabapentin again, but she doesn't want to wait that 3-4  wks.  She's coming on and off the gabapentin.  She slept ok two nights last week after taking 200mg , but then it started up again.  They went to [place] last [facility] at [facility] so she was up late.  Took the cough syrup last night and woke up at 4am to come here today.  She still does not feel anything in her throat.    The back pain after being on her feet and busy for a period of time is the other problem. Like if she cleans--can do for 25 to 30 mins, has to rest and restart.  1.5 yr ago, she could go all day.  Pain radiates around from her back to front.    They finally got everything settled with the will thing.  She is much better than a couple of weeks ago with anxiety.  She is much spunkier and has been going through items at her daughter's house.    She has been having pain in her right hip and goes through thigh.  It will be a quick sharp pain.  Some nights she has a dull throbbing ache in the lower leg like RLS is supposed to feel like.  She can't get comfortable any way and has to walk  around and takes two tylenol.    Past Medical History:  Diagnosis Date  . Anxiety   . Chest pain 02/08/15   ETT normal  . Diverticulosis    Diverticulitis (1982)  . Fracture of rib of right side 08/16/2013  . GERD (gastroesophageal reflux disease)   . Hyperlipidemia, mixed   . Hypothyroidism   . IBS (irritable bowel syndrome)   . Morton's neuroma   . Right lumbar radiculopathy    Intermittent x 2 episodes in summer 2014  . Shingles 1967 and 2010  . Ulcer   . Urine incontinence   . Vertigo     Past Surgical History:  Procedure Laterality Date  . CARDIOVASCULAR STRESS TEST  02/08/15   ETT normal  . CATARACT EXTRACTION  2012   Dr. Herbert Deaner  . COLONOSCOPY  2005   Dr. Arsenio Loader at [facility] (normal per pt report)  . DEXA  11/03/13   Heel T score -0.8.  Marland Kitchen [place]   right foot  . TONSILLECTOMY    . TONSILLECTOMY AND ADENOIDECTOMY  1948   Dr. [place]  . TUBAL  LIGATION  1974  . WISDOM TOOTH EXTRACTION      No Known Allergies  Outpatient Encounter Medications as of 04/07/2020  Medication Sig  . acetaminophen (TYLENOL) 500 MG tablet Take 1,000 mg by mouth every 6 (six) hours as needed for headache.   Marland Kitchen aspirin 81 MG tablet Take 81 mg by mouth every morning.   Marland Kitchen atorvastatin (LIPITOR) 10 MG tablet Take 10 mg by mouth daily.  . chlorpheniramine-HYDROcodone (TUSSIONEX PENNKINETIC ER) 10-8 MG/5ML SUER Take 5 mLs by mouth at bedtime as needed for cough.  . gabapentin (NEURONTIN) 100 MG capsule Take by mouth.  . levocetirizine (XYZAL) 5 MG tablet Take 5 mg by mouth daily.  Marland Kitchen levothyroxine (SYNTHROID, LEVOTHROID) 88 MCG tablet Take 1 tablet (88 mcg total) by mouth daily.  Marland Kitchen LORazepam (ATIVAN) 1 MG tablet Take 1 tablet (1 mg total) by mouth every 8 (eight) hours as needed for anxiety.  . Magnesium 250 MG TABS Take 2 tablets by mouth daily.   Marland Kitchen omeprazole (PRILOSEC) 40 MG capsule Take 40 mg by mouth daily.   . psyllium (HYDROCIL/METAMUCIL) 95 % PACK Take 1 packet by mouth daily.   Facility-Administered Encounter Medications as of 04/07/2020  Medication  . Romosozumab-aqqg (EVENITY) 105 MG/1.17ML injection 105 mg    Review of Systems:  Review of Systems  Constitutional: Negative for chills, fever, malaise/fatigue and weight loss.  HENT: Positive for hearing loss. Negative for congestion and sore throat.   Eyes: Negative for blurred vision.       Glasses  Respiratory: Positive for cough. Negative for hemoptysis, sputum production, shortness of breath and wheezing.   Cardiovascular: Negative for chest pain, palpitations and leg swelling.  Gastrointestinal: Negative for abdominal pain.  Genitourinary: Positive for frequency and urgency. Negative for dysuria.  Musculoskeletal: Positive for back pain and joint pain. Negative for falls.  Skin: Negative for itching and rash.  Neurological: Positive for tingling and sensory change. Negative for loss of  consciousness.  Endo/Heme/Allergies: Negative for environmental allergies. Bruises/bleeds easily.  Psychiatric/Behavioral: Positive for depression and memory loss. The patient is nervous/anxious and has insomnia.        Not sleeping well if up coughing; depression and anxiety improving now that business with her will is taken care of    Health Maintenance  Topic Date Due  . PNA vac Low Risk  Adult (2 of 2 - PPSV23) 03/20/2014  . INFLUENZA VACCINE  01/24/2020  . MAMMOGRAM  04/21/2020  . TETANUS/TDAP  12/19/2028  . DEXA SCAN  Completed  . COVID-19 Vaccine  Completed    Physical Exam: Vitals:   04/07/20 1029  Weight: 162 lb 6.4 oz (73.7 kg)  Height: 5\' 4"  (1.626 m)   Body mass index is 27.88 kg/m. Physical Exam Vitals reviewed.  Constitutional:      General: She is not in acute distress.    Appearance: Normal appearance. She is not toxic-appearing.  HENT:     Head: Normocephalic and atraumatic.  Eyes:     Comments: glasses  Cardiovascular:     Rate and Rhythm: Normal rate and regular rhythm.     Pulses: Normal pulses.     Heart sounds: Normal heart sounds.  Pulmonary:     Effort: Pulmonary effort is normal.     Breath sounds: Normal breath sounds.  Abdominal:     General: Bowel sounds are normal. There is no distension.     Palpations: Abdomen is soft.     Tenderness: There is no abdominal tenderness.  Musculoskeletal:        General: Normal range of motion.     Right lower leg: No edema.     Left lower leg: No edema.  Skin:    General: Skin is warm and dry.  Neurological:     General: No focal deficit present.     Mental Status: She is alert and oriented to person, place, and time.     Gait: Gait abnormal.     Comments: Uses walker  Psychiatric:        Mood and Affect: Mood normal.        Behavior: Behavior normal.     Labs reviewed: Basic Metabolic Panel: Recent Labs    08/24/19 1538  NA 132*  K 4.6  CL 97*  CO2 25  GLUCOSE 86  BUN 13  CREATININE  0.78  CALCIUM 9.7  TSH 2.41   Liver Function Tests: Recent Labs    08/24/19 1538  AST 19  ALT 17  BILITOT 0.5  PROT 7.1   No results for input(s): LIPASE, AMYLASE in the last 8760 hours. No results for input(s): AMMONIA in the last 8760 hours. CBC: Recent Labs    08/24/19 1538  WBC 6.6  NEUTROABS 3,538  HGB 13.8  HCT 41.8  MCV 87.6  PLT 252   Lipid Panel: No results for input(s): CHOL, HDL, LDLCALC, TRIG, CHOLHDL, LDLDIRECT in the last 8760 hours. Lab Results  Component Value Date   HGBA1C 5.5 08/24/2019    Procedures since last visit: No results found.  Assessment/Plan 1. Senile osteoporosis - cont to work on increasing walking,  Calcium was wnl last check, ideally should be on D3 and I don't see it on her list--need to address when comes for next evenity - Romosozumab-aqqg (EVENITY) 105 MG/1.17ML injection 210 mg given today  2. Chronic cough -counseled to faithfully take the 300mg  gabapentin and if she does have breakthrough cough, then use the hydrocodone syrup--avoid regular use of it (she has not been using it regularly)  3. Compression fracture of body of thoracic vertebra (HCC) -try salonpas with lidocaine patch over affected vertebra--may help pain around ribs  4. Adjustment disorder with other symptom -doing a lot better here, lorazepam has helped tremendously through a stressful time  5. Lumbar radiculopathy -may be helped also with regular use of the gabapentin -discussed  therapy again but they are going to go with just walking more at this point -try salonpas with lidocaine on back or buttock area   Labs/tests ordered:   Lab Orders  No laboratory test(s) ordered today   Next appt:  08/08/2020   Kiasha Bellin L. Jaishawn Witzke, D.O. [facility] [address], [place] [address] Cell Phone (Mon-Fri 8am-5pm):  [phone] On Call:  [phone] & follow prompts after 5pm & weekends Office Phone:   [phone] Office Fax:  [phone]

## 2020-04-08 ENCOUNTER — Ambulatory Visit: Payer: Medicare HMO

## 2020-04-25 ENCOUNTER — Other Ambulatory Visit: Payer: Self-pay

## 2020-04-25 ENCOUNTER — Ambulatory Visit (HOSPITAL_COMMUNITY)
Admission: RE | Admit: 2020-04-25 | Discharge: 2020-04-25 | Disposition: A | Discharge status: Home / Self Care | Payer: Medicare HMO | Source: Ambulatory Visit | Attending: Internal Medicine | Admitting: Internal Medicine

## 2020-04-25 DIAGNOSIS — Z1231 Encounter for screening mammogram for malignant neoplasm of breast: Secondary | ICD-10-CM

## 2020-05-09 ENCOUNTER — Other Ambulatory Visit: Payer: Self-pay

## 2020-05-09 ENCOUNTER — Ambulatory Visit (INDEPENDENT_AMBULATORY_CARE_PROVIDER_SITE_OTHER): Payer: Medicare HMO

## 2020-05-09 DIAGNOSIS — M810 Age-related osteoporosis without current pathological fracture: Secondary | ICD-10-CM

## 2020-05-09 MED ORDER — ROMOSOZUMAB-AQQG 105 MG/1.17ML ~~LOC~~ SOSY
105.0000 mg | PREFILLED_SYRINGE | Freq: Once | SUBCUTANEOUS | Status: AC
  Administered 2020-05-09: 210 mg via SUBCUTANEOUS

## 2020-05-14 ENCOUNTER — Ambulatory Visit
Admission: EM | Admit: 2020-05-14 | Discharge: 2020-05-14 | Disposition: A | Discharge status: Home / Self Care | Payer: Medicare HMO | Attending: Emergency Medicine | Admitting: Emergency Medicine

## 2020-05-14 ENCOUNTER — Encounter: Payer: Self-pay | Admitting: Emergency Medicine

## 2020-05-14 DIAGNOSIS — K5732 Diverticulitis of large intestine without perforation or abscess without bleeding: Secondary | ICD-10-CM | POA: Diagnosis not present

## 2020-05-14 DIAGNOSIS — R1032 Left lower quadrant pain: Secondary | ICD-10-CM | POA: Diagnosis not present

## 2020-05-14 MED ORDER — METRONIDAZOLE 500 MG PO TABS: 500.0000 mg | ORAL_TABLET | Freq: Two times a day (BID) | ORAL | 0 refills | Status: DC

## 2020-05-14 MED ORDER — CIPROFLOXACIN HCL 500 MG PO TABS: 500.0000 mg | ORAL_TABLET | Freq: Two times a day (BID) | ORAL | 0 refills | Status: DC

## 2020-05-14 MED ORDER — CIPROFLOXACIN HCL 500 MG PO TABS: 500.0000 mg | ORAL_TABLET | Freq: Two times a day (BID) | ORAL | 0 refills | Status: AC

## 2020-05-14 NOTE — ED Provider Notes (Signed)
Julie Park   [id number] 05/14/20 Arrival Time: 2423    Chief Complaint  Patient presents with  . Diverticulitis    SUBJECTIVE:  Julie Park is a 86 y.o. female with history of diverticulitis who presented to the urgent care for complaint of left lower abdominal pain that started last night.  Denies a precipitating event, trauma, close contacts with similar symptoms, recent travel or antibiotic use.  Localizes pain to left lower quadrant.  Describes as intermittent and aching character.  Has not tried any OTC medication.  Denies alleviating or aggravating factors.  Reports similar symptoms in the past that improved with Cipro and Flagyl.  Denies chills, fever, nausea, vomiting, diarrhea.  No LMP recorded. Patient is postmenopausal.  ROS: As per HPI.  All other pertinent ROS negative.     Past Medical History:  Diagnosis Date  . Anxiety   . Chest pain 02/08/15   ETT normal  . Diverticulosis    Diverticulitis (1982)  . Fracture of rib of right side 08/16/2013  . GERD (gastroesophageal reflux disease)   . Hyperlipidemia, mixed   . Hypothyroidism   . IBS (irritable bowel syndrome)   . Morton's neuroma   . Right lumbar radiculopathy    Intermittent x 2 episodes in summer 2014  . Shingles 1967 and 2010  . Ulcer   . Urine incontinence   . Vertigo    Past Surgical History:  Procedure Laterality Date  . CARDIOVASCULAR STRESS TEST  02/08/15   ETT normal  . CATARACT EXTRACTION  2012   Dr. Herbert Deaner  . COLONOSCOPY  2005   Dr. Arsenio Loader at [facility] (normal per pt report)  . DEXA  11/03/13   Heel T score -0.8.  Marland Kitchen [place]   right foot  . TONSILLECTOMY    . TONSILLECTOMY AND ADENOIDECTOMY  1948   Dr. Elias-Fela Solis  . TUBAL LIGATION  1974  . WISDOM TOOTH EXTRACTION     No Known Allergies Current Facility-Administered Medications on File Prior to Encounter  Medication Dose Route Frequency Provider Last Rate Last Admin  . Romosozumab-aqqg  (EVENITY) 105 MG/1.17ML injection 210 mg  210 mg Subcutaneous Once Mariea Clonts, Tiffany L, DO       Current Outpatient Medications on File Prior to Encounter  Medication Sig Dispense Refill  . acetaminophen (TYLENOL) 500 MG tablet Take 1,000 mg by mouth every 6 (six) hours as needed for headache.     Marland Kitchen aspirin 81 MG tablet Take 81 mg by mouth every morning.     Marland Kitchen atorvastatin (LIPITOR) 10 MG tablet Take 10 mg by mouth daily.    . chlorpheniramine-HYDROcodone (TUSSIONEX PENNKINETIC ER) 10-8 MG/5ML SUER Take 5 mLs by mouth at bedtime as needed for cough. 140 mL 0  . gabapentin (NEURONTIN) 100 MG capsule Take 300 mg by mouth at bedtime.    Marland Kitchen levothyroxine (SYNTHROID, LEVOTHROID) 88 MCG tablet Take 1 tablet (88 mcg total) by mouth daily. 90 tablet 3  . LORazepam (ATIVAN) 1 MG tablet Take 1 tablet (1 mg total) by mouth every 8 (eight) hours as needed for anxiety. 90 tablet 5  . Magnesium 250 MG TABS Take 2 tablets by mouth daily.     Marland Kitchen omeprazole (PRILOSEC) 40 MG capsule Take 40 mg by mouth daily.     . psyllium (HYDROCIL/METAMUCIL) 95 % PACK Take 1 packet by mouth daily.     Social History   Socioeconomic History  . Marital status: Single  Spouse name: Not on file  . Number of children: Not on file  . Years of education: Not on file  . Highest education level: Not on file  Occupational History  . Occupation: Retired  Tobacco Use  . Smoking status: Former Smoker    Packs/day: 1.00    Years: 8.00    Pack years: 8.00    Types: Cigarettes    Start date: 06/26/1983  . Smokeless tobacco: Never Used  Vaping Use  . Vaping Use: Never used  Substance and Sexual Activity  . Alcohol use: No  . Drug use: No  . Sexual activity: Not Currently  Other Topics Concern  . Not on file  Social History Narrative   Divorced, 2 children.   Lives in [place].   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   Tobacco: small amount, distant  past.  Alcohol: none.           Social History      Diet? n/a      Do you drink/eat things with caffeine? coffee      Marital status?     Divorced (1984)                              What year were you married? 1958      Do you live in a house, apartment, assisted living, condo, trailer, etc.? house      Is it one or more stories?  yes      How many persons live in your home? Living with daughter and husband at the present      Do you have any pets in your home? (please list) no       Highest level of education completed? 16 years      Current or past profession: teacher grades 1 and 2, education coordinator [employer]       Do you exercise?                   yes                   Type & how often? Walk 1-3 times per week      Advanced Directives      Do you have a living will? yes      Do you have a DNR form?                                  If not, do you want to discuss one?      Do you have signed POA/HPOA for forms? yes      Functional Status      Do you have difficulty bathing or dressing yourself?      Do you have difficulty preparing food or eating?       Do you have difficulty managing your medications?      Do you have difficulty managing your finances?      Do you have difficulty affording your medications?      Social Determinants of Health   Financial Resource Strain:   . Difficulty of Paying Living Expenses: Not on file  Food Insecurity:   . Worried About Charity fundraiser in the Last Year: Not on file  . Ran Out of Food in the  Last Year: Not on file  Transportation Needs:   . Lack of Transportation (Medical): Not on file  . Lack of Transportation (Non-Medical): Not on file  Physical Activity:   . Days of Exercise per Week: Not on file  . Minutes of Exercise per Session: Not on file  Stress:   . Feeling of Stress : Not on file  Social Connections:   . Frequency of Communication with Friends and Family: Not on file  . Frequency of Social  Gatherings with Friends and Family: Not on file  . Attends Religious Services: Not on file  . Active Member of Clubs or Organizations: Not on file  . Attends Archivist Meetings: Not on file  . Marital Status: Not on file  Intimate Partner Violence:   . Fear of Current or Ex-Partner: Not on file  . Emotionally Abused: Not on file  . Physically Abused: Not on file  . Sexually Abused: Not on file   Family History  Problem Relation Age of Onset  . Tuberculosis Mother   . Pancreatic cancer Father   . Asthma Daughter   . Heart attack Paternal Grandfather      OBJECTIVE:  Vitals:   05/14/20 1056  BP: (!) 168/77  Pulse: 82  Resp: 16  Temp: 98.4 F (36.9 C)  SpO2: 98%    General appearance: Alert; NAD HEENT: NCAT.  Oropharynx clear.  Lungs: clear to auscultation bilaterally without adventitious breath sounds Heart: regular rate and rhythm.  Radial pulses 2+ symmetrical bilaterally Abdomen: soft, mildy distended; normal active bowel sounds; non-tender to light ; tender to deep palpation in left lower quadrant; nontender at McBurney's point; negative Murphy's sign; negative rebound; no guarding Back: no CVA tenderness Extremities: no edema; symmetrical with no gross deformities Skin: warm and dry Neurologic: normal gait Psychological: alert and cooperative; normal mood and affect  LABS: No results found for this or any previous visit (from the past 24 hour(s)).  DIAGNOSTIC STUDIES: No results found.   ASSESSMENT & PLAN:  1. Diverticulitis of colon   2. Abdominal pain, left lower quadrant     Meds ordered this encounter  Medications  . ciprofloxacin (CIPRO) 500 MG tablet    Sig: Take 1 tablet (500 mg total) by mouth 2 (two) times daily for 7 days.    Dispense:  14 tablet    Refill:  0  . metroNIDAZOLE (FLAGYL) 500 MG tablet    Sig: Take 1 tablet (500 mg total) by mouth 2 (two) times daily.    Dispense:  14 tablet    Refill:  0    Discharge  Instructions  Get rest and drink fluids Eat a well-balanced diet May incorporate yogurt into your diet to help prevent C. difficile Ciprofloxacin was prescribed/take as directed Flagyl was prescribed/take as directed Follow-up with PCP Go to ED fever, chills, nausea, vomiting, diarrhea, bloody or dark tarry stools, constipation, urinary symptoms, worsening abdominal discomfort, symptoms that do not improve with medications, inability to keep fluids down, etc...  Reviewed expectations re: course of current medical issues. Questions answered. Outlined signs and symptoms indicating need for more acute intervention. Patient verbalized understanding. After Visit Summary given.Emerson Monte, FNP 05/14/20 1146

## 2020-05-14 NOTE — Discharge Instructions (Addendum)
Get rest and drink fluids Eat a well-balanced diet May incorporate yogurt into your diet to help prevent C. difficile Ciprofloxacin was prescribed/take as directed Flagyl was prescribed/take as directed Follow-up with PCP Go to ED fever, chills, nausea, vomiting, diarrhea, bloody or dark tarry stools, constipation, urinary symptoms, worsening abdominal discomfort, symptoms that do not improve with medications, inability to keep fluids down, etc..Marland Kitchen

## 2020-05-14 NOTE — ED Triage Notes (Signed)
Left lower abdominal pain that began last night , hx of diverticulitis. feels like it did before when she has a flare up. denies any n/v/d.

## 2020-05-23 ENCOUNTER — Other Ambulatory Visit: Payer: Self-pay

## 2020-05-23 DIAGNOSIS — R053 Chronic cough: Secondary | ICD-10-CM

## 2020-05-23 MED ORDER — HYDROCOD POLST-CPM POLST ER 10-8 MG/5ML PO SUER: 5.0000 mL | Freq: Every evening | ORAL | 0 refills | Status: DC | PRN

## 2020-05-23 NOTE — Telephone Encounter (Signed)
Patient called to request a refill of this medication as she has started coughing again at night

## 2020-05-25 ENCOUNTER — Ambulatory Visit (HOSPITAL_COMMUNITY): Payer: Medicare HMO

## 2020-06-01 ENCOUNTER — Ambulatory Visit (HOSPITAL_COMMUNITY): Payer: Medicare HMO

## 2020-06-01 ENCOUNTER — Encounter (HOSPITAL_COMMUNITY): Payer: Self-pay

## 2020-06-02 ENCOUNTER — Other Ambulatory Visit: Payer: Self-pay | Admitting: *Deleted

## 2020-06-02 ENCOUNTER — Telehealth: Payer: Self-pay

## 2020-06-02 MED ORDER — ATORVASTATIN CALCIUM 10 MG PO TABS
10.0000 mg | ORAL_TABLET | Freq: Every day | ORAL | 1 refills | Status: DC
Start: 2020-06-02 — End: 2020-11-22

## 2020-06-02 MED ORDER — OMEPRAZOLE 40 MG PO CPDR
40.0000 mg | DELAYED_RELEASE_CAPSULE | Freq: Every day | ORAL | 1 refills | Status: DC
Start: 2020-06-02 — End: 2020-11-28

## 2020-06-02 MED ORDER — LEVOTHYROXINE SODIUM 88 MCG PO TABS
88.0000 ug | ORAL_TABLET | Freq: Every day | ORAL | 3 refills | Status: DC
Start: 2020-06-02 — End: 2021-05-24

## 2020-06-02 NOTE — Telephone Encounter (Signed)
u stated she really does not feel it's necessary to follow up with the methacholine challenge test.  she really does not feel like having the chest thing done . ===View-only below this line=== ----- Message ----- From: Gayland Curry, DO Sent: 05/31/2020   6:36 PM EST To: Tanna Savoy, CMA Subject: methacholine challenge test                    Please let Lu know that we received this letter that says she missed her methacholine challenge test with her lung specialist.  She should follow-up on this.   ----- Message ----- From: Willette Brace Sent: 05/31/2020   4:54 PM EST To: Gayland Curry, DO

## 2020-06-02 NOTE — Telephone Encounter (Signed)
Patient requested refills to be sent to [facility].

## 2020-06-10 ENCOUNTER — Ambulatory Visit (INDEPENDENT_AMBULATORY_CARE_PROVIDER_SITE_OTHER): Payer: Medicare HMO | Admitting: *Deleted

## 2020-06-10 ENCOUNTER — Other Ambulatory Visit: Payer: Self-pay

## 2020-06-10 DIAGNOSIS — M810 Age-related osteoporosis without current pathological fracture: Secondary | ICD-10-CM

## 2020-06-10 MED ORDER — ROMOSOZUMAB-AQQG 105 MG/1.17ML ~~LOC~~ SOSY
105.0000 mg | PREFILLED_SYRINGE | Freq: Once | SUBCUTANEOUS | Status: AC
  Administered 2020-06-10: 105 mg via SUBCUTANEOUS

## 2020-07-12 ENCOUNTER — Ambulatory Visit: Payer: Medicare HMO

## 2020-07-14 ENCOUNTER — Other Ambulatory Visit: Payer: Self-pay

## 2020-07-14 ENCOUNTER — Ambulatory Visit (HOSPITAL_COMMUNITY): Payer: Medicare HMO

## 2020-07-14 ENCOUNTER — Ambulatory Visit (INDEPENDENT_AMBULATORY_CARE_PROVIDER_SITE_OTHER): Payer: Medicare HMO | Admitting: *Deleted

## 2020-07-14 DIAGNOSIS — M810 Age-related osteoporosis without current pathological fracture: Secondary | ICD-10-CM | POA: Diagnosis not present

## 2020-07-14 MED ORDER — ROMOSOZUMAB-AQQG 105 MG/1.17ML ~~LOC~~ SOSY
210.0000 mg | PREFILLED_SYRINGE | Freq: Once | SUBCUTANEOUS | Status: AC
  Administered 2020-07-14: 210 mg via SUBCUTANEOUS

## 2020-07-22 ENCOUNTER — Other Ambulatory Visit: Payer: Self-pay

## 2020-07-22 ENCOUNTER — Encounter: Payer: Self-pay | Admitting: Adult Health

## 2020-07-22 ENCOUNTER — Other Ambulatory Visit: Payer: Self-pay | Admitting: *Deleted

## 2020-07-22 ENCOUNTER — Ambulatory Visit (INDEPENDENT_AMBULATORY_CARE_PROVIDER_SITE_OTHER): Payer: Medicare HMO | Admitting: Adult Health

## 2020-07-22 VITALS — BP 160/70 | HR 79 | Temp 96.5°F | Resp 20 | Ht 64.0 in | Wt 162.6 lb

## 2020-07-22 DIAGNOSIS — K5792 Diverticulitis of intestine, part unspecified, without perforation or abscess without bleeding: Secondary | ICD-10-CM

## 2020-07-22 MED ORDER — CIPROFLOXACIN HCL 500 MG PO TABS: 500.0000 mg | ORAL_TABLET | Freq: Two times a day (BID) | ORAL | 0 refills | Status: DC

## 2020-07-22 MED ORDER — METRONIDAZOLE 500 MG PO TABS: 500.0000 mg | ORAL_TABLET | Freq: Two times a day (BID) | ORAL | 0 refills | Status: AC

## 2020-07-22 MED ORDER — CIPROFLOXACIN HCL 500 MG PO TABS: 500.0000 mg | ORAL_TABLET | Freq: Two times a day (BID) | ORAL | 0 refills | Status: AC

## 2020-07-22 NOTE — Progress Notes (Signed)
[facility] clinic  Provider:   Durenda Age - DNP  Code Status:     Full Code  Goals of Care:  Advanced Directives 07/22/2020  Does Patient Have a Medical Advance Directive? Yes  Type of Advance Directive -  Does patient want to make changes to medical advance directive? No - Patient declined  Copy of [place] in Chart? -  Would patient like information on creating a medical advance directive? -  Pre-existing out of facility DNR order (yellow form or pink MOST form) -     Chief Complaint  Patient presents with  . Acute Visit    Diverticulitis Flare up    HPI: Patient is a 87 y.o. female seen today for an acute visit for LLQ abdominal pain. She woke up yesterday morning with a crumpy abdominal pain. Pain feels better when she walks and gets worse when she sits or lays down. "I felt like as if I have to pass gas." This morning, she woke up with abdominal tenderness on her LLQ. She had a bowel movement yesterday but it was "thin". She denies having fever. She moves her bowels regularly/everyday. She has been having family stress lately. Her daughter  drove her to the clinic today. She walks with a cane. She stated that she went to the urgent care last November and was given Cipro and Flagyl.   Past Medical History:  Diagnosis Date  . Anxiety   . Chest pain 02/08/15   ETT normal  . Diverticulosis    Diverticulitis (1982)  . Fracture of rib of right side 08/16/2013  . GERD (gastroesophageal reflux disease)   . Hyperlipidemia, mixed   . Hypothyroidism   . IBS (irritable bowel syndrome)   . Morton's neuroma   . Right lumbar radiculopathy    Intermittent x 2 episodes in summer 2014  . Shingles 1967 and 2010  . Ulcer   . Urine incontinence   . Vertigo     Past Surgical History:  Procedure Laterality Date  . CARDIOVASCULAR STRESS TEST  02/08/15   ETT normal  . CATARACT EXTRACTION  2012   Dr. Herbert Deaner  . COLONOSCOPY  2005   Dr. Arsenio Loader at [facility] (normal per  pt report)  . DEXA  11/03/13   Heel T score -0.8.  Marland Kitchen [place]   right foot  . TONSILLECTOMY    . TONSILLECTOMY AND ADENOIDECTOMY  1948   Dr. [place]  . TUBAL LIGATION  1974  . WISDOM TOOTH EXTRACTION      No Known Allergies  Outpatient Encounter Medications as of 07/22/2020  Medication Sig  . acetaminophen (TYLENOL) 500 MG tablet Take 1,000 mg by mouth every 6 (six) hours as needed for headache.   Marland Kitchen aspirin 81 MG tablet Take 81 mg by mouth every morning.   Marland Kitchen atorvastatin (LIPITOR) 10 MG tablet Take 1 tablet (10 mg total) by mouth daily.  . chlorpheniramine-HYDROcodone (TUSSIONEX PENNKINETIC ER) 10-8 MG/5ML SUER Take 5 mLs by mouth at bedtime as needed for cough.  . gabapentin (NEURONTIN) 100 MG capsule Take 300 mg by mouth at bedtime.  Marland Kitchen levothyroxine (SYNTHROID) 88 MCG tablet Take 1 tablet (88 mcg total) by mouth daily.  Marland Kitchen LORazepam (ATIVAN) 1 MG tablet Take 1 tablet (1 mg total) by mouth every 8 (eight) hours as needed for anxiety.  . Magnesium 250 MG TABS Take 2 tablets by mouth daily.   . metroNIDAZOLE (FLAGYL) 500 MG tablet Take  1 tablet (500 mg total) by mouth 2 (two) times daily.  Marland Kitchen omeprazole (PRILOSEC) 40 MG capsule Take 1 capsule (40 mg total) by mouth daily.  . psyllium (HYDROCIL/METAMUCIL) 95 % PACK Take 1 packet by mouth daily.   Facility-Administered Encounter Medications as of 07/22/2020  Medication  . Romosozumab-aqqg (EVENITY) 105 MG/1.17ML injection 210 mg    Review of Systems:  Review of Systems  Constitutional: Negative for activity change, appetite change, chills and fever.  HENT: Negative for congestion.   Eyes: Negative.   Respiratory: Negative for cough and shortness of breath.   Gastrointestinal: Positive for abdominal pain and nausea. Negative for blood in stool and diarrhea.       Abdomina pain 3/10 Has small thin stool  Genitourinary: Negative for difficulty urinating.  Neurological: Negative.  Negative for  light-headedness.  Psychiatric/Behavioral: Negative.     Health Maintenance  Topic Date Due  . PNA vac Low Risk Adult (2 of 2 - PPSV23) 03/20/2014  . INFLUENZA VACCINE  01/24/2020  . MAMMOGRAM  04/21/2020  . TETANUS/TDAP  12/19/2028  . DEXA SCAN  Completed  . COVID-19 Vaccine  Completed    Physical Exam: Vitals:   07/22/20 1350  BP: (!) 160/70  Pulse: 79  Resp: 20  Temp: (!) 96.5 F (35.8 C)  TempSrc: Temporal  SpO2: 96%  Weight: 162 lb 9.6 oz (73.8 kg)  Height: 5\' 4"  (1.626 m)   Body mass index is 27.91 kg/m. Physical Exam Cardiovascular:     Rate and Rhythm: Normal rate and regular rhythm.     Pulses: Normal pulses.     Heart sounds: Normal heart sounds.  Abdominal:     General: Bowel sounds are normal. There is no distension.     Palpations: Abdomen is soft.     Comments: Abdomen is tender on LLQ  Musculoskeletal:        General: No swelling. Normal range of motion.     Cervical back: Normal range of motion.  Skin:    General: Skin is warm and dry.  Neurological:     General: No focal deficit present.     Mental Status: She is alert and oriented to person, place, and time.  Psychiatric:        Mood and Affect: Mood normal.        Behavior: Behavior normal.     Labs reviewed: Basic Metabolic Panel: Recent Labs    08/24/19 1538  NA 132*  K 4.6  CL 97*  CO2 25  GLUCOSE 86  BUN 13  CREATININE 0.78  CALCIUM 9.7  TSH 2.41   Liver Function Tests: Recent Labs    08/24/19 1538  AST 19  ALT 17  BILITOT 0.5  PROT 7.1   CBC: Recent Labs    08/24/19 1538  WBC 6.6  NEUTROABS 3,538  HGB 13.8  HCT 41.8  MCV 87.6  PLT 252   Lipid Panel:  Lab Results  Component Value Date   HGBA1C 5.5 08/24/2019     Assessment/Plan  1. Acute diverticulitis -  Will start on Cipro and Flagyl - ciprofloxacin (CIPRO) 500 MG tablet; Take 1 tablet (500 mg total) by mouth 2 (two) times daily for 10 days.  Dispense: 20 tablet; Refill: 0 - metroNIDAZOLE  (FLAGYL) 500 MG tablet; Take 1 tablet (500 mg total) by mouth 2 (two) times daily for 10 days.  Dispense: 20 tablet; Refill: 0 -  Instructed to de-stress and do regular exercise -  Instructed to call  the office or got to ER if symptoms get worse   Labs/tests ordered:  None  Next appt:  08/15/2020

## 2020-07-22 NOTE — Telephone Encounter (Signed)
Patient called and stated that [facility] did not have the Cipro that [facility] sent in.  Stated that her son is going by [facility] and wants to know if it could be sent there instead. Stated that they confirmed that they have the medication.  Rx sent to [facility] per patient's request.

## 2020-07-22 NOTE — Patient Instructions (Signed)
Diverticulitis  Diverticulitis is when small pouches in your colon (large intestine) get infected or swollen. This causes pain in the belly (abdomen) and watery poop (diarrhea). These pouches are called diverticula. The pouches form in people who have a condition called diverticulosis. What are the causes? This condition may be caused by poop (stool) that gets trapped in the pouches in your colon. The poop lets germs (bacteria) grow in the pouches. This causes the infection. What increases the risk? You are more likely to get this condition if you have small pouches in your colon. The risk is higher if:  You are overweight or very overweight (obese).  You do not exercise enough.  You drink alcohol.  You smoke or use products with tobacco in them.  You eat a diet that has a lot of red meat such as beef, pork, or lamb.  You eat a diet that does not have enough fiber in it.  You are older than 85 years of age. What are the signs or symptoms?  Pain in the belly. Pain is often on the left side, but it may be in other areas.  Fever and feeling cold.  Feeling like you may vomit.  Vomiting.  Having cramps.  Feeling full.  Changes to how often you poop.  Blood in your poop. How is this treated? Most cases are treated at home by:  Taking over-the-counter pain medicines.  Following a clear liquid diet.  Taking antibiotic medicines.  Resting. Very bad cases may need to be treated at a hospital. This may include:  Not eating or drinking.  Taking prescription pain medicine.  Getting antibiotic medicines through an IV tube.  Getting fluid and food through an IV tube.  Having surgery. When you are feeling better, your doctor may tell you to have a test to check your colon (colonoscopy). Follow these instructions at home: Medicines  Take over-the-counter and prescription medicines only as told by your doctor. These include: ? Antibiotics. ? Pain medicines. ? Fiber  pills. ? Probiotics. ? Stool softeners.  If you were prescribed an antibiotic medicine, take it as told by your doctor. Do not stop taking the antibiotic even if you start to feel better.  Ask your doctor if the medicine prescribed to you requires you to avoid driving or using machinery. Eating and drinking  Follow a diet as told by your doctor.  When you feel better, your doctor may tell you to change your diet. You may need to eat a lot of fiber. Fiber makes it easier to poop (have a bowel movement). Foods with fiber include: ? Berries. ? Beans. ? Lentils. ? Green vegetables.  Avoid eating red meat.   General instructions  Do not use any products that contain nicotine or tobacco, such as cigarettes, e-cigarettes, and chewing tobacco. If you need help quitting, ask your doctor.  Exercise 3 or more times a week. Try to get 30 minutes each time. Exercise enough to sweat and make your heart beat faster.  Keep all follow-up visits as told by your doctor. This is important. Contact a doctor if:  Your pain does not get better.  You are not pooping like normal. Get help right away if:  Your pain gets worse.  Your symptoms do not get better.  Your symptoms get worse very fast.  You have a fever.  You vomit more than one time.  You have poop that is: ? Bloody. ? Black. ? Tarry. Summary  This condition happens when   small pouches in your colon get infected or swollen.  Take medicines only as told by your doctor.  Follow a diet as told by your doctor.  Keep all follow-up visits as told by your doctor. This is important. This information is not intended to replace advice given to you by your health care provider. Make sure you discuss any questions you have with your health care provider. Document Revised: 03/23/2019 Document Reviewed: 03/23/2019 Elsevier Patient Education  2021 Elsevier Inc.  

## 2020-07-27 ENCOUNTER — Encounter (INDEPENDENT_AMBULATORY_CARE_PROVIDER_SITE_OTHER): Payer: Medicare HMO | Admitting: Ophthalmology

## 2020-08-03 ENCOUNTER — Other Ambulatory Visit: Payer: Self-pay

## 2020-08-03 ENCOUNTER — Ambulatory Visit (HOSPITAL_COMMUNITY)
Admission: RE | Admit: 2020-08-03 | Discharge: 2020-08-03 | Disposition: A | Discharge status: Home / Self Care | Payer: Medicare HMO | Source: Ambulatory Visit | Attending: Internal Medicine | Admitting: Internal Medicine

## 2020-08-03 DIAGNOSIS — Z1231 Encounter for screening mammogram for malignant neoplasm of breast: Secondary | ICD-10-CM | POA: Diagnosis present

## 2020-08-08 ENCOUNTER — Ambulatory Visit: Payer: Medicare HMO | Admitting: Internal Medicine

## 2020-08-15 ENCOUNTER — Encounter: Payer: Self-pay | Admitting: Internal Medicine

## 2020-08-15 ENCOUNTER — Ambulatory Visit (INDEPENDENT_AMBULATORY_CARE_PROVIDER_SITE_OTHER): Payer: Medicare HMO | Admitting: Internal Medicine

## 2020-08-15 ENCOUNTER — Other Ambulatory Visit: Payer: Self-pay

## 2020-08-15 VITALS — BP 130/80 | HR 76 | Temp 97.7°F | Ht 64.0 in | Wt 159.2 lb

## 2020-08-15 DIAGNOSIS — K581 Irritable bowel syndrome with constipation: Secondary | ICD-10-CM | POA: Diagnosis not present

## 2020-08-15 DIAGNOSIS — R053 Chronic cough: Secondary | ICD-10-CM

## 2020-08-15 DIAGNOSIS — R600 Localized edema: Secondary | ICD-10-CM

## 2020-08-15 DIAGNOSIS — M810 Age-related osteoporosis without current pathological fracture: Secondary | ICD-10-CM

## 2020-08-15 DIAGNOSIS — K5790 Diverticulosis of intestine, part unspecified, without perforation or abscess without bleeding: Secondary | ICD-10-CM

## 2020-08-15 DIAGNOSIS — R739 Hyperglycemia, unspecified: Secondary | ICD-10-CM | POA: Diagnosis not present

## 2020-08-15 DIAGNOSIS — I10 Essential (primary) hypertension: Secondary | ICD-10-CM

## 2020-08-15 MED ORDER — ROMOSOZUMAB-AQQG 105 MG/1.17ML ~~LOC~~ SOSY
105.0000 mg | PREFILLED_SYRINGE | Freq: Once | SUBCUTANEOUS | Status: AC
Start: 2020-08-15 — End: 2020-08-15
  Administered 2020-08-15: 105 mg via SUBCUTANEOUS

## 2020-08-15 NOTE — Patient Instructions (Addendum)
Increase your gabapentin to 400mg  nightly for a week and see if it helps your neuropathy and your cough.    Please check blood pressure about an hour after morning medications.  Keep track and send them after 2 weeks.

## 2020-08-15 NOTE — Progress Notes (Signed)
Location:  [facility] clinic Provider:  Ryleeann Urquiza L. Mariea Clonts, D.O., C.M.D.  Goals of Care:  Advanced Directives 08/15/2020  Does Patient Have a Medical Advance Directive? Yes  Type of Paramedic of [place];Living will  Does patient want to make changes to medical advance directive? No - Patient declined  Copy of [place] in Chart? Yes - validated most recent copy scanned in chart (See row information)  Would patient like information on creating a medical advance directive? -  Pre-existing out of facility DNR order (yellow form or pink MOST form) -   Chief Complaint  Patient presents with  . Medical Management of Chronic Issues    4 month follow up and evenity injection. Patient also has questions about blood work. Patient having numbness in feet. Patient's blood pressure seems to be trending up. Patient states that ENT gave her gabapentin and Tramsdol for her cough and it helped. She was told to stop taking the tramadol and since stopping Tramadol she has started coughing again.Patient would like to discuss diverticulitis. Discuss need for Pneumonia vaccine    HPI: Patient is a 87 y.o. female seen today for medical management of chronic diseases.    Feeling fairly well.  She fasted in hopes of labs today.  Had two bouts of diverticulitis since November and it had been years before that since she had a flare.  She used to have them often.  Had gone to urgent care in [place] for the spell in Nov.  Treated with cipro/flagyl x 7 days.  It was not entirely well afterward and her BMs were not right and she'd gotten constipated.  She called them back and took miralax and did have bm.  She never felt right afterward.  She called here in Jan and was seen by [facility] 1/28 and given 10 day supply of cipro and flagyl and that cleared it up.  She gets fever and feels so sick--temp was up to 102!  She usually runs low at baseline.      Her daughter is concerned about Lu  having the right foods to easily eat while she is gone at work.   Still has problems related to estate she is settling.  She is a Research officer, trade union.  Lives in her daughter's basement.    Gets her evenity today.    She went back to the ENT re: her cough--had not been coughing for over a month.  Was taking one gabapentin and increase until cough controlled--was up to 3 and still coughing so added 1/2 tramadol to stop cough.  It worked.  When she left off the 1/2 tramadol, her cough returned.  hydrocodone syrup made her drowsy the next day.  Neuropathy in her feet bothers her at night.  When she takes her socks and shoes off and gets into the bed, even though bed heated, her feet like they are freezing  cold. They are physically warm to touch. Numbness is there at night.  When she puts on compression stockings (even mild) she gets bruises--once they were on, they felt good.    BPs are running up in the 160s at home.    Past Medical History:  Diagnosis Date  . Anxiety   . Chest pain 02/08/15   ETT normal  . Diverticulosis    Diverticulitis (1982)  . Fracture of rib of right side 08/16/2013  . GERD (gastroesophageal reflux disease)   . Hyperlipidemia, mixed   . Hypothyroidism   . IBS (  irritable bowel syndrome)   . Morton's neuroma   . Right lumbar radiculopathy    Intermittent x 2 episodes in summer 2014  . Shingles 1967 and 2010  . Ulcer   . Urine incontinence   . Vertigo     Past Surgical History:  Procedure Laterality Date  . CARDIOVASCULAR STRESS TEST  02/08/15   ETT normal  . CATARACT EXTRACTION  2012   Dr. Herbert Deaner  . COLONOSCOPY  2005   [facility]. Arsenio Loader at [facility] (normal per pt report)  . DEXA  11/03/13   Heel T score -0.8.  Marland Kitchen [place]   right foot  . TONSILLECTOMY    . TONSILLECTOMY AND ADENOIDECTOMY  1948   Dr. [place]  . TUBAL LIGATION  1974  . WISDOM TOOTH EXTRACTION      No Known Allergies  Outpatient Encounter Medications as of 08/15/2020   Medication Sig  . acetaminophen (TYLENOL) 500 MG tablet Take 1,000 mg by mouth every 6 (six) hours as needed for headache.   Marland Kitchen aspirin 81 MG tablet Take 81 mg by mouth every morning.   Marland Kitchen atorvastatin (LIPITOR) 10 MG tablet Take 1 tablet (10 mg total) by mouth daily.  . chlorpheniramine-HYDROcodone (TUSSIONEX PENNKINETIC ER) 10-8 MG/5ML SUER Take 5 mLs by mouth at bedtime as needed for cough.  . gabapentin (NEURONTIN) 100 MG capsule Take 300 mg by mouth at bedtime.  Marland Kitchen levothyroxine (SYNTHROID) 88 MCG tablet Take 1 tablet (88 mcg total) by mouth daily.  Marland Kitchen LORazepam (ATIVAN) 1 MG tablet Take 1 tablet (1 mg total) by mouth every 8 (eight) hours as needed for anxiety.  . Magnesium 250 MG TABS Take 2 tablets by mouth daily.   Marland Kitchen omeprazole (PRILOSEC) 40 MG capsule Take 1 capsule (40 mg total) by mouth daily.  . psyllium (HYDROCIL/METAMUCIL) 95 % PACK Take 1 packet by mouth daily.  . traMADol (ULTRAM) 50 MG tablet Take 50 mg by mouth daily. Take one half to one tablet for cough if needed along with gabapentin  . [DISCONTINUED] metroNIDAZOLE (FLAGYL) 500 MG tablet Take 1 tablet (500 mg total) by mouth 2 (two) times daily.   Facility-Administered Encounter Medications as of 08/15/2020  Medication  . Romosozumab-aqqg (EVENITY) 105 MG/1.17ML injection 210 mg    Review of Systems:  Review of Systems  Constitutional: Negative for chills, fever and malaise/fatigue.  HENT: Negative for congestion and sore throat.   Eyes: Negative for blurred vision.       Due to see Dr. Zigmund Daniel  Respiratory: Positive for cough. Negative for shortness of breath.   Cardiovascular: Negative for chest pain, palpitations and leg swelling.       No current edema  Gastrointestinal: Negative for abdominal pain.       Had two episodes of diverticulitis since last time  Genitourinary: Negative for dysuria.  Musculoskeletal: Negative for back pain, falls and joint pain.       Walks with quad cane  Skin: Negative for itching  and rash.  Neurological: Negative for dizziness and loss of consciousness.  Endo/Heme/Allergies: Bruises/bleeds easily.  Psychiatric/Behavioral: Negative for depression and memory loss. The patient is nervous/anxious. The patient does not have insomnia.     Health Maintenance  Topic Date Due  . PNA vac Low Risk Adult (2 of 2 - PPSV23) 03/20/2014  . MAMMOGRAM  08/03/2021  . TETANUS/TDAP  12/19/2028  . INFLUENZA VACCINE  Completed  . DEXA SCAN  Completed  . COVID-19 Vaccine  Completed    Physical Exam: Vitals:   08/15/20 1146  BP: 130/80  Pulse: 76  Temp: 97.7 F (36.5 C)  SpO2: 98%  Weight: 159 lb 3.2 oz (72.2 kg)  Height: 5\' 4"  (1.626 m)   Body mass index is 27.33 kg/m. Physical Exam Vitals reviewed.  Constitutional:      Appearance: Normal appearance.  HENT:     Head: Normocephalic and atraumatic.  Eyes:     Extraocular Movements: Extraocular movements intact.     Conjunctiva/sclera: Conjunctivae normal.     Pupils: Pupils are equal, round, and reactive to light.     Comments: glasses  Cardiovascular:     Rate and Rhythm: Normal rate and regular rhythm.     Pulses: Normal pulses.     Heart sounds: Normal heart sounds.  Pulmonary:     Effort: Pulmonary effort is normal.     Breath sounds: No wheezing, rhonchi or rales.  Abdominal:     General: Bowel sounds are normal. There is no distension.     Palpations: Abdomen is soft.     Tenderness: There is no abdominal tenderness. There is no guarding or rebound.  Musculoskeletal:        General: Normal range of motion.     Right lower leg: No edema.     Left lower leg: No edema.  Skin:    General: Skin is warm and dry.  Neurological:     General: No focal deficit present.     Mental Status: She is alert and oriented to person, place, and time.     Motor: No weakness.     Gait: Gait abnormal.     Comments: Uses quad cane  Psychiatric:        Mood and Affect: Mood normal.        Behavior: Behavior normal.         Thought Content: Thought content normal.        Judgment: Judgment normal.     Labs reviewed: Basic Metabolic Panel: Recent Labs    08/24/19 1538  NA 132*  K 4.6  CL 97*  CO2 25  GLUCOSE 86  BUN 13  CREATININE 0.78  CALCIUM 9.7  TSH 2.41   Liver Function Tests: Recent Labs    08/24/19 1538  AST 19  ALT 17  BILITOT 0.5  PROT 7.1   No results for input(s): LIPASE, AMYLASE in the last 8760 hours. No results for input(s): AMMONIA in the last 8760 hours. CBC: Recent Labs    08/24/19 1538  WBC 6.6  NEUTROABS 3,538  HGB 13.8  HCT 41.8  MCV 87.6  PLT 252   Lipid Panel: No results for input(s): CHOL, HDL, LDLCALC, TRIG, CHOLHDL, LDLDIRECT in the last 8760 hours. Lab Results  Component Value Date   HGBA1C 5.5 08/24/2019    Procedures since last visit: MM 3D SCREEN BREAST BILATERAL  Result Date: 08/08/2020 CLINICAL DATA:  Screening. EXAM: DIGITAL SCREENING BILATERAL MAMMOGRAM WITH TOMOSYNTHESIS AND CAD TECHNIQUE: Bilateral screening digital craniocaudal and mediolateral oblique mammograms were obtained. Bilateral screening digital breast tomosynthesis was performed. The images were evaluated with computer-aided detection. COMPARISON:  Previous exam(s). ACR Breast Density Category b: There are scattered areas of fibroglandular density. FINDINGS: There are no findings suspicious for malignancy. IMPRESSION: No mammographic evidence of malignancy. A result letter of this screening mammogram will be mailed directly to the patient. RECOMMENDATION: Screening mammogram in one year. (Code:SM-B-01Y) BI-RADS CATEGORY  1: Negative. Electronically Signed   By:  Margarette Canada M.D.   On: 08/08/2020 08:32    Assessment/Plan 1. Senile osteoporosis - Romosozumab-aqqg (EVENITY) 105 MG/1.17ML injection 105 mg -cont vitamin D and weightbearing exercise with walking - COMPLETE METABOLIC PANEL WITH GFR  2. Diverticular disease - needs guidance on best diet to follow to help prevent this  and also simple things she can prepare when her daughter is at work - Amb ref to Medical Nutrition Therapy-MNT - CBC with Differential/Platelet - COMPLETE METABOLIC PANEL WITH GFR  3. Irritable bowel syndrome with constipation - doing ok here now that two episodes of diverticulitis and constipation episode better - COMPLETE METABOLIC PANEL WITH GFR  4. Hyperglycemia -f/u lab today - Hemoglobin A1c  5. Chronic cough -increase gabapentin to 400mg  nightly -hydrocodone syrup works but makes her drowsy  6. Edema of both lower extremities -could not apply even mild compression hose w/o bruising, does not have edema today  7.  Hypertension -pt has had high readings with autocuffs at other office appts and last two here (today's at goal), so she her daughter will help check her bp daily one hour after meds and send results after 2 wks via mychart  Labs/tests ordered:   Lab Orders     CBC with Differential/Platelet     COMPLETE METABOLIC PANEL WITH GFR     Hemoglobin A1c Done today  Next appt:  4 mos with Amy, NP   Ardis Lawley L. Vineeth Fell, D.O. [facility] [address], [place] [address] Cell Phone (Mon-Fri 8am-5pm):  [phone] On Call:  [phone] & follow prompts after 5pm & weekends Office Phone:  [phone] Office Fax:  [phone]

## 2020-08-16 LAB — COMPLETE METABOLIC PANEL WITH GFR
AG Ratio: 1.4 (calc) (ref 1.0–2.5)
ALT: 13 U/L (ref 6–29)
AST: 15 U/L (ref 10–35)
Albumin: 4.1 g/dL (ref 3.6–5.1)
Alkaline phosphatase (APISO): 91 U/L (ref 37–153)
BUN: 14 mg/dL (ref 7–25)
CO2: 27 mmol/L (ref 20–32)
Calcium: 9.4 mg/dL (ref 8.6–10.4)
Chloride: 103 mmol/L (ref 98–110)
Creat: 0.76 mg/dL (ref 0.60–0.88)
GFR, Est African American: 83 mL/min/{1.73_m2} (ref >=60)
GFR, Est Non African American: 72 mL/min/{1.73_m2} (ref >=60)
Globulin: 2.9 g/dL (calc) (ref 1.9–3.7)
Glucose, Bld: 98 mg/dL (ref 65–99)
Potassium: 5.2 mmol/L (ref 3.5–5.3)
Sodium: 137 mmol/L (ref 135–146)
Total Bilirubin: 0.5 mg/dL (ref 0.2–1.2)
Total Protein: 7 g/dL (ref 6.1–8.1)

## 2020-08-16 LAB — CBC WITH DIFFERENTIAL/PLATELET
Absolute Monocytes: 504 cells/uL (ref 200–950)
Basophils Absolute: 38 cells/uL (ref 0–200)
Basophils Relative: 0.6 %
Eosinophils Absolute: 662 cells/uL — ABNORMAL HIGH (ref 15–500)
Eosinophils Relative: 10.5 %
HCT: 43.2 % (ref 35.0–45.0)
Hemoglobin: 14.2 g/dL (ref 11.7–15.5)
Lymphs Abs: 1651 cells/uL (ref 850–3900)
MCH: 29.5 pg (ref 27.0–33.0)
MCHC: 32.9 g/dL (ref 32.0–36.0)
MCV: 89.6 fL (ref 80.0–100.0)
MPV: 10.5 fL (ref 7.5–12.5)
Monocytes Relative: 8 %
Neutro Abs: 3446 cells/uL (ref 1500–7800)
Neutrophils Relative %: 54.7 %
Platelets: 277 10*3/uL (ref 140–400)
RBC: 4.82 10*6/uL (ref 3.80–5.10)
RDW: 12.6 % (ref 11.0–15.0)
Total Lymphocyte: 26.2 %
WBC: 6.3 10*3/uL (ref 3.8–10.8)

## 2020-08-16 LAB — HEMOGLOBIN A1C
Hgb A1c MFr Bld: 5.6 % of total Hgb (ref <5.7)
Mean Plasma Glucose: 114 mg/dL
eAG (mmol/L): 6.3 mmol/L

## 2020-08-16 NOTE — Progress Notes (Signed)
Labs are overall ok including blood counts, liver, kidneys, electrolytes.  Sugar trended up just a little, but still in normal range.    Of note, eosinophils run high which often suggests some allergy component.   Can we check [facility] to see which pneumonia vaccine she actually had in 2014?  Had at the [place] so it should be in there. It's documented as three different things and need to know if she needs another one at all

## 2020-08-24 ENCOUNTER — Other Ambulatory Visit: Payer: Self-pay

## 2020-08-24 ENCOUNTER — Encounter (INDEPENDENT_AMBULATORY_CARE_PROVIDER_SITE_OTHER): Payer: Medicare HMO | Admitting: Ophthalmology

## 2020-08-24 DIAGNOSIS — D3131 Benign neoplasm of right choroid: Secondary | ICD-10-CM | POA: Diagnosis not present

## 2020-08-24 DIAGNOSIS — H43813 Vitreous degeneration, bilateral: Secondary | ICD-10-CM | POA: Diagnosis not present

## 2020-09-13 ENCOUNTER — Other Ambulatory Visit: Payer: Self-pay

## 2020-09-13 ENCOUNTER — Ambulatory Visit: Payer: Medicare HMO

## 2020-09-13 ENCOUNTER — Telehealth: Payer: Self-pay | Admitting: Internal Medicine

## 2020-09-13 ENCOUNTER — Ambulatory Visit: Payer: Medicare HMO | Admitting: *Deleted

## 2020-09-13 DIAGNOSIS — M810 Age-related osteoporosis without current pathological fracture: Secondary | ICD-10-CM

## 2020-09-13 MED ORDER — ROMOSOZUMAB-AQQG 105 MG/1.17ML ~~LOC~~ SOSY
210.0000 mg | PREFILLED_SYRINGE | Freq: Once | SUBCUTANEOUS | Status: AC
  Administered 2020-09-13: 210 mg via SUBCUTANEOUS

## 2020-09-13 NOTE — Telephone Encounter (Signed)
Pt recieved last Evenity(#12) today 09/13/20 & would like to start prolia.. What is the "wait" time on scheduling after last Evenity to prolia?  Thanks, Vilinda Blanks.

## 2020-09-13 NOTE — Telephone Encounter (Signed)
She can get prolia the next month after she finished evenity and then every 6 mos thereafter.

## 2020-09-14 ENCOUNTER — Ambulatory Visit: Payer: Medicare HMO

## 2020-09-16 ENCOUNTER — Ambulatory Visit: Payer: Medicare HMO | Admitting: Dietician

## 2020-09-20 ENCOUNTER — Other Ambulatory Visit: Payer: Self-pay | Admitting: *Deleted

## 2020-09-20 MED ORDER — LORAZEPAM 1 MG PO TABS: 1.0000 mg | ORAL_TABLET | Freq: Three times a day (TID) | ORAL | 1 refills | Status: DC | PRN

## 2020-09-20 NOTE — Telephone Encounter (Signed)
Pharmacy requested refill Epic LR: 03/31/2020 Contract on [facility] and sent to Amy for approval.

## 2020-09-23 ENCOUNTER — Ambulatory Visit: Payer: Medicare HMO | Admitting: Dietician

## 2020-09-26 ENCOUNTER — Other Ambulatory Visit: Payer: Self-pay

## 2020-09-26 ENCOUNTER — Ambulatory Visit (INDEPENDENT_AMBULATORY_CARE_PROVIDER_SITE_OTHER): Payer: Medicare HMO | Admitting: Family

## 2020-09-26 ENCOUNTER — Encounter: Payer: Self-pay | Admitting: Family

## 2020-09-26 VITALS — BP 130/70 | HR 78 | Temp 97.1°F | Resp 16 | Ht 64.0 in | Wt 162.0 lb

## 2020-09-26 DIAGNOSIS — B372 Candidiasis of skin and nail: Secondary | ICD-10-CM

## 2020-09-26 MED ORDER — NYSTATIN 100000 UNIT/GM EX CREA: 1.0000 "application " | TOPICAL_CREAM | Freq: Two times a day (BID) | CUTANEOUS | 0 refills | Status: DC

## 2020-09-26 NOTE — Patient Instructions (Signed)

## 2020-09-26 NOTE — Progress Notes (Signed)
Provider: Syndey Jaskolski FNP-C  Yvonna Alanis, NP  Patient Care Team: Yvonna Alanis, NP as PCP - General (Adult Health Nurse Practitioner) Monna Fam, MD as Consulting Physician (Ophthalmology) Gerarda Fraction, MD as Referring Physician (Ophthalmology) Carole Civil, MD as Consulting Physician (Orthopedic Surgery)  Extended Emergency Contact Information Primary Emergency Contact: Terry,Tish Address: [address]          [place], [place] [address] Johnnette Litter of [place] Phone: [phone] Work Phone: [phone] Mobile Phone: [phone] Relation: Daughter  Code Status:Full Code  Goals of care: Advanced Directive information Advanced Directives 09/26/2020  Does Patient Have a Medical Advance Directive? Yes  Type of Paramedic of [place];Living will  Does patient want to make changes to medical advance directive? No - Patient declined  Copy of [place] in Chart? Yes - validated most recent copy scanned in chart (See row information)  Would patient like information on creating a medical advance directive? -  Pre-existing out of facility DNR order (yellow form or pink MOST form) -     Chief Complaint  Patient presents with  . Acute Visit    Possible yeast infection under breast.     HPI:  Pt is a 87 y.o. female seen today for an acute visit for evaluation of rash under left breast x 4 days.she is here with her daughter who provides additional HPI information.Itchy with strong odor.has small pustules for the past couple days.skin has been red.Has used Talcum powder to keep it dry.rash has been itchy and skin broke along the breast fold described as painful.   Past Medical History:  Diagnosis Date  . Anxiety   . Chest pain 02/08/15   ETT normal  . Diverticulosis    Diverticulitis (1982)  . Fracture of rib of right side 08/16/2013  . GERD (gastroesophageal reflux disease)   . Hyperlipidemia, mixed   . Hypothyroidism    . IBS (irritable bowel syndrome)   . Morton's neuroma   . Right lumbar radiculopathy    Intermittent x 2 episodes in summer 2014  . Shingles 1967 and 2010  . Ulcer   . Urine incontinence   . Vertigo    Past Surgical History:  Procedure Laterality Date  . CARDIOVASCULAR STRESS TEST  02/08/15   ETT normal  . CATARACT EXTRACTION  2012   Dr. Herbert Deaner  . COLONOSCOPY  2005   Dr. Arsenio Loader at [facility] (normal per pt report)  . DEXA  11/03/13   Heel T score -0.8.  Marland Kitchen [place]   right foot  . TONSILLECTOMY    . TONSILLECTOMY AND ADENOIDECTOMY  1948   Dr. [place]  . TUBAL LIGATION  1974  . WISDOM TOOTH EXTRACTION      No Known Allergies  Outpatient Encounter Medications as of 09/26/2020  Medication Sig  . acetaminophen (TYLENOL) 500 MG tablet Take 1,000 mg by mouth every 6 (six) hours as needed for headache.   Marland Kitchen aspirin 81 MG tablet Take 81 mg by mouth every morning.   Marland Kitchen atorvastatin (LIPITOR) 10 MG tablet Take 1 tablet (10 mg total) by mouth daily.  Marland Kitchen gabapentin (NEURONTIN) 100 MG capsule Take 300 mg by mouth at bedtime.  Marland Kitchen levothyroxine (SYNTHROID) 88 MCG tablet Take 1 tablet (88 mcg total) by mouth daily.  Marland Kitchen LORazepam (ATIVAN) 1 MG tablet Take 1 tablet (1 mg total) by mouth every 8 (eight) hours as needed for anxiety.  Marland Kitchen  Magnesium 250 MG TABS Take 2 tablets by mouth daily.   Marland Kitchen omeprazole (PRILOSEC) 40 MG capsule Take 1 capsule (40 mg total) by mouth daily.  . psyllium (HYDROCIL/METAMUCIL) 95 % PACK Take 1 packet by mouth daily.  . traMADol (ULTRAM) 50 MG tablet Take 25 mg by mouth daily.   Facility-Administered Encounter Medications as of 09/26/2020  Medication  . Romosozumab-aqqg (EVENITY) 105 MG/1.17ML injection 210 mg    Review of Systems  Constitutional: Negative for appetite change, chills, fatigue and fever.  Respiratory: Negative for cough, chest tightness, shortness of breath and wheezing.   Cardiovascular: Negative for chest pain,  palpitations and leg swelling.  Gastrointestinal: Negative for abdominal distention, abdominal pain, constipation, diarrhea, nausea and vomiting.  Genitourinary: Negative for difficulty urinating, dysuria, flank pain, frequency and urgency.  Skin: Positive for color change. Negative for pallor and rash.       Redness under the breast     Immunization History  Administered Date(s) Administered  . Influenza Split 02/24/2012  . Influenza, High Dose Seasonal PF 04/29/2018, 04/24/2019  . Influenza,inj,Quad PF,6+ Mos 02/23/2013  . Influenza-Unspecified 03/08/2015, 03/26/2017, 05/14/2020  . PFIZER(Purple Top)SARS-COV-2 Vaccination 07/15/2019, 08/14/2019, 04/14/2020  . Pneumococcal Conjugate-13 03/25/1996, 03/20/2013  . Pneumococcal Polysaccharide-23 04/16/1996, 03/20/2013  . Pneumococcal-Unspecified 03/20/2013  . Td 12/20/2018  . Tdap 09/17/2013  . Zoster 08/23/2012   Pertinent  Health Maintenance Due  Topic Date Due  . PNA vac Low Risk Adult (2 of 2 - PPSV23) 03/20/2014  . INFLUENZA VACCINE  01/23/2021  . MAMMOGRAM  08/03/2021  . DEXA SCAN  Completed   Fall Risk  09/26/2020 07/22/2020 04/07/2020 12/01/2019 10/29/2019  Falls in the past year? 0 0 - 1 0  Number falls in past yr: 0 0 0 0 0  Comment - - - 1 -  Injury with Fall? 0 0 0 1 0  Comment - - - Scrapes, bruises -   Functional Status Survey:    Vitals:   09/26/20 1421  BP: 130/70  Pulse: 78  Resp: 16  Temp: (!) 97.1 F (36.2 C)  SpO2: 98%  Weight: 162 lb (73.5 kg)  Height: 5\' 4"  (1.626 m)   Body mass index is 27.81 kg/m. Physical Exam Vitals reviewed.  Constitutional:      General: She is not in acute distress.    Appearance: She is overweight. She is not ill-appearing.  HENT:     Head: Normocephalic.     Mouth/Throat:     Mouth: Mucous membranes are moist.     Pharynx: Oropharynx is clear. No oropharyngeal exudate or posterior oropharyngeal erythema.  Eyes:     General: No scleral icterus.       Right eye: No  discharge.        Left eye: No discharge.     Extraocular Movements: Extraocular movements intact.     Conjunctiva/sclera: Conjunctivae normal.     Pupils: Pupils are equal, round, and reactive to light.  Cardiovascular:     Rate and Rhythm: Normal rate and regular rhythm.     Pulses: Normal pulses.     Heart sounds: Normal heart sounds. No murmur heard. No friction rub. No gallop.   Pulmonary:     Effort: Pulmonary effort is normal. No respiratory distress.     Breath sounds: Normal breath sounds. No wheezing, rhonchi or rales.  Chest:     Chest wall: No tenderness.    Abdominal:     General: Bowel sounds are normal. There is no distension.  Palpations: Abdomen is soft. There is no mass.     Tenderness: There is no abdominal tenderness. There is no right CVA tenderness, left CVA tenderness, guarding or rebound.  Skin:    General: Skin is warm and dry.     Coloration: Skin is not pale.     Findings: Erythema present. No bruising, lesion or rash.     Comments: Beefy red erythema on left breast fold with linear fissure noted tender to touch.No signs of infection noted.   Neurological:     Mental Status: She is alert and oriented to person, place, and time.     Cranial Nerves: No cranial nerve deficit.     Motor: No weakness.     Gait: Gait abnormal.  Psychiatric:        Mood and Affect: Mood normal.        Behavior: Behavior normal.        Thought Content: Thought content normal.        Judgment: Judgment normal.     Labs reviewed: Recent Labs    08/15/20 1236  NA 137  K 5.2  CL 103  CO2 27  GLUCOSE 98  BUN 14  CREATININE 0.76  CALCIUM 9.4   Recent Labs    08/15/20 1236  AST 15  ALT 13  BILITOT 0.5  PROT 7.0   Recent Labs    08/15/20 1236  WBC 6.3  NEUTROABS 3,446  HGB 14.2  HCT 43.2  MCV 89.6  PLT 277   Lab Results  Component Value Date   TSH 2.41 08/24/2019   Lab Results  Component Value Date   HGBA1C 5.6 08/15/2020   Lab Results   Component Value Date   CHOL 152 11/04/2014   HDL 45.80 11/04/2014   LDLCALC 81 11/04/2014   TRIG 128.0 11/04/2014   CHOLHDL 3 11/04/2014    Significant Diagnostic Results in last 30 days:  No results found.  Assessment/Plan   Candidiasis of skin Afebrile. Beefy red erythema on left breast fold with linear fissure noted tender to touch.No signs of infection noted.  - Advised to dry skin folds well after showers  - encouraged to wear bra to prevent skin to skin contact moisture - apply Nystatin as below and avoid Talcum powder which might worsen skin moisture after awhile.  - nystatin cream (MYCOSTATIN); Apply 1 application topically 2 (two) times daily. To left breast fold  Dispense: 30 g; Refill: 0 - Notify provider if symptoms worsen   Family/ staff Communication: Reviewed plan of care with patient and daughter verbalized understanding.  Labs/tests ordered: None   Next Appointment: As needed if symptoms worsen or fail to improve   Sandrea Hughs, NP

## 2020-10-05 ENCOUNTER — Ambulatory Visit: Payer: Medicare HMO | Admitting: Nutrition

## 2020-10-19 ENCOUNTER — Encounter: Payer: Medicare HMO | Attending: Internal Medicine | Admitting: Nutrition

## 2020-10-19 ENCOUNTER — Other Ambulatory Visit: Payer: Self-pay

## 2020-10-19 ENCOUNTER — Encounter: Payer: Self-pay | Admitting: Nutrition

## 2020-10-19 VITALS — Ht 65.0 in | Wt 159.0 lb

## 2020-10-19 DIAGNOSIS — K589 Irritable bowel syndrome without diarrhea: Secondary | ICD-10-CM | POA: Insufficient documentation

## 2020-10-19 DIAGNOSIS — K5790 Diverticulosis of intestine, part unspecified, without perforation or abscess without bleeding: Secondary | ICD-10-CM

## 2020-10-19 NOTE — Patient Instructions (Addendum)
Goals  Gradually increase fiber Drink 64 oz of water or more Avoid nuts, seeds, corn and foods with seeds in them; strawberries, berries Continue to eat [place] daily. Keep doing chair exercises Eat small frequent meals as needed. Avoid constipation.

## 2020-10-19 NOTE — Progress Notes (Signed)
Medical Nutrition Therapy  Appointment Start time:  1400  Appointment End time: 1500 Primary concerns today: IBS/Diverticuliti  Referral diagnosis: K90.0 Preferred learning style:  no preference indicated Learning readiness:  change in progress   NUTRITION ASSESSMENT   Anthropometrics  Wt Readings from Last 3 Encounters:  10/19/20 159 lb (72.1 kg)  09/26/20 162 lb (73.5 kg)  08/15/20 159 lb 3.2 oz (72.2 kg)   Ht Readings from Last 3 Encounters:  10/19/20 5\' 5"  (1.651 m)  09/26/20 5\' 4"  (1.626 m)  08/15/20 5\' 4"  (1.626 m)   Body mass index is 26.46 kg/m. @BMIFA @ Facility age limit for growth percentiles is 20 years. Facility age limit for growth percentiles is 20 years.   Clinical Medical Hx: IBS/Diverticulitis Medications: see chart Labs:  Notable Signs/Symptoms: Had a glare up in diverticulitis and she thinks it was because of some nuts she was eating in cereal.  Lifestyle & Dietary Hx She currently lives with her daughter after a few falls. Usually uses a cane but forgot it today. Eating more consistent and balanced meals at her daughters house. Tries to avoid corn and other trigger foods.   One of her daughters is a NP and the other is a Marine scientist.  Estimated daily fluid intake: 40 oz Supplements: Mg, Metamucil Sleep: 8 hrs  Stress / self-care: none Current average weekly physical activity: ADL  24-Hr Dietary Recall First Meal: Fruit and nuts cereal-has stopped eating this now and eating cherrios or oatmeal. Snack:  Second Meal: meat and vegetables, fruit Snack:  Third Meal: Meat and vegetables her daughter or son in law cooks. Meals are very healthy she reports. Snack: fruit Beverages: water  Estimated Energy Needs Calories: 1800 Carbohydrate: 200g Protein: 100g Fat: 50g   NUTRITION DIAGNOSIS  [place]-1.4 Altered GI function As related to Diverticulitis .  As evidenced by gas, bloating, pain, diarrhea and flareup requiring antibiotics.Marland Kitchen   NUTRITION  INTERVENTION  Nutrition education (E-1) on the following topics:  . IBS and Diverticulitis . Ways to increase fiber  Handouts Provided Include   IBS/Diverticulitis nutrition  Low fiber and increasing fiber in diet as tolerated  Ways to increase fiber  Goals  Gradually increase fiber Drink 64 oz of water or more Avoid nuts, seeds, corn and foods with seeds in them; strawberries, berries Continue to eat [place] daily. Keep doing chair exercises Eat small frequent meals as needed. Avoid constipation.   Learning Style & Readiness for Change Teaching method utilized: Visual & Auditory  Demonstrated degree of understanding via: Teach Back  Barriers to learning/adherence to lifestyle change: non3  Goals Established by Pt . Follow low residue diet and increase fiber as tolerated. . Avoid nuts, seeds,    MONITORING & EVALUATION Monitor GI issues and notify PCP or GI MD.

## 2020-11-10 ENCOUNTER — Encounter: Payer: Self-pay | Admitting: Adult Health

## 2020-11-10 ENCOUNTER — Other Ambulatory Visit: Payer: Self-pay

## 2020-11-10 ENCOUNTER — Telehealth (INDEPENDENT_AMBULATORY_CARE_PROVIDER_SITE_OTHER): Payer: Medicare HMO | Admitting: Adult Health

## 2020-11-10 DIAGNOSIS — K5792 Diverticulitis of intestine, part unspecified, without perforation or abscess without bleeding: Secondary | ICD-10-CM

## 2020-11-10 MED ORDER — METRONIDAZOLE 500 MG PO TABS: 500.0000 mg | ORAL_TABLET | Freq: Two times a day (BID) | ORAL | 0 refills | Status: AC

## 2020-11-10 MED ORDER — CIPROFLOXACIN HCL 500 MG PO TABS
500.0000 mg | ORAL_TABLET | Freq: Two times a day (BID) | ORAL | 0 refills | Status: AC
Start: 2020-11-10 — End: 2020-11-20

## 2020-11-10 NOTE — Patient Instructions (Signed)
Diverticulitis  Diverticulitis is when small pouches in your colon (large intestine) get infected or swollen. This causes pain in the belly (abdomen) and watery poop (diarrhea). These pouches are called diverticula. The pouches form in people who have a condition called diverticulosis. What are the causes? This condition may be caused by poop (stool) that gets trapped in the pouches in your colon. The poop lets germs (bacteria) grow in the pouches. This causes the infection. What increases the risk? You are more likely to get this condition if you have small pouches in your colon. The risk is higher if:  You are overweight or very overweight (obese).  You do not exercise enough.  You drink alcohol.  You smoke or use products with tobacco in them.  You eat a diet that has a lot of red meat such as beef, pork, or lamb.  You eat a diet that does not have enough fiber in it.  You are older than 85 years of age. What are the signs or symptoms?  Pain in the belly. Pain is often on the left side, but it may be in other areas.  Fever and feeling cold.  Feeling like you may vomit.  Vomiting.  Having cramps.  Feeling full.  Changes to how often you poop.  Blood in your poop. How is this treated? Most cases are treated at home by:  Taking over-the-counter pain medicines.  Following a clear liquid diet.  Taking antibiotic medicines.  Resting. Very bad cases may need to be treated at a hospital. This may include:  Not eating or drinking.  Taking prescription pain medicine.  Getting antibiotic medicines through an IV tube.  Getting fluid and food through an IV tube.  Having surgery. When you are feeling better, your doctor may tell you to have a test to check your colon (colonoscopy). Follow these instructions at home: Medicines  Take over-the-counter and prescription medicines only as told by your doctor. These include: ? Antibiotics. ? Pain medicines. ? Fiber  pills. ? Probiotics. ? Stool softeners.  If you were prescribed an antibiotic medicine, take it as told by your doctor. Do not stop taking the antibiotic even if you start to feel better.  Ask your doctor if the medicine prescribed to you requires you to avoid driving or using machinery. Eating and drinking  Follow a diet as told by your doctor.  When you feel better, your doctor may tell you to change your diet. You may need to eat a lot of fiber. Fiber makes it easier to poop (have a bowel movement). Foods with fiber include: ? Berries. ? Beans. ? Lentils. ? Green vegetables.  Avoid eating red meat.   General instructions  Do not use any products that contain nicotine or tobacco, such as cigarettes, e-cigarettes, and chewing tobacco. If you need help quitting, ask your doctor.  Exercise 3 or more times a week. Try to get 30 minutes each time. Exercise enough to sweat and make your heart beat faster.  Keep all follow-up visits as told by your doctor. This is important. Contact a doctor if:  Your pain does not get better.  You are not pooping like normal. Get help right away if:  Your pain gets worse.  Your symptoms do not get better.  Your symptoms get worse very fast.  You have a fever.  You vomit more than one time.  You have poop that is: ? Bloody. ? Black. ? Tarry. Summary  This condition happens when   small pouches in your colon get infected or swollen.  Take medicines only as told by your doctor.  Follow a diet as told by your doctor.  Keep all follow-up visits as told by your doctor. This is important. This information is not intended to replace advice given to you by your health care provider. Make sure you discuss any questions you have with your health care provider. Document Revised: 03/23/2019 Document Reviewed: 03/23/2019 Elsevier Patient Education  2021 Elsevier Inc.  

## 2020-11-10 NOTE — Progress Notes (Signed)
   This service is provided via telemedicine  No vital signs collected/recorded due to the encounter was a telemedicine visit.   Location of patient (ex: home, work): Home.  Patient consents to a telephone visit: Yes.  Location of the provider (ex: office, home): [facility].  Name of any referring provider: Yvonna Alanis, NP   Names of all persons participating in the telemedicine service and their role in the encounter: Patient, Heriberto Ant[place], [place], Seth Bake, NP.    Time spent on call: 8 minutes spent on the phone with Medical Assistant.

## 2020-11-10 NOTE — Progress Notes (Signed)
DATE:  11/10/2020   MRN:  [id number]  BIRTHDAY: [birth year]   Contact Information    Name Relation Home Work [place] Daughter [phone]       Code Status History    Date Active Date Inactive Code Status Order ID Comments User Context   08/16/2013 1534 08/17/2013 1703 Full Code [id number]  Modena Jansky, MD Inpatient   Advance Care Planning Activity    Questions for Most Recent Historical Code Status (Order [id number])         Advance Directive Documentation   Flowsheet Row Most Recent Value  Type of Advance Directive Healthcare Power of Attorney, Living will  Pre-existing out of facility DNR order (yellow form or pink MOST form) --  "MOST" Form in Place? --       Chief Complaint  Patient presents with  . Acute Visit    Complains of Diverticulitis    HISTORY OF PRESENT ILLNESS:  This is an 87 year old female who is having a televisit due to having LLQ abdominal pain which started this morning, 6/10. She describes it as dull achy pain. She feels like she has gas but it would't come out. She stated that she had bowel movement yesterday afternoon and that she takes Metamucil daily. No reported fever. She has been having this diverticulitis flares and stated that if she does not get the medicine sooner then the pain gets worse.  PAST MEDICAL HISTORY:  Past Medical History:  Diagnosis Date  . Anxiety   . Chest pain 02/08/15   ETT normal  . Diverticulosis    Diverticulitis (1982)  . Fracture of rib of right side 08/16/2013  . GERD (gastroesophageal reflux disease)   . Hyperlipidemia, mixed   . Hypothyroidism   . IBS (irritable bowel syndrome)   . Morton's neuroma   . Right lumbar radiculopathy    Intermittent x 2 episodes in summer 2014  . Shingles 1967 and 2010  . Ulcer   . Urine incontinence   . Vertigo      CURRENT MEDICATIONS: Reviewed  Patient's Medications  New Prescriptions   No medications on file  Previous  Medications   ACETAMINOPHEN (TYLENOL) 500 MG TABLET    Take 1,000 mg by mouth every 6 (six) hours as needed for headache.    ASPIRIN 81 MG TABLET    Take 81 mg by mouth every morning.    ATORVASTATIN (LIPITOR) 10 MG TABLET    Take 1 tablet (10 mg total) by mouth daily.   GABAPENTIN (NEURONTIN) 100 MG CAPSULE    Take 300 mg by mouth at bedtime.   LEVOTHYROXINE (SYNTHROID) 88 MCG TABLET    Take 1 tablet (88 mcg total) by mouth daily.   LORAZEPAM (ATIVAN) 1 MG TABLET    Take 1 tablet (1 mg total) by mouth every 8 (eight) hours as needed for anxiety.   MAGNESIUM 250 MG TABS    Take 2 tablets by mouth daily.    METAMUCIL FIBER CHEW    Chew by mouth.   NYSTATIN CREAM (MYCOSTATIN)    Apply 1 application topically 2 (two) times daily. To left breast fold   OMEPRAZOLE (PRILOSEC) 40 MG CAPSULE    Take 1 capsule (40 mg total) by mouth daily.   PSYLLIUM (HYDROCIL/METAMUCIL) 95 % PACK    Take 1 packet by mouth daily.   TRAMADOL (ULTRAM) 50 MG TABLET    Take 25 mg by mouth daily.  Modified Medications   No medications  on file  Discontinued Medications   No medications on file     No Known Allergies   REVIEW OF SYSTEMS:  GENERAL: no change in appetite, no fatigue, no weight changes, no fever, chills or weakness SKIN: Denies rash, itching, wounds, ulcer sores, or nail abnormality MOUTH and THROAT: Denies oral discomfort, gingival pain or bleeding RESPIRATORY: no cough, SOB, DOE, wheezing, hemoptysis CARDIAC: no chest pain, edema or palpitations GI: + abdominal pain, no diarrhea nor constipation GU: Denies dysuria, frequency, hematuria, incontinence, or discharge NEUROLOGICAL: Denies dizziness, syncope, numbness, or headache PSYCHIATRIC: Denies feeling of depression or anxiety. No report of hallucinations, insomnia, paranoia, or agitation    LABS/RADIOLOGY: Labs reviewed: Basic Metabolic Panel: Recent Labs    08/15/20 1236  NA 137  K 5.2  CL 103  CO2 27  GLUCOSE 98  BUN 14  CREATININE  0.76  CALCIUM 9.4   Liver Function Tests: Recent Labs    08/15/20 1236  AST 15  ALT 13  BILITOT 0.5  PROT 7.0   CBC: Recent Labs    08/15/20 1236  WBC 6.3  NEUTROABS 3,446  HGB 14.2  HCT 43.2  MCV 89.6  PLT 277       ASSESSMENT/PLAN:  1. Acute diverticulitis -  Will start on Cipro and Flagyl - ciprofloxacin (CIPRO) 500 MG tablet; Take 1 tablet (500 mg total) by mouth 2 (two) times daily for 10 days.  Dispense: 20 tablet; Refill: 0 - metroNIDAZOLE (FLAGYL) 500 MG tablet; Take 1 tablet (500 mg total) by mouth 2 (two) times daily for 10 days.  Dispense: 20 tablet; Refill: 0     Time spent on non face to face visit:  20 minutes  The patient gave consent to this telephone visit. Explained to the patient the risk and privacy issue that was involved with this telephone call.   The patient was advised to call back and ask for an in-person evaluation if the symptoms worsen or if the condition fails to improve.   Durenda Age, NP [employer] [phone]

## 2020-11-10 NOTE — Progress Notes (Incomplete)
DATE:  11/10/2020   MRN:  [id number]  BIRTHDAY: [birth year]   Contact Information    Name Relation Home Work [place] Daughter [phone]       Code Status History    Date Active Date Inactive Code Status Order ID Comments User Context   08/16/2013 1534 08/17/2013 1703 Full Code [id number]  Modena Jansky, MD Inpatient   Advance Care Planning Activity    Questions for Most Recent Historical Code Status (Order [id number])         Advance Directive Documentation   Flowsheet Row Most Recent Value  Type of Advance Directive Healthcare Power of Attorney, Living will  Pre-existing out of facility DNR order (yellow form or pink MOST form) -  "MOST" Form in Place? -       Chief Complaint  Patient presents with  . Acute Visit    Complains of Diverticulitis    HISTORY OF PRESENT ILLNESS:  This is an 87 year old female who is having a televisit due to having LLQ abdominal pain this morning, 6/10. She describes it as dull achy pain. She feels like she has gas but it would't come out. She stated that she had bowel movement yesterday afternoon and that she takes Metamucil daily. No reported fever. She has been having this diverticulitis flares and stated that if she does not get the medicine sooner then the pain gets worse.  PAST MEDICAL HISTORY:  Past Medical History:  Diagnosis Date  . Anxiety   . Chest pain 02/08/15   ETT normal  . Diverticulosis    Diverticulitis (1982)  . Fracture of rib of right side 08/16/2013  . GERD (gastroesophageal reflux disease)   . Hyperlipidemia, mixed   . Hypothyroidism   . IBS (irritable bowel syndrome)   . Morton's neuroma   . Right lumbar radiculopathy    Intermittent x 2 episodes in summer 2014  . Shingles 1967 and 2010  . Ulcer   . Urine incontinence   . Vertigo      CURRENT MEDICATIONS: Reviewed  Patient's Medications  New Prescriptions   No medications on file  Previous Medications    ACETAMINOPHEN (TYLENOL) 500 MG TABLET    Take 1,000 mg by mouth every 6 (six) hours as needed for headache.    ASPIRIN 81 MG TABLET    Take 81 mg by mouth every morning.    ATORVASTATIN (LIPITOR) 10 MG TABLET    Take 1 tablet (10 mg total) by mouth daily.   GABAPENTIN (NEURONTIN) 100 MG CAPSULE    Take 300 mg by mouth at bedtime.   LEVOTHYROXINE (SYNTHROID) 88 MCG TABLET    Take 1 tablet (88 mcg total) by mouth daily.   LORAZEPAM (ATIVAN) 1 MG TABLET    Take 1 tablet (1 mg total) by mouth every 8 (eight) hours as needed for anxiety.   MAGNESIUM 250 MG TABS    Take 2 tablets by mouth daily.    METAMUCIL FIBER CHEW    Chew by mouth.   NYSTATIN CREAM (MYCOSTATIN)    Apply 1 application topically 2 (two) times daily. To left breast fold   OMEPRAZOLE (PRILOSEC) 40 MG CAPSULE    Take 1 capsule (40 mg total) by mouth daily.   PSYLLIUM (HYDROCIL/METAMUCIL) 95 % PACK    Take 1 packet by mouth daily.   TRAMADOL (ULTRAM) 50 MG TABLET    Take 25 mg by mouth daily.  Modified Medications   No medications on file  Discontinued Medications   No medications on file     No Known Allergies   REVIEW OF SYSTEMS:  GENERAL: no change in appetite, no fatigue, no weight changes, no fever, chills or weakness SKIN: Denies rash, itching, wounds, ulcer sores, or nail abnormality MOUTH and THROAT: Denies oral discomfort, gingival pain or bleeding RESPIRATORY: no cough, SOB, DOE, wheezing, hemoptysis CARDIAC: no chest pain, edema or palpitations GI: + abdominal pain, no diarrhea nor constipation GU: Denies dysuria, frequency, hematuria, incontinence, or discharge NEUROLOGICAL: Denies dizziness, syncope, numbness, or headache PSYCHIATRIC: Denies feeling of depression or anxiety. No report of hallucinations, insomnia, paranoia, or agitation    LABS/RADIOLOGY: Labs reviewed: Basic Metabolic Panel: Recent Labs    08/15/20 1236  NA 137  K 5.2  CL 103  CO2 27  GLUCOSE 98  BUN 14  CREATININE 0.76  CALCIUM  9.4   Liver Function Tests: Recent Labs    08/15/20 1236  AST 15  ALT 13  BILITOT 0.5  PROT 7.0   CBC: Recent Labs    08/15/20 1236  WBC 6.3  NEUTROABS 3,446  HGB 14.2  HCT 43.2  MCV 89.6  PLT 277       ASSESSMENT/PLAN:  1. Acute diverticulitis -  Will start on Cipro and Flagyl - ciprofloxacin (CIPRO) 500 MG tablet; Take 1 tablet (500 mg total) by mouth 2 (two) times daily for 10 days.  Dispense: 20 tablet; Refill: 0 - metroNIDAZOLE (FLAGYL) 500 MG tablet; Take 1 tablet (500 mg total) by mouth 2 (two) times daily for 10 days.  Dispense: 20 tablet; Refill: 0      Time spent on non face to face visit:  20 minutes  The patient gave consent to this telephone visit. Explained to the patient the risk and privacy issue that was involved with this telephone call.   The patient was advised to call back and ask for an in-person evaluation if the symptoms worsen or if the condition fails to improve.   Durenda Age, NP [employer] [phone]

## 2020-11-16 ENCOUNTER — Other Ambulatory Visit: Payer: Self-pay

## 2020-11-16 DIAGNOSIS — K5792 Diverticulitis of intestine, part unspecified, without perforation or abscess without bleeding: Secondary | ICD-10-CM

## 2020-11-17 ENCOUNTER — Other Ambulatory Visit: Payer: Self-pay | Admitting: Orthopedic Surgery

## 2020-11-17 NOTE — Telephone Encounter (Signed)
RX last filled in epic  on 09/20/2020 #90 with 1 refill  No treatment agreement on file, pending appointment for 12/08/20 to sign treatment agreement

## 2020-11-22 ENCOUNTER — Other Ambulatory Visit: Payer: Self-pay | Admitting: Orthopedic Surgery

## 2020-11-22 MED ORDER — ATORVASTATIN CALCIUM 10 MG PO TABS: 10.0000 mg | ORAL_TABLET | Freq: Every day | ORAL | 1 refills | Status: DC

## 2020-11-22 NOTE — Telephone Encounter (Signed)
It says the selected pharmacy will not accept Rx electronically

## 2020-11-22 NOTE — Telephone Encounter (Signed)
RX was approved by Amy on 11/17/2020, class was set to print versus normal (electronically)

## 2020-11-25 ENCOUNTER — Other Ambulatory Visit: Payer: Self-pay | Admitting: *Deleted

## 2020-11-25 NOTE — Telephone Encounter (Signed)
Received refill Request from pharmacy [facility]: 10/28/2020 No contract on File, added note to upcoming appointment Pended Rx and sent to [facility] for approval due to Amy out of office.

## 2020-11-28 ENCOUNTER — Other Ambulatory Visit: Payer: Self-pay | Admitting: *Deleted

## 2020-11-28 MED ORDER — OMEPRAZOLE 40 MG PO CPDR: 40.0000 mg | DELAYED_RELEASE_CAPSULE | Freq: Every day | ORAL | 1 refills | Status: DC

## 2020-11-28 MED ORDER — OMEPRAZOLE 40 MG PO CPDR
40.0000 mg | DELAYED_RELEASE_CAPSULE | Freq: Every day | ORAL | 1 refills | Status: DC
Start: 2020-11-28 — End: 2020-11-28

## 2020-11-28 NOTE — Telephone Encounter (Signed)
Pharmacy requested refill

## 2020-12-08 ENCOUNTER — Encounter: Payer: Self-pay | Admitting: Orthopedic Surgery

## 2020-12-08 ENCOUNTER — Other Ambulatory Visit: Payer: Self-pay

## 2020-12-08 ENCOUNTER — Ambulatory Visit: Payer: Medicare HMO | Admitting: Orthopedic Surgery

## 2020-12-08 VITALS — BP 136/64 | HR 83 | Temp 97.3°F | Ht 65.0 in | Wt 159.4 lb

## 2020-12-08 DIAGNOSIS — G8929 Other chronic pain: Secondary | ICD-10-CM

## 2020-12-08 DIAGNOSIS — I10 Essential (primary) hypertension: Secondary | ICD-10-CM | POA: Diagnosis not present

## 2020-12-08 DIAGNOSIS — R053 Chronic cough: Secondary | ICD-10-CM

## 2020-12-08 DIAGNOSIS — E039 Hypothyroidism, unspecified: Secondary | ICD-10-CM

## 2020-12-08 DIAGNOSIS — M810 Age-related osteoporosis without current pathological fracture: Secondary | ICD-10-CM | POA: Diagnosis not present

## 2020-12-08 DIAGNOSIS — M5441 Lumbago with sciatica, right side: Secondary | ICD-10-CM | POA: Diagnosis not present

## 2020-12-08 DIAGNOSIS — E782 Mixed hyperlipidemia: Secondary | ICD-10-CM

## 2020-12-08 DIAGNOSIS — G603 Idiopathic progressive neuropathy: Secondary | ICD-10-CM | POA: Diagnosis not present

## 2020-12-08 DIAGNOSIS — M5442 Lumbago with sciatica, left side: Secondary | ICD-10-CM

## 2020-12-08 DIAGNOSIS — K5790 Diverticulosis of intestine, part unspecified, without perforation or abscess without bleeding: Secondary | ICD-10-CM

## 2020-12-08 DIAGNOSIS — B372 Candidiasis of skin and nail: Secondary | ICD-10-CM

## 2020-12-08 MED ORDER — TRAMADOL HCL 50 MG PO TABS: 25.0000 mg | ORAL_TABLET | Freq: Every day | ORAL | 0 refills | Status: DC

## 2020-12-08 MED ORDER — DENOSUMAB 60 MG/ML ~~LOC~~ SOSY
60.0000 mg | PREFILLED_SYRINGE | Freq: Once | SUBCUTANEOUS | Status: AC
  Administered 2020-12-08: 60 mg via SUBCUTANEOUS

## 2020-12-08 NOTE — Progress Notes (Signed)
Careteam: Patient Care Team: Yvonna Alanis, NP as PCP - General (Adult Health Nurse Practitioner) Monna Fam, MD as Consulting Physician (Ophthalmology) Gerarda Fraction, MD as Referring Physician (Ophthalmology) Carole Civil, MD as Consulting Physician (Orthopedic Surgery)  Seen by: Windell Moulding, AGNP-C  PLACE OF SERVICE:  [place] Directive information    No Known Allergies  No chief complaint on file.  HPI: Patient is a 87 y.o. female seen today for medical management of chronic conditions.   Daughter present during encounter.   Acute diverticulitis 05/19. Completed cipro and flagyl. She denies abdominal pain today. Admits she needs a better diet, eats foods with a lot of seeds and nuts.   Skin under breast- improved. Using under armor fabric under breasts prn to prevent moisture.   Evenity injections from 08/2019 to 08/2020. Prolia injections scheduled today. Admits to staying active with light walking and stairs.   Back pain/ neuropathy- it has increased in the past month. Continues to take gabapentin and tramadol for pain. Most of her pain is at night, described feet feeling numb and cold.   Continues to take ativan 1 mg tid. Denies panic attacks.   No recent falls or injuries.           Review of Systems:  Review of Systems  Constitutional:  Negative for chills, fever, malaise/fatigue and weight loss.  HENT:  Negative for hearing loss.   Eyes:  Negative for blurred vision.  Respiratory:  Negative for cough, shortness of breath and wheezing.   Cardiovascular:  Negative for chest pain and leg swelling.  Gastrointestinal:  Positive for heartburn. Negative for abdominal pain, blood in stool, constipation, diarrhea, nausea and vomiting.  Genitourinary:  Negative for dysuria, frequency and hematuria.  Musculoskeletal:  Positive for back pain, joint pain and myalgias. Negative for falls.  Skin:  Positive for rash.  Neurological:  Positive  for tingling and weakness. Negative for dizziness and headaches.  Psychiatric/Behavioral:  Negative for depression. The patient is nervous/anxious. The patient does not have insomnia.    Past Medical History:  Diagnosis Date   Anxiety    Chest pain 02/08/15   ETT normal   Diverticulosis    Diverticulitis (1982)   Fracture of rib of right side 08/16/2013   GERD (gastroesophageal reflux disease)    Hyperlipidemia, mixed    Hypothyroidism    IBS (irritable bowel syndrome)    Morton's neuroma    Right lumbar radiculopathy    Intermittent x 2 episodes in summer 2014   Shingles 1967 and 2010   Ulcer    Urine incontinence    Vertigo    Past Surgical History:  Procedure Laterality Date   CARDIOVASCULAR STRESS TEST  02/08/15   ETT normal   CATARACT EXTRACTION  2012   Dr. Herbert Deaner   COLONOSCOPY  2005   Dr. Arsenio Loader at [facility] (normal per pt report)   DEXA  11/03/13   Heel T score -0.8.   [place]   right foot   TONSILLECTOMY     TONSILLECTOMY AND ADENOIDECTOMY  1948   Dr. [place]   WISDOM TOOTH EXTRACTION     Social History:   reports that she has quit smoking. Her smoking use included cigarettes. She started smoking about 37 years ago. She has a 8.00 pack-year smoking history. She has never used smokeless tobacco. She reports that she does not drink alcohol and does  not use drugs.  Family History  Problem Relation Age of Onset   Tuberculosis Mother    Pancreatic cancer Father    Asthma Daughter    Heart attack Paternal Grandfather     Medications: Patient's Medications  New Prescriptions   No medications on file  Previous Medications   ACETAMINOPHEN (TYLENOL) 500 MG TABLET    Take 1,000 mg by mouth every 6 (six) hours as needed for headache.    ASPIRIN 81 MG TABLET    Take 81 mg by mouth every morning.    ATORVASTATIN (LIPITOR) 10 MG TABLET    Take 1 tablet (10 mg total) by mouth daily.   GABAPENTIN (NEURONTIN) 100  MG CAPSULE    Take 300 mg by mouth at bedtime.   LEVOTHYROXINE (SYNTHROID) 88 MCG TABLET    Take 1 tablet (88 mcg total) by mouth daily.   LORAZEPAM (ATIVAN) 1 MG TABLET    TAKE ONE TABLET BY MOUTH every EIGHT hours AS NEEDED FOR ANXIETY   MAGNESIUM 250 MG TABS    Take 2 tablets by mouth daily.    METAMUCIL FIBER CHEW    Chew by mouth.   NYSTATIN CREAM (MYCOSTATIN)    Apply 1 application topically 2 (two) times daily. To left breast fold   OMEPRAZOLE (PRILOSEC) 40 MG CAPSULE    Take 1 capsule (40 mg total) by mouth daily.   PSYLLIUM (HYDROCIL/METAMUCIL) 95 % PACK    Take 1 packet by mouth daily.   TRAMADOL (ULTRAM) 50 MG TABLET    Take 25 mg by mouth daily.  Modified Medications   No medications on file  Discontinued Medications   No medications on file    Physical Exam:  There were no vitals filed for this visit. There is no height or weight on file to calculate BMI. Wt Readings from Last 3 Encounters:  10/19/20 159 lb (72.1 kg)  09/26/20 162 lb (73.5 kg)  08/15/20 159 lb 3.2 oz (72.2 kg)    Physical Exam Vitals reviewed.  Constitutional:      General: She is not in acute distress. HENT:     Head: Normocephalic.     Right Ear: There is no impacted cerumen.     Left Ear: There is no impacted cerumen.     Nose: Nose normal.     Mouth/Throat:     Mouth: Mucous membranes are moist.  Eyes:     General:        Right eye: No discharge.        Left eye: No discharge.  Neck:     Thyroid: No thyroid mass or thyromegaly.  Cardiovascular:     Rate and Rhythm: Normal rate and regular rhythm.     Pulses: Normal pulses.     Heart sounds: Normal heart sounds. No murmur heard. Pulmonary:     Effort: Pulmonary effort is normal. No respiratory distress.     Breath sounds: Normal breath sounds. No wheezing.  Abdominal:     General: Bowel sounds are normal. There is no distension.     Palpations: Abdomen is soft.     Tenderness: There is no abdominal tenderness.  Musculoskeletal:      Cervical back: Normal range of motion.     Right lower leg: No edema.     Left lower leg: No edema.  Lymphadenopathy:     Cervical: No cervical adenopathy.  Skin:    General: Skin is warm and dry.     Capillary Refill: Capillary  refill takes less than 2 seconds.  Neurological:     General: No focal deficit present.     Mental Status: She is alert and oriented to person, place, and time.     Cranial Nerves: No cranial nerve deficit.     Motor: No weakness.  Psychiatric:        Mood and Affect: Mood normal.        Behavior: Behavior normal.    Labs reviewed: Basic Metabolic Panel: Recent Labs    08/15/20 1236  NA 137  K 5.2  CL 103  CO2 27  GLUCOSE 98  BUN 14  CREATININE 0.76  CALCIUM 9.4   Liver Function Tests: Recent Labs    08/15/20 1236  AST 15  ALT 13  BILITOT 0.5  PROT 7.0   No results for input(s): LIPASE, AMYLASE in the last 8760 hours. No results for input(s): AMMONIA in the last 8760 hours. CBC: Recent Labs    08/15/20 1236  WBC 6.3  NEUTROABS 3,446  HGB 14.2  HCT 43.2  MCV 89.6  PLT 277   Lipid Panel: No results for input(s): CHOL, HDL, LDLCALC, TRIG, CHOLHDL, LDLDIRECT in the last 8760 hours. TSH: No results for input(s): TSH in the last 8760 hours. A1C: Lab Results  Component Value Date   HGBA1C 5.6 08/15/2020     Assessment/Plan 1. Senile osteoporosis - completed evenity injections 08/2020 - no recent falls or injuries - denosumab (PROLIA) injection 60 mg- today - recommend weight bearing exercise like walking - prolia injection in 6 months  2. Idiopathic progressive neuropathy - c/o lower extremity numbness and feeling cold - cont gabapentin 300 mg qhs - recommend trying 100 mg gabapentin qam - Ambulatory referral to Neurology  3. Chronic midline low back pain with bilateral sciatica - see above - cont tramadol 25 mg qhs  4. Primary hypertension - controlled without medication - cont low sodium diet - cbc/diff-  future - bmp- future  5. Candidiasis of skin - resolved - cont to use moisture absorbing fabrics at skin folds  6. Diverticular disease - second episode in 6 months - completed cipro and flagyl  - recommend diet that limits seeds, nuts, processed sugars, excess carbs, fats and red meat  7. Acquired hypothyroidism - TSH 2.41 08/24/2019 - cont levothyroxine 88 mcg daily - TSH- future  8. Mixed hyperlipidemia - LDL 81 11/04/2014 - lipid panel- future  Total time: 35 minutes. Greater than 50% of total time spent doing patient education regarding back pain and medication management.    [place], [facility] [phone]

## 2020-12-08 NOTE — Patient Instructions (Signed)
May try gabapentin 100 mg daily in the morning and 300 mg at night.

## 2020-12-09 ENCOUNTER — Ambulatory Visit: Payer: Medicare HMO | Admitting: Orthopedic Surgery

## 2020-12-12 ENCOUNTER — Other Ambulatory Visit: Payer: Self-pay | Admitting: Orthopedic Surgery

## 2020-12-12 ENCOUNTER — Telehealth: Payer: Self-pay | Admitting: *Deleted

## 2020-12-12 DIAGNOSIS — M5442 Lumbago with sciatica, left side: Secondary | ICD-10-CM

## 2020-12-12 DIAGNOSIS — G8929 Other chronic pain: Secondary | ICD-10-CM

## 2020-12-12 MED ORDER — TRAMADOL HCL 50 MG PO TABS: 25.0000 mg | ORAL_TABLET | Freq: Every day | ORAL | 0 refills | Status: DC

## 2020-12-12 NOTE — Telephone Encounter (Signed)
Patient called and stated that the pharmacy only gave her #4 pills of Tramadol which would be only for 8 days. Stated that the insurance will only cover #7 for the first Rx.   Stated that she will need another Rx sent to her pharmacy for #26 if you want her to continue the medication.   Please Advise.

## 2020-12-12 NOTE — Telephone Encounter (Signed)
Another script sent to pharmacy. Please remind her Tramadol is temporary, if she is not in pain try to skip a dose. I addition, I advised her to try gabapentin 100 mg every AM to help with pain.

## 2020-12-13 NOTE — Telephone Encounter (Signed)
LMOM for patient to return call.

## 2020-12-13 NOTE — Telephone Encounter (Signed)
Patient notified and agreed.  

## 2020-12-21 ENCOUNTER — Other Ambulatory Visit: Payer: Self-pay | Admitting: Nurse Practitioner

## 2020-12-21 NOTE — Telephone Encounter (Signed)
Pharmacy requested refill Epic LR: 11/22/2020 Needs contract signed, added to upcoming appointment.  Pended Rx and sent to Amy for approval.

## 2020-12-30 ENCOUNTER — Encounter: Payer: Self-pay | Admitting: Orthopedic Surgery

## 2020-12-30 ENCOUNTER — Other Ambulatory Visit: Payer: Self-pay

## 2020-12-30 ENCOUNTER — Encounter: Payer: Self-pay | Admitting: Family

## 2020-12-30 ENCOUNTER — Ambulatory Visit (INDEPENDENT_AMBULATORY_CARE_PROVIDER_SITE_OTHER): Payer: Medicare HMO | Admitting: Family

## 2020-12-30 VITALS — BP 142/78 | HR 75 | Temp 96.9°F | Resp 18 | Ht 65.0 in | Wt 160.1 lb

## 2020-12-30 DIAGNOSIS — W57XXXA Bitten or stung by nonvenomous insect and other nonvenomous arthropods, initial encounter: Secondary | ICD-10-CM | POA: Diagnosis not present

## 2020-12-30 DIAGNOSIS — S80862A Insect bite (nonvenomous), left lower leg, initial encounter: Secondary | ICD-10-CM | POA: Diagnosis not present

## 2020-12-30 MED ORDER — DOXYCYCLINE HYCLATE 100 MG PO TABS: 100.0000 mg | ORAL_TABLET | Freq: Two times a day (BID) | ORAL | 0 refills | Status: AC

## 2020-12-30 MED ORDER — SACCHAROMYCES BOULARDII 250 MG PO CAPS: 250.0000 mg | ORAL_CAPSULE | Freq: Two times a day (BID) | ORAL | 0 refills | Status: DC

## 2020-12-30 NOTE — Progress Notes (Signed)
Provider: Attila Mccarthy FNP-C  Yvonna Alanis, NP  Patient Care Team: Yvonna Alanis, NP as PCP - General (Adult Health Nurse Practitioner) Monna Fam, MD as Consulting Physician (Ophthalmology) Gerarda Fraction, MD as Referring Physician (Ophthalmology) Carole Civil, MD as Consulting Physician (Orthopedic Surgery)  Extended Emergency Contact Information Primary Emergency Contact: Terry,Tish Address: [address]          [place], [place] [address] [address] [place] Phone: [phone] Work Phone: [phone] Mobile Phone: [phone] Relation: Daughter  Code Status:  DNR Goals of care: Advanced Directive information Advanced Directives 12/30/2020  Does Patient Have a Medical Advance Directive? Yes  Type of Paramedic of [place];Living will  Does patient want to make changes to medical advance directive? No - Patient declined  Copy of [place] in Chart? Yes - validated most recent copy scanned in chart (See row information)  Would patient like information on creating a medical advance directive? -  Pre-existing out of facility DNR order (yellow form or pink MOST form) -     Chief Complaint  Patient presents with   Acute Visit    Patient presents for a tick bite    HPI:  Pt is a 87 y.o. female seen today for an acute visit for evaluation of tick bite since last Tuesday 01/19/2021.States lives with her daughter but sometimes goes back to her house.she was on the steps of her house when she was bitten on both upper thigh.Has lots of [place] that comes to her compound.she used tweezers to remove the ticks.   Right inner thigh area tick bite has healed well but behind left thigh area Bite has a round circle, red  and slightly sore in the middle.she thought she felt something in the middle.   Felt weak few days ago but resolved.sometimes has some chronic tiredness.feels good today. Has had no headache, fever, chills  ,muscle aches, nausea or vomiting.   Past Medical History:  Diagnosis Date   Anxiety    Chest pain 02/08/15   ETT normal   Diverticulosis    Diverticulitis (1982)   Fracture of rib of right side 08/16/2013   GERD (gastroesophageal reflux disease)    Hyperlipidemia, mixed    Hypothyroidism    IBS (irritable bowel syndrome)    Morton's neuroma    Right lumbar radiculopathy    Intermittent x 2 episodes in summer 2014   Shingles 1967 and 2010   Ulcer    Urine incontinence    Vertigo    Past Surgical History:  Procedure Laterality Date   CARDIOVASCULAR STRESS TEST  02/08/15   ETT normal   CATARACT EXTRACTION  2012   Dr. Herbert Deaner   COLONOSCOPY  2005   Dr. Arsenio Loader at [facility] (normal per pt report)   DEXA  11/03/13   Heel T score -0.8.   [place]   right foot   TONSILLECTOMY     TONSILLECTOMY AND ADENOIDECTOMY  1948   Dr. [place] EXTRACTION      No Known Allergies  Outpatient Encounter Medications as of 12/30/2020  Medication Sig   acetaminophen (TYLENOL) 500 MG tablet Take 1,000 mg by mouth every 6 (six) hours as needed for headache.    aspirin 81 MG tablet Take 81 mg by mouth every morning.    atorvastatin (LIPITOR) 10 MG tablet Take 1 tablet (10 mg  total) by mouth daily.   gabapentin (NEURONTIN) 100 MG capsule Take 300 mg by mouth at bedtime.   levothyroxine (SYNTHROID) 88 MCG tablet Take 1 tablet (88 mcg total) by mouth daily.   LORazepam (ATIVAN) 1 MG tablet TAKE ONE TABLET BY MOUTH EVERY 8 HOURS AS NEEDED FOR ANXIETY   Magnesium 250 MG TABS Take 2 tablets by mouth daily.    Metamucil Fiber CHEW Chew by mouth.   nystatin cream (MYCOSTATIN) Apply 1 application topically 2 (two) times daily. To left breast fold   omeprazole (PRILOSEC) 40 MG capsule Take 1 capsule (40 mg total) by mouth daily.   psyllium (HYDROCIL/METAMUCIL) 95 % PACK Take 1 packet by mouth daily.   traMADol (ULTRAM) 50 MG tablet Take  0.5 tablets (25 mg total) by mouth daily.   Facility-Administered Encounter Medications as of 12/30/2020  Medication   Romosozumab-aqqg (EVENITY) 105 MG/1.17ML injection 210 mg    Review of Systems  Constitutional:  Negative for appetite change, chills, fatigue, fever and unexpected weight change.  HENT:  Negative for congestion, hearing loss, rhinorrhea, sinus pressure, sinus pain, sneezing and sore throat.   Eyes:  Negative for pain, discharge, redness, itching and visual disturbance.  Respiratory:  Negative for cough, chest tightness, shortness of breath and wheezing.   Cardiovascular:  Negative for chest pain, palpitations and leg swelling.  Gastrointestinal:  Negative for abdominal distention, abdominal pain, constipation, diarrhea, nausea and vomiting.  Genitourinary:  Negative for difficulty urinating, dysuria, flank pain, frequency, hematuria and urgency.  Musculoskeletal:  Negative for arthralgias, back pain, gait problem, joint swelling, myalgias, neck pain and neck stiffness.  Skin:  Negative for color change, pallor, rash and wound.       Tick bite per HPI   Neurological:  Negative for dizziness, syncope, weakness, light-headedness, numbness and headaches.  Hematological:  Does not bruise/bleed easily.  Psychiatric/Behavioral:  Negative for agitation, behavioral problems, confusion and sleep disturbance. The patient is not nervous/anxious.    Immunization History  Administered Date(s) Administered   Influenza Split 02/24/2012   Influenza, High Dose Seasonal PF 04/29/2018, 04/24/2019   Influenza,inj,Quad PF,6+ Mos 02/23/2013   Influenza-Unspecified 03/08/2015, 03/26/2017, 05/14/2020   PFIZER(Purple Top)SARS-COV-2 Vaccination 07/15/2019, 08/14/2019, 04/14/2020   Pneumococcal Conjugate-13 03/25/1996, 03/20/2013   Pneumococcal Polysaccharide-23 04/16/1996, 03/20/2013   Pneumococcal-Unspecified 03/20/2013   Td 12/20/2018   Tdap 09/17/2013   Zoster, Live 08/23/2012   Pertinent   Health Maintenance Due  Topic Date Due   PNA vac Low Risk Adult (2 of 2 - PPSV23) 03/20/2014   INFLUENZA VACCINE  01/23/2021   MAMMOGRAM  08/03/2021   DEXA SCAN  Completed   Fall Risk  12/30/2020 11/10/2020 10/19/2020 10/19/2020 09/26/2020  Falls in the past year? 0 0 - 0 0  Number falls in past yr: 0 0 - 0 0  Comment - - - - -  Injury with Fall? 0 0 - 0 0  Comment - - - - -  Risk for fall due to : No Fall Risks - History of fall(s);Impaired balance/gait;Impaired mobility;Orthopedic patient Impaired mobility;Impaired balance/gait;Orthopedic patient -  Follow up Falls evaluation completed - Falls prevention discussed Falls prevention discussed -   Functional Status Survey:    Vitals:   12/30/20 1549  BP: (!) 142/78  Pulse: 75  Resp: 18  Temp: (!) 96.9 F (36.1 C)  SpO2: 97%  Weight: 160 lb 1.6 oz (72.6 kg)  Height: 5\' 5"  (1.651 m)   Body mass index is 26.64 kg/m. Physical Exam Vitals reviewed.  Constitutional:      General: She is not in acute distress.    Appearance: Normal appearance. She is overweight. She is not ill-appearing or diaphoretic.  HENT:     Head: Normocephalic.     Right Ear: Tympanic membrane, ear canal and external ear normal. There is no impacted cerumen.     Left Ear: Tympanic membrane, ear canal and external ear normal. There is no impacted cerumen.     Nose: Nose normal. No congestion or rhinorrhea.     Mouth/Throat:     Mouth: Mucous membranes are moist.     Pharynx: Oropharynx is clear. No oropharyngeal exudate or posterior oropharyngeal erythema.  Eyes:     General: No scleral icterus.       Right eye: No discharge.        Left eye: No discharge.     Extraocular Movements: Extraocular movements intact.     Conjunctiva/sclera: Conjunctivae normal.     Pupils: Pupils are equal, round, and reactive to light.  Cardiovascular:     Rate and Rhythm: Normal rate and regular rhythm.     Pulses: Normal pulses.     Heart sounds: Normal heart sounds. No  murmur heard.   No friction rub. No gallop.  Pulmonary:     Effort: Pulmonary effort is normal. No respiratory distress.     Breath sounds: Normal breath sounds. No wheezing, rhonchi or rales.  Chest:     Chest wall: No tenderness.  Abdominal:     General: Bowel sounds are normal. There is no distension.     Palpations: Abdomen is soft. There is no mass.     Tenderness: There is no abdominal tenderness. There is no right CVA tenderness, left CVA tenderness, guarding or rebound.  Musculoskeletal:        General: No swelling or tenderness. Normal range of motion.     Cervical back: Normal range of motion. No rigidity or tenderness.     Right lower leg: No edema.     Left lower leg: No edema.  Lymphadenopathy:     Cervical: No cervical adenopathy.  Skin:    General: Skin is warm and dry.     Coloration: Skin is not pale.     Findings: No bruising, lesion or rash.     Comments: Left posterior thigh 4 x 4 cm circular erythema area with pencil point spot in the middle tender to palpation.scab verified with Dani Gobble not any remains of tick. Surrounding skin tissue intact.   Neurological:     Mental Status: She is alert and oriented to person, place, and time.     Cranial Nerves: No cranial nerve deficit.     Sensory: No sensory deficit.     Motor: No weakness.     Coordination: Coordination normal.     Gait: Gait normal.  Psychiatric:        Mood and Affect: Mood normal.        Speech: Speech normal.        Behavior: Behavior normal.        Thought Content: Thought content normal.        Judgment: Judgment normal.    Labs reviewed: Recent Labs    08/15/20 1236  NA 137  K 5.2  CL 103  CO2 27  GLUCOSE 98  BUN 14  CREATININE 0.76  CALCIUM 9.4   Recent Labs    08/15/20 1236  AST 15  ALT 13  BILITOT 0.5  PROT  7.0   Recent Labs    08/15/20 1236  WBC 6.3  NEUTROABS 3,446  HGB 14.2  HCT 43.2  MCV 89.6  PLT 277   Lab Results  Component Value Date   TSH  2.41 08/24/2019   Lab Results  Component Value Date   HGBA1C 5.6 08/15/2020   Lab Results  Component Value Date   CHOL 152 11/04/2014   HDL 45.80 11/04/2014   LDLCALC 81 11/04/2014   TRIG 128.0 11/04/2014   CHOLHDL 3 11/04/2014    Significant Diagnostic Results in last 30 days:  No results found.  Assessment/Plan  Tick bite of left lower leg, initial encounter Afebrile.right inner thigh tick bite area healed. Left posterior thigh 4 x 4 cm circular erythema area with pencil point spot in the middle tender to palpation.scab verified with Dani Gobble not any remains of tick. Surrounding skin tissue intact.  Will treat with Doxycycline as below.Has used doxycyline in the past aware of side effects. Advised to take with OTC probiotics prefers to eat Yogurt  - doxycycline (VIBRA-TABS) 100 MG tablet; Take 1 tablet (100 mg total) by mouth 2 (two) times daily for 14 days.  Dispense: 28 tablet; Refill: 0 - saccharomyces boulardii (FLORASTOR) 250 MG capsule; Take 1 capsule (250 mg total) by mouth 2 (two) times daily.  Dispense: 14 capsule; Refill: 0 - Advised to notify provider if symptoms worsen or fail to improve.   Family/ staff Communication: Reviewed plan of care with patient and daughter verbalized understanding.  Labs/tests ordered: None   Next Appointment: As needed if symptoms worsen or fail to improve    Sandrea Hughs, NP

## 2021-01-31 ENCOUNTER — Other Ambulatory Visit: Payer: Self-pay | Admitting: Orthopedic Surgery

## 2021-01-31 NOTE — Telephone Encounter (Signed)
RX last filled in epic on 12/21/2020, no treatment agreement on file, and notation was already made on 06/15/21 appointment to sign treatment agreement

## 2021-02-20 ENCOUNTER — Other Ambulatory Visit: Payer: Self-pay | Admitting: Nurse Practitioner

## 2021-02-23 ENCOUNTER — Ambulatory Visit: Payer: Medicare HMO | Admitting: Neurology

## 2021-02-23 ENCOUNTER — Other Ambulatory Visit: Payer: Self-pay | Admitting: Nurse Practitioner

## 2021-02-27 ENCOUNTER — Other Ambulatory Visit: Payer: Self-pay | Admitting: Nurse Practitioner

## 2021-03-28 ENCOUNTER — Other Ambulatory Visit: Payer: Self-pay | Admitting: Orthopedic Surgery

## 2021-03-28 NOTE — Telephone Encounter (Signed)
Pharmacy requested refill.  Epic LR: 02/28/2021 Needs contract, added to upcoming appointment notes.  Pended Rx and sent to Amy for approval.

## 2021-04-02 ENCOUNTER — Other Ambulatory Visit: Payer: Self-pay | Admitting: Orthopedic Surgery

## 2021-04-02 DIAGNOSIS — G8929 Other chronic pain: Secondary | ICD-10-CM

## 2021-04-03 ENCOUNTER — Other Ambulatory Visit: Payer: Self-pay | Admitting: Orthopedic Surgery

## 2021-04-03 DIAGNOSIS — G8929 Other chronic pain: Secondary | ICD-10-CM

## 2021-04-03 NOTE — Telephone Encounter (Signed)
Patient has request refill on medication "Tramadol 50mg ". Patient last refill was 12/12/2020. Patient medication pend and sent to PCP Yvonna Alanis, NP . Please Advise.

## 2021-04-03 NOTE — Telephone Encounter (Signed)
Patient has request refill on medication "Tramadol 50mg ". Patient last refill was 12/12/2020. Patient medication pend and sent to PCP Yvonna Alanis, NP for approval.

## 2021-04-25 ENCOUNTER — Encounter: Payer: Self-pay | Admitting: Neurology

## 2021-04-25 ENCOUNTER — Telehealth: Payer: Self-pay | Admitting: Neurology

## 2021-04-25 ENCOUNTER — Ambulatory Visit: Payer: Medicare HMO | Admitting: Neurology

## 2021-04-25 VITALS — BP 147/74 | HR 79 | Ht 65.0 in | Wt 165.0 lb

## 2021-04-25 DIAGNOSIS — R269 Unspecified abnormalities of gait and mobility: Secondary | ICD-10-CM

## 2021-04-25 NOTE — Progress Notes (Signed)
Chief Complaint  Patient presents with   New Patient (Initial Visit)    Room 16. Pt here with daughter. Pt referred by Windell Moulding, NP for neuropathy, right low back pain, and frequent falls. Pt reports that her feet always feel cold.       ASSESSMENT AND PLAN  Julie Park is a 87 y.o. female   Gait abnormality, urinary incontinence, bilateral feet paresthesia,  Following her thoracic spine T10 compression fracture in September 2020  Brisk reflex at lower extremity, bilateral Babinski sign  Need to rule out thoracic myelopathy,  MRI of thoracic spine  Right low back pain, radiating pain to right lower extremity,  Possible right lumbar radiculopathy,  Encourage moderate exercise   DIAGNOSTIC DATA (LABS, IMAGING, TESTING) - I reviewed patient records, labs, notes, testing and imaging myself where available.   MEDICAL HISTORY:  Julie Park, is a 87 year old female, seen in request by her primary care nurse practitioner   Julie Park, Julie Park, for evaluation of gait abnormality, initial evaluation was with her daughter April 25, 2021  I reviewed and summarized the referring note. PMHx HLD Hypothyroidism Thoracic fracture Shingles, Left V2,  Left thoracic region Osteoporosis  She had a history of osteoporosis, received treatment over the past few years, suffered a compression fracture in September 2020, had a severe upper back pain, to conservative treatment, ever since then, she noticed some changes, she noticed mildly unsteady gait, tends to furniture walk at home, use cane for longer distances, she also noticed urinary urgency, frequency, occasionally urinary accident, denies bowel incident,  In addition, she complains of constant bilateral feet numbness, cold sensation, has to use heating pad, denies upper extremity paresthesia or weakness, denies significant neck pain  She also complains of low back pain, radiating pain to right hip, right lower extremity, sometimes  upper back hugging sensation around her abdomen   PHYSICAL EXAM:   Vitals:   04/25/21 1400  BP: (!) 147/74  Pulse: 79  Weight: 165 lb (74.8 kg)  Height: 5\' 5"  (1.651 m)   Not recorded     Body mass index is 27.46 kg/m.  PHYSICAL EXAMNIATION:  Gen: NAD, conversant, well nourised, well groomed                     Cardiovascular: Regular rate rhythm, no peripheral edema, warm, nontender. Eyes: Conjunctivae clear without exudates or hemorrhage Neck: Supple, no carotid bruits. Pulmonary: Clear to auscultation bilaterally   NEUROLOGICAL EXAM:  MENTAL STATUS: Speech:    Speech is normal; fluent and spontaneous with normal comprehension.  Cognition:     Orientation to time, place and person     Normal recent and remote memory     Normal Attention span and concentration     Normal Language, naming, repeating,spontaneous speech     Fund of knowledge   CRANIAL NERVES: CN II: Visual fields are full to confrontation. Pupils are round equal and briskly reactive to light. CN III, IV, VI: extraocular movement are normal. No ptosis. CN V: Facial sensation is intact to light touch CN VII: Face is symmetric with normal eye closure  CN VIII: Hearing is normal to causal conversation. CN IX, X: Phonation is normal. CN XI: Head turning and shoulder shrug are intact  MOTOR: Upper extremity motor strength is normal, mild bilateral hip flexion weakness, tenderness at bilateral calf muscles upon deep palpitation, no distal leg weakness  REFLEXES: Reflexes are 1 and symmetric at the biceps, triceps, 2+ extensor  bilaterally knees, and ankles. Plantar responses are flexor.  SENSORY: Intact to light touch, pinprick and vibratory sensation are intact in fingers and toes.  COORDINATION: There is no trunk or limb dysmetria noted.  GAIT/STANCE: Need push-up to get up from seated position, wide-based, cautious, mildly unsteady gait  REVIEW OF SYSTEMS:  Full 14 system review of systems  performed and notable only for as above All other review of systems were negative.   ALLERGIES: No Known Allergies  HOME MEDICATIONS: Current Outpatient Medications  Medication Sig Dispense Refill   acetaminophen (TYLENOL) 500 MG tablet Take 1,000 mg by mouth every 6 (six) hours as needed for headache.      aspirin 81 MG tablet Take 81 mg by mouth every morning.      atorvastatin (LIPITOR) 10 MG tablet Take 1 tablet (10 mg total) by mouth daily. 90 tablet 1   Fiber POWD Take by mouth.     gabapentin (NEURONTIN) 100 MG capsule Take 300 mg by mouth at bedtime.     levothyroxine (SYNTHROID) 88 MCG tablet Take 1 tablet (88 mcg total) by mouth daily. 90 tablet 3   LORazepam (ATIVAN) 1 MG tablet TAKE ONE TABLET BY MOUTH EVERY 8 HOURS AS NEEDED FOR ANXIETY 90 tablet 0   Magnesium 250 MG TABS Take 2 tablets by mouth daily.      nystatin cream (MYCOSTATIN) Apply 1 application topically 2 (two) times daily. To left breast fold 30 g 0   omeprazole (PRILOSEC) 40 MG capsule Take 1 capsule (40 mg total) by mouth daily. 90 capsule 1   traMADol (ULTRAM) 50 MG tablet TAKE 1/2 TABLET BY MOUTH DAILY 26 tablet 0   Current Facility-Administered Medications  Medication Dose Route Frequency Provider Last Rate Last Admin   Romosozumab-aqqg (EVENITY) 105 MG/1.17ML injection 210 mg  210 mg Subcutaneous Once Gayland Curry, DO        PAST MEDICAL HISTORY: Past Medical History:  Diagnosis Date   Anxiety    Chest pain 02/08/15   ETT normal   Diverticulosis    Diverticulitis (1982)   Fracture of rib of right side 08/16/2013   GERD (gastroesophageal reflux disease)    Hyperlipidemia, mixed    Hypothyroidism    IBS (irritable bowel syndrome)    Morton's neuroma    Right lumbar radiculopathy    Intermittent x 2 episodes in summer 2014   Shingles 1967 and 2010   Ulcer    Urine incontinence    Vertigo     PAST SURGICAL HISTORY: Past Surgical History:  Procedure Laterality Date   CARDIOVASCULAR STRESS  TEST  02/08/15   ETT normal   CATARACT EXTRACTION  2012   Dr. Herbert Deaner   COLONOSCOPY  2005   [facility] (normal per pt report)   DEXA  11/03/13   Heel T score -0.8.   [place]   right foot   TONSILLECTOMY     TONSILLECTOMY AND ADENOIDECTOMY  1948   Dr. [place]   WISDOM TOOTH EXTRACTION      FAMILY HISTORY: Family History  Problem Relation Age of Onset   Tuberculosis Mother    Pancreatic cancer Father    Asthma Daughter    Heart attack Paternal Grandfather     SOCIAL HISTORY: Social History   Socioeconomic History   Marital status: Single    Spouse name: Not on file   Number of children: Not on file  Years of education: Not on file   Highest education level: Not on file  Occupational History   Occupation: Retired  Tobacco Use   Smoking status: Former    Packs/day: 1.00    Years: 8.00    Pack years: 8.00    Types: Cigarettes    Start date: 06/26/1983   Smokeless tobacco: Never  Vaping Use   Vaping Use: Never used  Substance and Sexual Activity   Alcohol use: No   Drug use: No   Sexual activity: Not Currently  Other Topics Concern   Not on file  Social History Narrative   Divorced, 2 children.   Lives in [place].   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   Tobacco: small amount, distant past.  Alcohol: none.           Social History      Diet? n/a      Do you drink/eat things with caffeine? coffee      Marital status?     Divorced (1984)                              What year were you married? 1958      Do you live in a house, apartment, assisted living, condo, trailer, etc.? house      Is it one or more stories?  yes      How many persons live in your home? Living with daughter and husband at the present      Do you have any pets in your home? (please list) no       Highest level of education completed?  16 years      Current or past profession: teacher grades 1 and 2, education coordinator [employer]       Do you exercise?                   yes                   Type & how often? Walk 1-3 times per week      Advanced Directives      Do you have a living will? yes      Do you have a DNR form?                                  If not, do you want to discuss one?      Do you have signed POA/HPOA for forms? yes      Functional Status      Do you have difficulty bathing or dressing yourself?      Do you have difficulty preparing food or eating?       Do you have difficulty managing your medications?      Do you have difficulty managing your finances?      Do you have difficulty affording your medications?      Social Determinants of Health   Financial Resource Strain: Not on file  Food Insecurity: Not on file  Transportation Needs: Not on file  Physical Activity: Not on file  Stress: Not on file  Social Connections: Not on file  Intimate Partner Violence: Not on file      Marcial Pacas, M.D. Ph.D.  [facility] [address]  [place], [place] [address] Ph: [phone] Fax: [phone]  CC:  Yvonna Alanis, NP [address],  [place] [address]  Yvonna Alanis, NP

## 2021-04-25 NOTE — Telephone Encounter (Signed)
[employer] medicare order sent to GI, they will obtain the auth and reach out to the patient to schedule.  

## 2021-05-01 ENCOUNTER — Other Ambulatory Visit: Payer: Self-pay | Admitting: Orthopedic Surgery

## 2021-05-01 NOTE — Telephone Encounter (Signed)
RX last refilled on 03/28/2021  No treatment agreement on file, notation made on pending appointment in December

## 2021-05-11 ENCOUNTER — Other Ambulatory Visit: Payer: Self-pay | Admitting: Orthopedic Surgery

## 2021-05-15 ENCOUNTER — Ambulatory Visit
Admission: RE | Admit: 2021-05-15 | Discharge: 2021-05-15 | Disposition: A | Discharge status: Home / Self Care | Payer: Medicare HMO | Source: Ambulatory Visit | Attending: Neurology | Admitting: Neurology

## 2021-05-15 DIAGNOSIS — R269 Unspecified abnormalities of gait and mobility: Secondary | ICD-10-CM | POA: Diagnosis not present

## 2021-05-17 ENCOUNTER — Other Ambulatory Visit: Payer: Self-pay | Admitting: Neurology

## 2021-05-17 ENCOUNTER — Telehealth: Payer: Self-pay | Admitting: Neurology

## 2021-05-17 MED ORDER — GABAPENTIN 100 MG PO CAPS
300.0000 mg | ORAL_CAPSULE | Freq: Three times a day (TID) | ORAL | 5 refills | Status: DC
Start: 2021-05-17 — End: 2021-05-17

## 2021-05-17 MED ORDER — DULOXETINE HCL 30 MG PO CPEP: 30.0000 mg | ORAL_CAPSULE | Freq: Every day | ORAL | 5 refills | Status: DC

## 2021-05-17 NOTE — Telephone Encounter (Signed)
Thoracic MRI completed 05/16/21. We will send this to Dr. Krista Blue to review results and address starting a medication.

## 2021-05-17 NOTE — Telephone Encounter (Signed)
I spoke to pharmacy. The patient's insurance plan will only cover three capsules of gabapentin daily (of any strength). She is already taking 100mg , three capsules at bedtime.  Per vo by Dr. Krista Blue, she may continue her current gabapentin 100mg , three capsules at bedtime. She will add duloxetine 30mg , one tablet daily.   I called the patient back and she is agreeable to this plan.

## 2021-05-17 NOTE — Telephone Encounter (Signed)
Pt is asking if Dr Krista Blue has any suggestions on what she can do re: back and nerve pain issues while waiting on results to MRI

## 2021-05-17 NOTE — Addendum Note (Signed)
Addended by: Noberto Retort C on: 05/17/2021 05:19 PM   Modules accepted: Orders

## 2021-05-17 NOTE — Addendum Note (Signed)
Addended by: Desmond Lope on: 05/17/2021 03:36 PM   Modules accepted: Orders

## 2021-05-17 NOTE — Telephone Encounter (Signed)
The patient is agreeable to try the increase dose of gabapentin. Per vo by Dr. Krista Blue, okay to provide rx for gabapentin 100mg , three capsules TID. The patient is aware she can take less, if she is her pain is more tolerable on some days.

## 2021-05-17 NOTE — Telephone Encounter (Signed)
  IMPRESSION: This MRI of the thoracic spine without contrast shows the following: 1.   The spinal cord appears normal.  There is no spinal stenosis. 2.   Chronic 50% compression fracture of the T8 vertebral body.  This can be seen on the chest x-ray from 01/24/2019 though the compression fracture appeared milder at that time. 3.   Mild degenerative changes at T8-T9, T11-T12 and T12-L1 do not lead to nerve root compression or spinal stenosis.  Please call patient, MRI of thoracic spine showed chronic compression fracture of T8, 50% compression, no evidence of spinal cord compression  The fracture from history happened in September 2020, for the right lower back radicular pain, may consider higher dose of gabapentin,

## 2021-05-22 ENCOUNTER — Other Ambulatory Visit: Payer: Self-pay | Admitting: Nurse Practitioner

## 2021-05-22 NOTE — Telephone Encounter (Signed)
Patient has request refill on medication "Atorvastatin". Patient hasn't had Lipid panel in 6 years. Patient medication pend and sent to PCP Yvonna Alanis, NP for approval.

## 2021-05-23 ENCOUNTER — Other Ambulatory Visit: Payer: Self-pay | Admitting: Orthopedic Surgery

## 2021-05-23 DIAGNOSIS — M5442 Lumbago with sciatica, left side: Secondary | ICD-10-CM

## 2021-05-23 DIAGNOSIS — G8929 Other chronic pain: Secondary | ICD-10-CM

## 2021-05-23 NOTE — Telephone Encounter (Signed)
Patient has request refill on medication "Tramadol". Patient medication last refilled 04/04/2021. Patient has Non opioid contract on file dated 12/08/2020. Patient medication pend and sent to PCP Yvonna Alanis, NP for approval. Please Advise.

## 2021-05-24 ENCOUNTER — Other Ambulatory Visit: Payer: Self-pay | Admitting: Orthopedic Surgery

## 2021-06-12 ENCOUNTER — Ambulatory Visit: Payer: Medicare HMO

## 2021-06-12 ENCOUNTER — Other Ambulatory Visit: Payer: Medicare HMO

## 2021-06-13 ENCOUNTER — Other Ambulatory Visit: Payer: Medicare HMO

## 2021-06-13 ENCOUNTER — Ambulatory Visit: Payer: Medicare HMO

## 2021-06-15 ENCOUNTER — Ambulatory Visit: Payer: Medicare HMO | Admitting: Orthopedic Surgery

## 2021-06-17 ENCOUNTER — Other Ambulatory Visit: Payer: Self-pay | Admitting: Neurology

## 2021-06-28 ENCOUNTER — Other Ambulatory Visit: Payer: Self-pay

## 2021-06-28 ENCOUNTER — Other Ambulatory Visit: Payer: Medicare HMO

## 2021-06-28 ENCOUNTER — Ambulatory Visit (INDEPENDENT_AMBULATORY_CARE_PROVIDER_SITE_OTHER): Payer: Medicare HMO

## 2021-06-28 DIAGNOSIS — I10 Essential (primary) hypertension: Secondary | ICD-10-CM

## 2021-06-28 DIAGNOSIS — E039 Hypothyroidism, unspecified: Secondary | ICD-10-CM

## 2021-06-28 DIAGNOSIS — M810 Age-related osteoporosis without current pathological fracture: Secondary | ICD-10-CM

## 2021-06-28 DIAGNOSIS — E782 Mixed hyperlipidemia: Secondary | ICD-10-CM

## 2021-06-28 MED ORDER — DENOSUMAB 60 MG/ML ~~LOC~~ SOSY
60.0000 mg | PREFILLED_SYRINGE | Freq: Once | SUBCUTANEOUS | Status: AC
  Administered 2021-06-28: 60 mg via SUBCUTANEOUS

## 2021-06-29 ENCOUNTER — Ambulatory Visit (INDEPENDENT_AMBULATORY_CARE_PROVIDER_SITE_OTHER): Payer: Medicare HMO | Admitting: Orthopedic Surgery

## 2021-06-29 ENCOUNTER — Encounter: Payer: Self-pay | Admitting: Orthopedic Surgery

## 2021-06-29 ENCOUNTER — Other Ambulatory Visit: Payer: Medicare HMO

## 2021-06-29 VITALS — BP 138/80 | HR 79 | Temp 97.1°F | Ht 65.0 in | Wt 167.5 lb

## 2021-06-29 DIAGNOSIS — G8929 Other chronic pain: Secondary | ICD-10-CM

## 2021-06-29 DIAGNOSIS — M5442 Lumbago with sciatica, left side: Secondary | ICD-10-CM

## 2021-06-29 DIAGNOSIS — I10 Essential (primary) hypertension: Secondary | ICD-10-CM

## 2021-06-29 DIAGNOSIS — K219 Gastro-esophageal reflux disease without esophagitis: Secondary | ICD-10-CM

## 2021-06-29 DIAGNOSIS — F419 Anxiety disorder, unspecified: Secondary | ICD-10-CM

## 2021-06-29 DIAGNOSIS — G603 Idiopathic progressive neuropathy: Secondary | ICD-10-CM

## 2021-06-29 DIAGNOSIS — E782 Mixed hyperlipidemia: Secondary | ICD-10-CM

## 2021-06-29 DIAGNOSIS — E039 Hypothyroidism, unspecified: Secondary | ICD-10-CM | POA: Diagnosis not present

## 2021-06-29 DIAGNOSIS — M810 Age-related osteoporosis without current pathological fracture: Secondary | ICD-10-CM | POA: Diagnosis not present

## 2021-06-29 DIAGNOSIS — M5441 Lumbago with sciatica, right side: Secondary | ICD-10-CM | POA: Diagnosis not present

## 2021-06-29 LAB — BASIC METABOLIC PANEL
BUN/Creatinine Ratio: 20 (calc) (ref 6–22)
BUN: 20 mg/dL (ref 7–25)
CO2: 24 mmol/L (ref 20–32)
Calcium: 9 mg/dL (ref 8.6–10.4)
Chloride: 104 mmol/L (ref 98–110)
Creat: 1.02 mg/dL — ABNORMAL HIGH (ref 0.60–0.95)
Glucose, Bld: 87 mg/dL (ref 65–99)
Potassium: 4.9 mmol/L (ref 3.5–5.3)
Sodium: 138 mmol/L (ref 135–146)

## 2021-06-29 LAB — HEPATIC FUNCTION PANEL
AG Ratio: 1.4 (calc) (ref 1.0–2.5)
ALT: 15 U/L (ref 6–29)
AST: 17 U/L (ref 10–35)
Albumin: 4 g/dL (ref 3.6–5.1)
Alkaline phosphatase (APISO): 68 U/L (ref 37–153)
Bilirubin, Direct: 0.1 mg/dL (ref 0.0–0.2)
Globulin: 2.8 g/dL (calc) (ref 1.9–3.7)
Indirect Bilirubin: 0.4 mg/dL (calc) (ref 0.2–1.2)
Total Bilirubin: 0.5 mg/dL (ref 0.2–1.2)
Total Protein: 6.8 g/dL (ref 6.1–8.1)

## 2021-06-29 LAB — CBC WITH DIFFERENTIAL/PLATELET
Absolute Monocytes: 621 cells/uL (ref 200–950)
Basophils Absolute: 29 cells/uL (ref 0–200)
Basophils Relative: 0.5 %
Eosinophils Absolute: 467 cells/uL (ref 15–500)
Eosinophils Relative: 8.2 %
HCT: 43.8 % (ref 35.0–45.0)
Hemoglobin: 14 g/dL (ref 11.7–15.5)
Lymphs Abs: 2092 cells/uL (ref 850–3900)
MCH: 29 pg (ref 27.0–33.0)
MCHC: 32 g/dL (ref 32.0–36.0)
MCV: 90.9 fL (ref 80.0–100.0)
MPV: 10.8 fL (ref 7.5–12.5)
Monocytes Relative: 10.9 %
Neutro Abs: 2491 cells/uL (ref 1500–7800)
Neutrophils Relative %: 43.7 %
Platelets: 254 10*3/uL (ref 140–400)
RBC: 4.82 10*6/uL (ref 3.80–5.10)
RDW: 13.1 % (ref 11.0–15.0)
Total Lymphocyte: 36.7 %
WBC: 5.7 10*3/uL (ref 3.8–10.8)

## 2021-06-29 LAB — LIPID PANEL
Cholesterol: 152 mg/dL (ref <200)
HDL: 59 mg/dL (ref >=50)
LDL Cholesterol (Calc): 75 mg/dL (calc)
Non-HDL Cholesterol (Calc): 93 mg/dL (calc) (ref <130)
Total CHOL/HDL Ratio: 2.6 (calc) (ref <5.0)
Triglycerides: 98 mg/dL (ref <150)

## 2021-06-29 LAB — TSH: TSH: 4.95 mIU/L — ABNORMAL HIGH (ref 0.40–4.50)

## 2021-06-29 MED ORDER — LEVOTHYROXINE SODIUM 100 MCG PO TABS: 100.0000 ug | ORAL_TABLET | Freq: Every day | ORAL | 3 refills | Status: DC

## 2021-06-29 MED ORDER — OMEPRAZOLE 40 MG PO CPDR: 40.0000 mg | DELAYED_RELEASE_CAPSULE | Freq: Every day | ORAL | 1 refills | Status: DC

## 2021-06-29 NOTE — Progress Notes (Signed)
Careteam: Patient Care Team: Julie Alanis, NP as PCP - General (Adult Health Nurse Practitioner) Monna Fam, MD as Consulting Physician (Ophthalmology) Gerarda Fraction, MD as Referring Physician (Ophthalmology) Carole Civil, MD as Consulting Physician (Orthopedic Surgery)  Seen by: Windell Moulding, AGNP-C  PLACE OF SERVICE:  [facility] Directive information    No Known Allergies  Chief Complaint  Patient presents with   Medical Management of Chronic Issues    Patient presents today for a 6 month follow-up.   Quality Metric Gaps    Zoster, Flu, COVID booster     HPI: Patient is a 88 y.o. female seen today for medical management of chronic conditions.   Daughter present during encounter.   Labs discussed with patient.   Thyroid level elevated. She is taking levothyroxine every morning on empty stomach. Reports some increased fatigue.   Prolia injection 06/28/2021. Tolerated well.   Continues to have ongoing back pain and peripheral neuropathy. Followed by neurology 04/2021. Gabapentin increased to 300 mg 3x daily. She is not taking it 3x daily. Still taking Tramadol and tylenol.   Anxiety- no recent panic attacks, continues to take ativan prn.   No bowel issues. Takes metamucil daily.   Eating well. Up 2 lbs from last visit. Some heartburn keeping her up at night. Would like to try medication.   No recent falls.   Not interested in Shingrix vaccine at this time.       Review of Systems:  Review of Systems  Constitutional:  Positive for malaise/fatigue. Negative for chills, fever and weight loss.  HENT:  Positive for hearing loss. Negative for sore throat.   Eyes:  Negative for blurred vision and double vision.  Respiratory:  Negative for cough, shortness of breath and wheezing.   Cardiovascular:  Negative for chest pain and leg swelling.  Gastrointestinal:  Negative for abdominal pain, blood in stool, constipation, diarrhea, heartburn,  nausea and vomiting.  Genitourinary:  Negative for dysuria, frequency and hematuria.  Musculoskeletal:  Positive for back pain, joint pain and myalgias. Negative for falls.  Neurological:  Positive for tingling. Negative for dizziness, weakness and headaches.  Psychiatric/Behavioral:  Negative for depression and memory loss. The patient is not nervous/anxious and does not have insomnia.    Past Medical History:  Diagnosis Date   Anxiety    Chest pain 02/08/15   ETT normal   Diverticulosis    Diverticulitis (1982)   Fracture of rib of right side 08/16/2013   GERD (gastroesophageal reflux disease)    Hyperlipidemia, mixed    Hypothyroidism    IBS (irritable bowel syndrome)    Morton's neuroma    Right lumbar radiculopathy    Intermittent x 2 episodes in summer 2014   Shingles 1967 and 2010   Ulcer    Urine incontinence    Vertigo    Past Surgical History:  Procedure Laterality Date   CARDIOVASCULAR STRESS TEST  02/08/15   ETT normal   CATARACT EXTRACTION  2012   Dr. Herbert Deaner   COLONOSCOPY  2005   Dr. Arsenio [facility]er at [facility] (normal per pt report)   DEXA  11/03/13   Heel T score -0.8.   [place]   right foot   TONSILLECTOMY     TONSILLECTOMY AND ADENOIDECTOMY  1948   Dr. [place]   WISDOM TOOTH EXTRACTION     Social History:   reports that she has  quit smoking. Her smoking use included cigarettes. She started smoking about 38 years ago. She has a 8.00 pack-year smoking history. She has never used smokeless tobacco. She reports that she does not drink alcohol and does not use drugs.  Family History  Problem Relation Age of Onset   Tuberculosis Mother    Pancreatic cancer Father    Asthma Daughter    Heart attack Paternal Grandfather     Medications: Patient's Medications  New Prescriptions   No medications on file  Previous Medications   ACETAMINOPHEN (TYLENOL) 500 MG TABLET    Take 1,000 mg by mouth every 6 (six)  hours as needed for headache.    ASPIRIN 81 MG TABLET    Take 81 mg by mouth every morning.    ATORVASTATIN (LIPITOR) 10 MG TABLET    TAKE ONE TABLET BY MOUTH ONCE DAILY   DULOXETINE (CYMBALTA) 30 MG CAPSULE    Take 1 capsule (30 mg total) by mouth daily.   FIBER POWD    Take by mouth.   GABAPENTIN (NEURONTIN) 100 MG CAPSULE    Take 300 mg by mouth 3 (three) times daily.   LEVOTHYROXINE (SYNTHROID) 88 MCG TABLET    Take 1 tablet (88 mcg total) by mouth daily.   LORAZEPAM (ATIVAN) 1 MG TABLET    TAKE ONE TABLET BY MOUTH EVERY 8 HOURS AS NEEDED FOR ANXIETY   MAGNESIUM 250 MG TABS    Take 2 tablets by mouth daily.    NYSTATIN CREAM (MYCOSTATIN)    Apply 1 application topically 2 (two) times daily. To left breast fold   OMEPRAZOLE (PRILOSEC) 40 MG CAPSULE    TAKE ONE CAPSULE BY MOUTH ONCE DAILY   TRAMADOL (ULTRAM) 50 MG TABLET    TAKE 1/2 TABLET BY MOUTH EVERY DAY  Modified Medications   No medications on file  Discontinued Medications   No medications on file    Physical Exam:  Vitals:   06/29/21 1519  BP: 138/80  Pulse: 79  Temp: (!) 97.1 F (36.2 C)  SpO2: 98%  Weight: 167 lb 8 oz (76 kg)  Height: 5\' 5"  (1.651 m)   Body mass index is 27.87 kg/m. Wt Readings from Last 3 Encounters:  06/29/21 167 lb 8 oz (76 kg)  04/25/21 165 lb (74.8 kg)  12/30/20 160 lb 1.6 oz (72.6 kg)    Physical Exam Vitals reviewed.  Constitutional:      General: She is not in acute distress. HENT:     Head: Normocephalic.  Eyes:     General:        Right eye: No discharge.        Left eye: No discharge.  Neck:     Vascular: No carotid bruit.  Cardiovascular:     Rate and Rhythm: Normal rate and regular rhythm.     Pulses: Normal pulses.     Heart sounds: Normal heart sounds. No murmur heard. Pulmonary:     Effort: Pulmonary effort is normal. No respiratory distress.     Breath sounds: Normal breath sounds. No wheezing.  Abdominal:     General: Bowel sounds are normal. There is no  distension.     Palpations: Abdomen is soft.     Tenderness: There is no abdominal tenderness.  Musculoskeletal:     Cervical back: Normal range of motion.     Right lower leg: No edema.     Left lower leg: No edema.  Lymphadenopathy:     Cervical: No cervical  adenopathy.  Skin:    General: Skin is warm and dry.     Capillary Refill: Capillary refill takes less than 2 seconds.  Neurological:     General: No focal deficit present.     Mental Status: She is alert and oriented to person, place, and time.     Motor: No weakness.     Gait: Gait abnormal.  Psychiatric:        Mood and Affect: Mood normal.        Behavior: Behavior normal.    Labs reviewed: Basic Metabolic Panel: Recent Labs    08/15/20 1236 06/28/21 0841  NA 137 138  K 5.2 4.9  CL 103 104  CO2 27 24  GLUCOSE 98 87  BUN 14 20  CREATININE 0.76 1.02*  CALCIUM 9.4 9.0  TSH  --  4.95*   Liver Function Tests: Recent Labs    08/15/20 1236 06/28/21 0841  AST 15 17  ALT 13 15  BILITOT 0.5 0.5  PROT 7.0 6.8   No results for input(s): LIPASE, AMYLASE in the last 8760 hours. No results for input(s): AMMONIA in the last 8760 hours. CBC: Recent Labs    08/15/20 1236 06/28/21 0841  WBC 6.3 5.7  NEUTROABS 3,446 2,491  HGB 14.2 14.0  HCT 43.2 43.8  MCV 89.6 90.9  PLT 277 254   Lipid Panel: Recent Labs    06/28/21 0841  CHOL 152  HDL 59  LDLCALC 75  TRIG 98  CHOLHDL 2.6   TSH: Recent Labs    06/28/21 0841  TSH 4.95*   A1C: Lab Results  Component Value Date   HGBA1C 5.6 08/15/2020     Assessment/Plan 1. Acquired hypothyroidism - TSH 4.95 06/28/2021 - levothyroxine (SYNTHROID) 100 MCG tablet; Take 1 tablet (100 mcg total) by mouth daily.  Dispense: 90 tablet; Refill: 3 - TSH; Future  2. Chronic midline low back pain with bilateral sciatica - ongoing - recommend light walking - cont Tramadol and tylenol  3. Senile osteoporosis - Bone density- t score -2.8 2020 - cont Prolia -  DEXA- future   4. Primary hypertension - BUN/creat 20/1.02 06/28/2021 - controlled without medication  5. Mixed hyperlipidemia - LDL 75 06/28/2021 - cont Lipitor  6. Idiopathic progressive neuropathy - followed by neurology - advised to increase gabapentin to 300 mg 3x/daily - recommend trying gabapentin 300 mg bid, then taper up to 3x/day  7. GERD - hgb 14.0 06/28/2021 - omeprazole (PRILOSEC) 40 MG capsule; Take 1 capsule (40 mg total) by mouth daily.  Dispense: 90 capsule; Refill: 1  8. Anxiety - no recent panic attacks - cont ativan prn  Total time: 34 minutes. Greater than 50% of total time spent discussing health maintenance, osteoporosis, lab work and symptom/medication management.    Next appt: Visit date not found  [place], [facility] [phone]

## 2021-06-29 NOTE — Patient Instructions (Addendum)
Try gabapentin twice daily  Keep moving moving- light walking helps back pain  Consider taking Tramadol in the morning to help with pain from being active.   Try salonopas patches  Please schedule lab visit to recheck TSH in 6 weeks

## 2021-08-08 ENCOUNTER — Other Ambulatory Visit: Payer: Self-pay

## 2021-08-08 DIAGNOSIS — E039 Hypothyroidism, unspecified: Secondary | ICD-10-CM

## 2021-08-09 ENCOUNTER — Other Ambulatory Visit: Payer: Medicare HMO

## 2021-08-09 ENCOUNTER — Other Ambulatory Visit: Payer: Self-pay

## 2021-08-09 LAB — TSH: TSH: 1.24 mIU/L (ref 0.40–4.50)

## 2021-08-22 ENCOUNTER — Other Ambulatory Visit: Payer: Self-pay | Admitting: Orthopedic Surgery

## 2021-08-30 ENCOUNTER — Encounter (INDEPENDENT_AMBULATORY_CARE_PROVIDER_SITE_OTHER): Payer: Medicare HMO | Admitting: Ophthalmology

## 2021-09-06 ENCOUNTER — Encounter (INDEPENDENT_AMBULATORY_CARE_PROVIDER_SITE_OTHER): Payer: Medicare HMO | Admitting: Ophthalmology

## 2021-09-12 ENCOUNTER — Ambulatory Visit: Payer: Medicare HMO | Admitting: Neurology

## 2021-09-14 ENCOUNTER — Other Ambulatory Visit: Payer: Self-pay

## 2021-09-14 ENCOUNTER — Ambulatory Visit (INDEPENDENT_AMBULATORY_CARE_PROVIDER_SITE_OTHER): Payer: Medicare HMO

## 2021-09-14 ENCOUNTER — Ambulatory Visit
Admission: RE | Admit: 2021-09-14 | Discharge: 2021-09-14 | Disposition: A | Discharge status: Home / Self Care | Payer: Medicare HMO | Source: Ambulatory Visit | Attending: Urgent Care | Admitting: Urgent Care

## 2021-09-14 VITALS — BP 155/81 | HR 91 | Temp 98.4°F | Resp 18 | Ht 64.0 in | Wt 173.0 lb

## 2021-09-14 DIAGNOSIS — M79661 Pain in right lower leg: Secondary | ICD-10-CM | POA: Insufficient documentation

## 2021-09-14 DIAGNOSIS — G6289 Other specified polyneuropathies: Secondary | ICD-10-CM | POA: Diagnosis present

## 2021-09-14 DIAGNOSIS — M7989 Other specified soft tissue disorders: Secondary | ICD-10-CM | POA: Insufficient documentation

## 2021-09-14 DIAGNOSIS — R509 Fever, unspecified: Secondary | ICD-10-CM

## 2021-09-14 DIAGNOSIS — R0602 Shortness of breath: Secondary | ICD-10-CM

## 2021-09-14 DIAGNOSIS — M545 Low back pain, unspecified: Secondary | ICD-10-CM | POA: Insufficient documentation

## 2021-09-14 DIAGNOSIS — M79662 Pain in left lower leg: Secondary | ICD-10-CM | POA: Diagnosis present

## 2021-09-14 DIAGNOSIS — M549 Dorsalgia, unspecified: Secondary | ICD-10-CM | POA: Diagnosis not present

## 2021-09-14 DIAGNOSIS — R350 Frequency of micturition: Secondary | ICD-10-CM | POA: Insufficient documentation

## 2021-09-14 LAB — POCT URINALYSIS DIP (MANUAL ENTRY)
Bilirubin, UA: NEGATIVE
Blood, UA: NEGATIVE
Glucose, UA: NEGATIVE mg/dL
Ketones, POC UA: NEGATIVE mg/dL
Leukocytes, UA: NEGATIVE
Nitrite, UA: NEGATIVE
Protein Ur, POC: NEGATIVE mg/dL
Spec Grav, UA: 1.025 (ref 1.010–1.025)
Urobilinogen, UA: 0.2 E.U./dL
pH, UA: 5 (ref 5.0–8.0)

## 2021-09-14 MED ORDER — ACETAMINOPHEN 325 MG PO TABS: 650.0000 mg | ORAL_TABLET | Freq: Four times a day (QID) | ORAL | 0 refills | Status: AC | PRN

## 2021-09-14 MED ORDER — METHOCARBAMOL 500 MG PO TABS
500.0000 mg | ORAL_TABLET | Freq: Two times a day (BID) | ORAL | 0 refills | Status: DC
Start: 2021-09-14 — End: 2021-10-05

## 2021-09-14 NOTE — ED Provider Notes (Signed)
?[place] ? ? ?MRN: [id number] DOB: 12/15/1933 ? ?Subjective:  ? ?Julie Park is a 88 y.o. female presenting for multiple complaints.  Patient reports that this past week she has had urinary frequency, low-grade fevers, low back pain on both sides but worse on the right side, shortness of breath, bilateral lower leg swelling that is worse at night and feels like her neuropathy is acting up.  No chest pain, history of CHF.  No history of clotting disorders.  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  No history of diabetes.  Patient is supposed to wear compression socks but she does not.  She also has a history of right-sided lumbar radiculopathy.  Does not describe any shooting pains.  Has a history of a thoracic compression fracture. ? ? ?Current Facility-Administered Medications:  ?  Romosozumab-aqqg (EVENITY) 105 MG/1.17ML injection 210 mg, 210 mg, Subcutaneous, Once, Reed, Tiffany L, DO ? ?Current Outpatient Medications:  ?  acetaminophen (TYLENOL) 500 MG tablet, Take 1,000 mg by mouth every 6 (six) hours as needed for headache. , Disp: , Rfl:  ?  aspirin 81 MG tablet, Take 81 mg by mouth every morning. , Disp: , Rfl:  ?  atorvastatin (LIPITOR) 10 MG tablet, TAKE ONE TABLET BY MOUTH ONCE DAILY, Disp: 90 tablet, Rfl: 1 ?  DULoxetine (CYMBALTA) 30 MG capsule, Take 1 capsule (30 mg total) by mouth daily., Disp: 90 capsule, Rfl: 1 ?  Fiber POWD, Take by mouth., Disp: , Rfl:  ?  gabapentin (NEURONTIN) 100 MG capsule, Take 300 mg by mouth 3 (three) times daily., Disp: , Rfl:  ?  levothyroxine (SYNTHROID) 100 MCG tablet, Take 1 tablet (100 mcg total) by mouth daily., Disp: 90 tablet, Rfl: 3 ?  LORazepam (ATIVAN) 1 MG tablet, TAKE ONE TABLET BY MOUTH EVERY 8 HOURS AS NEEDED FOR ANXIETY, Disp: 90 tablet, Rfl: 5 ?  Magnesium 250 MG TABS, Take 2 tablets by mouth daily. , Disp: , Rfl:  ?  nystatin cream (MYCOSTATIN), Apply 1 application topically 2 (two) times daily. To left  breast fold, Disp: 30 g, Rfl: 0 ?  omeprazole (PRILOSEC) 40 MG capsule, Take 1 capsule (40 mg total) by mouth daily., Disp: 90 capsule, Rfl: 1 ?  traMADol (ULTRAM) 50 MG tablet, TAKE 1/2 TABLET BY MOUTH EVERY DAY, Disp: 26 tablet, Rfl: 5  ? ?No Known Allergies ? ?Past Medical History:  ?Diagnosis Date  ? Anxiety   ? Chest pain 02/08/15  ? ETT normal  ? Diverticulosis   ? Diverticulitis (1982)  ? Fracture of rib of right side 08/16/2013  ? GERD (gastroesophageal reflux disease)   ? Hyperlipidemia, mixed   ? Hypothyroidism   ? IBS (irritable bowel syndrome)   ? Morton's neuroma   ? Right lumbar radiculopathy   ? Intermittent x 2 episodes in summer 2014  ? Shingles 1967 and 2010  ? Ulcer   ? Urine incontinence   ? Vertigo   ?  ? ?Past Surgical History:  ?Procedure Laterality Date  ? CARDIOVASCULAR STRESS TEST  02/08/15  ? ETT normal  ? CATARACT EXTRACTION  2012  ? Dr. Herbert Deaner  ? COLONOSCOPY  2005  ? Dr. Arsenio Loader at [facility] (normal per pt report)  ? DEXA  11/03/13  ? Heel T score -0.8.  ? [place]  ? right foot  ? TONSILLECTOMY    ? [place]  ? Dr. Reece Leader [facility]  ?  TUBAL LIGATION  1974  ? WISDOM TOOTH EXTRACTION    ? ? ?Family History  ?Problem Relation Age of Onset  ? Tuberculosis Mother   ? Pancreatic cancer Father   ? Asthma Daughter   ? Heart attack Paternal Grandfather   ? ? ?Social History  ? ?Tobacco Use  ? Smoking status: Former  ?  Packs/day: 1.00  ?  Years: 8.00  ?  Pack years: 8.00  ?  Types: Cigarettes  ?  Start date: 06/26/1983  ? Smokeless tobacco: Never  ?Vaping Use  ? Vaping Use: Never used  ?Substance Use Topics  ? Alcohol use: No  ? Drug use: No  ? ? ?ROS ? ? ?Objective:  ? ?Vitals: ?BP (!) 155/81 (BP Location: Right Arm)   Pulse 91   Temp 98.4 ?F (36.9 ?C) (Oral)   Resp 18   Ht '5\' 4"'$  (1.626 m)   Wt 173 lb (78.5 kg)   SpO2 92%   BMI 29.70 kg/m?  ? ?Wt Readings from Last 3 Encounters:  ?09/14/21 173 lb (78.5 kg)  ?06/29/21 167 lb 8 oz (76 kg)   ?04/25/21 165 lb (74.8 kg)  ? ?Temp Readings from Last 3 Encounters:  ?09/14/21 98.4 ?F (36.9 ?C) (Oral)  ?06/29/21 (!) 97.1 ?F (36.2 ?C)  ?12/30/20 (!) 96.9 ?F (36.1 ?C)  ? ?BP Readings from Last 3 Encounters:  ?09/14/21 (!) 155/81  ?06/29/21 138/80  ?04/25/21 (!) 147/74  ? ?Pulse Readings from Last 3 Encounters:  ?09/14/21 91  ?06/29/21 79  ?04/25/21 79  ? ?Physical Exam ?Constitutional:   ?   General: She is not in acute distress. ?   Appearance: Normal appearance. She is well-developed. She is not ill-appearing, toxic-appearing or diaphoretic.  ?HENT:  ?   Head: Normocephalic and atraumatic.  ?   Right Ear: External ear normal.  ?   Left Ear: External ear normal.  ?   Nose: Nose normal.  ?   Mouth/Throat:  ?   Mouth: Mucous membranes are moist.  ?Eyes:  ?   General: No scleral icterus.    ?   Right eye: No discharge.     ?   Left eye: No discharge.  ?   Extraocular Movements: Extraocular movements intact.  ?   Conjunctiva/sclera: Conjunctivae normal.  ?Cardiovascular:  ?   Rate and Rhythm: Normal rate.  ?   Heart sounds: No murmur heard. ?  No friction rub. No gallop.  ?Pulmonary:  ?   Effort: Pulmonary effort is normal. No respiratory distress.  ?   Breath sounds: No stridor. No wheezing, rhonchi or rales.  ?Chest:  ?   Chest wall: No tenderness.  ?Abdominal:  ?   General: Bowel sounds are normal. There is no distension.  ?   Palpations: Abdomen is soft. There is no mass.  ?   Tenderness: There is no abdominal tenderness. There is no right CVA tenderness, left CVA tenderness, guarding or rebound.  ?Musculoskeletal:     ?   General: Tenderness (superficial worse over the anterior portion of both of her lower extremities) present. No swelling.  ?   Right lower leg: No edema.  ?   Left lower leg: No edema.  ?Skin: ?   General: Skin is warm and dry.  ?   Findings: No erythema or rash.  ?Neurological:  ?   General: No focal deficit present.  ?   Mental Status: She is alert and oriented to person, place, and time.   ?  Motor: No weakness.  ?   Coordination: Coordination normal.  ?   Gait: Gait normal.  ?   Deep Tendon Reflexes: Reflexes normal.  ?Psychiatric:     ?   Mood and Affect: Mood normal.     ?   Behavior: Behavior normal.     ?   Thought Content: Thought content normal.     ?   Judgment: Judgment normal.  ? ? ?Results for orders placed or performed during the hospital encounter of 09/14/21 (from the past 24 hour(s))  ?POCT urinalysis dipstick     Status: None  ? Collection Time: 09/14/21  1:46 PM  ?Result Value Ref Range  ? Color, UA yellow yellow  ? Clarity, UA clear clear  ? Glucose, UA negative negative mg/dL  ? Bilirubin, UA negative negative  ? Ketones, POC UA negative negative mg/dL  ? Spec Grav, UA 1.025 1.010 - 1.025  ? Blood, UA negative negative  ? pH, UA 5.0 5.0 - 8.0  ? Protein Ur, POC negative negative mg/dL  ? Urobilinogen, UA 0.2 0.2 or 1.0 E.U./dL  ? Nitrite, UA Negative Negative  ? Leukocytes, UA Negative Negative  ? ?DG Chest 2 View ? ?Result Date: 09/14/2021 ?CLINICAL DATA:  Shortness of breath. Back pain and fever over the last week. EXAM: CHEST - 2 VIEW COMPARISON:  01/24/2019 FINDINGS: Heart size is normal. Age related aortic atherosclerotic calcification is seen. Lungs are clear. No infiltrate, collapse or effusion. Old healed rib fractures on the right. There is an old midthoracic compression fracture, but it shows more loss of height than was seen in 2020. The appearance is similar to the MRI scan from November of 2022, without progression since that time. IMPRESSION: No active cardiopulmonary disease. Old thoracic compression fracture. Old right rib fractures. Aortic atherosclerosis. Electronically Signed   By: Nelson Chimes M.D.   On: 09/14/2021 14:17   ? ? ?Assessment and Plan :  ? ?PDMP not reviewed this encounter. ? ?1. Acute bilateral low back pain without sciatica   ?2. Urinary frequency   ?3. Pain and swelling of right lower leg   ?4. Pain and swelling of lower leg, left   ?5. Shortness  of breath   ?6. Other polyneuropathy   ? ?Chest x-ray is negative.  I do not suspect the patient is having a pulmonary embolism but advised that if she develops worsening shortness of breath, chest pain and a fast

## 2021-09-14 NOTE — Discharge Instructions (Addendum)
Use compression socks of about 69mHg of pressure. Follow up with your regular doctor on your chronic conditions including consideration for a recheck on your blood work.  ?

## 2021-09-14 NOTE — ED Triage Notes (Addendum)
Pt reports increased urinary frequency, low grade fevers, lower right back pain, nausea since Monday.  ? ?Pt also reports bilateral ankle swelling and 5 lbs weight gain since January.  ?

## 2021-09-16 LAB — URINE CULTURE

## 2021-10-04 ENCOUNTER — Encounter: Payer: Self-pay | Admitting: Orthopedic Surgery

## 2021-10-04 ENCOUNTER — Other Ambulatory Visit (HOSPITAL_COMMUNITY): Payer: Self-pay | Admitting: Orthopedic Surgery

## 2021-10-04 DIAGNOSIS — Z1231 Encounter for screening mammogram for malignant neoplasm of breast: Secondary | ICD-10-CM

## 2021-10-05 ENCOUNTER — Ambulatory Visit (INDEPENDENT_AMBULATORY_CARE_PROVIDER_SITE_OTHER): Payer: Medicare HMO | Admitting: Orthopedic Surgery

## 2021-10-05 ENCOUNTER — Encounter: Payer: Self-pay | Admitting: Orthopedic Surgery

## 2021-10-05 VITALS — BP 140/86 | HR 76 | Temp 97.5°F | Ht 64.0 in | Wt 171.0 lb

## 2021-10-05 DIAGNOSIS — M5442 Lumbago with sciatica, left side: Secondary | ICD-10-CM

## 2021-10-05 DIAGNOSIS — R600 Localized edema: Secondary | ICD-10-CM | POA: Diagnosis not present

## 2021-10-05 DIAGNOSIS — M5441 Lumbago with sciatica, right side: Secondary | ICD-10-CM

## 2021-10-05 DIAGNOSIS — I10 Essential (primary) hypertension: Secondary | ICD-10-CM | POA: Diagnosis not present

## 2021-10-05 DIAGNOSIS — E039 Hypothyroidism, unspecified: Secondary | ICD-10-CM

## 2021-10-05 DIAGNOSIS — R519 Headache, unspecified: Secondary | ICD-10-CM

## 2021-10-05 DIAGNOSIS — R0602 Shortness of breath: Secondary | ICD-10-CM

## 2021-10-05 DIAGNOSIS — G8929 Other chronic pain: Secondary | ICD-10-CM

## 2021-10-05 MED ORDER — FUROSEMIDE 20 MG PO TABS: 20.0000 mg | ORAL_TABLET | Freq: Every day | ORAL | 3 refills | Status: DC | PRN

## 2021-10-05 NOTE — Patient Instructions (Addendum)
Wear compression stockings daily ? ?Limit salt in diet ? ?Elevated legs in recliner 2-4 hours daily ? ?Take mild walks daily  ? ?Furosemide prescribed- diuretic/ will make you urinate A LOT! Only use if ankle edema is bad, take in the morning, contact provider if you have to take medication > 3 days ? ?Take Xyzal every night x 2 weeks for allergy headaches ? ?Eat 2 good meals a day- make sure meals include calories, protein, fruit and vegetable ?

## 2021-10-05 NOTE — Progress Notes (Signed)
? ? ?Careteam: ?Patient Care Team: ?Yvonna Alanis, NP as PCP - General (Adult Health Nurse Practitioner) ?Monna Fam, MD as Consulting Physician (Ophthalmology) ?Gerarda Fraction, MD as Referring Physician (Ophthalmology) ?Carole Civil, MD as Consulting Physician (Orthopedic Surgery) ? ?Seen by: Windell Moulding, AGNP-C ? ?PLACE OF SERVICE:  ?[facility]  ?Advanced Directive information ?Does Patient Have a Medical Advance Directive?: Yes, Type of Advance Directive: Living will, Does patient want to make changes to medical advance directive?: No - Patient declined ? ?No Known Allergies ? ?Chief Complaint  ?Patient presents with  ? Acute Visit  ?  Discuss weight going up and down, blood pressure issues, and increased edema in both legs in pm daily.   ? ? ? ?HPI: Patient is a 88 y.o. female seen today for acute visit due to lower leg edema.  ? ?Daughter present during encounter .  ? ?Recently seen by ED 09/14/2021 for sob, ankle edema and back pain. CXR unremarkable. She was given tylenol and robaxin for back pain.  ? ?She reports increased sob with exertion and ankle edema today. She has not been wearing her ted hose. Does not always elevated legs. Daughter reports she is not very active due to chronic back pain. Believes she is sob due to deconditioning. Diet low in sodium. She has gained 4 lbs since 06/2021. Denies chest pain, weight fluctuations or calf pain. Treatment options discussed, they would like to proceed with lab work and prn diuretic.  ? ?She has been waking up in the morning with frontal headaches. Resolves with tylenol 650 mg. Reports some allergy symptoms of nasal congestion and eye itching.  ? ?Continues to take thyroid medication daily on empty stomach.  ? ?[facility] sometimes elevated, SBP> 140. Not on medication at this time.  ? ?No recent falls or injuries.  ? ? ?Review of Systems:  ?Review of Systems  ?Constitutional:  Negative for chills, fever, malaise/fatigue and weight  loss.  ?HENT:  Positive for congestion. Negative for sore throat.   ?Eyes:  Negative for blurred vision and double vision.  ?Respiratory:  Positive for shortness of breath. Negative for cough, sputum production and wheezing.   ?Cardiovascular:  Positive for leg swelling. Negative for chest pain and orthopnea.  ?Gastrointestinal:  Negative for abdominal pain, blood in stool, constipation, diarrhea, heartburn, nausea and vomiting.  ?Genitourinary:  Negative for dysuria, frequency and hematuria.  ?Musculoskeletal:  Positive for back pain. Negative for falls and joint pain.  ?Skin: Negative.   ?Neurological:  Positive for headaches. Negative for dizziness and weakness.  ?Psychiatric/Behavioral:  Negative for depression. The patient is not nervous/anxious and does not have insomnia.   ? ?Past Medical History:  ?Diagnosis Date  ? Anxiety   ? Chest pain 02/08/15  ? ETT normal  ? Diverticulosis   ? Diverticulitis (1982)  ? Fracture of rib of right side 08/16/2013  ? GERD (gastroesophageal reflux disease)   ? Hyperlipidemia, mixed   ? Hypothyroidism   ? IBS (irritable bowel syndrome)   ? Morton's neuroma   ? Right lumbar radiculopathy   ? Intermittent x 2 episodes in summer 2014  ? Shingles 1967 and 2010  ? Ulcer   ? Urine incontinence   ? Vertigo   ? ?Past Surgical History:  ?Procedure Laterality Date  ? CARDIOVASCULAR STRESS TEST  02/08/15  ? ETT normal  ? CATARACT EXTRACTION  2012  ? Dr. Herbert Deaner  ? COLONOSCOPY  2005  ? Dr. Arsenio Loader at [facility] (normal  per pt report)  ? DEXA  11/03/13  ? Heel T score -0.8.  ? [place]  ? right foot  ? TONSILLECTOMY    ? [place]  ? Dr. Reece Leader [facility]  ? TUBAL LIGATION  1974  ? WISDOM TOOTH EXTRACTION    ? ?Social History: ?  reports that she has quit smoking. Her smoking use included cigarettes. She started smoking about 38 years ago. She has a 8.00 pack-year smoking history. She has never used smokeless tobacco. She reports that she does  not drink alcohol and does not use drugs. ? ?Family History  ?Problem Relation Age of Onset  ? Tuberculosis Mother   ? Pancreatic cancer Father   ? Asthma Daughter   ? Heart attack Paternal Grandfather   ? ? ?Medications: ?Patient's Medications  ?New Prescriptions  ? No medications on file  ?Previous Medications  ? ACETAMINOPHEN (TYLENOL) 325 MG TABLET    Take 2 tablets (650 mg total) by mouth every 6 (six) hours as needed.  ? ASPIRIN 81 MG TABLET    Take 81 mg by mouth every morning.   ? ATORVASTATIN (LIPITOR) 10 MG TABLET    TAKE ONE TABLET BY MOUTH ONCE DAILY  ? DULOXETINE (CYMBALTA) 30 MG CAPSULE    Take 1 capsule (30 mg total) by mouth daily.  ? FIBER POWD    Take by mouth.  ? GABAPENTIN (NEURONTIN) 100 MG CAPSULE    Take 300 mg by mouth 3 (three) times daily.  ? LEVOTHYROXINE (SYNTHROID) 100 MCG TABLET    Take 1 tablet (100 mcg total) by mouth daily.  ? LORAZEPAM (ATIVAN) 1 MG TABLET    TAKE ONE TABLET BY MOUTH EVERY 8 HOURS AS NEEDED FOR ANXIETY  ? MAGNESIUM 250 MG TABS    Take 2 tablets by mouth daily.   ? METHOCARBAMOL (ROBAXIN) 500 MG TABLET    Take 1 tablet (500 mg total) by mouth 2 (two) times daily.  ? NYSTATIN CREAM (MYCOSTATIN)    Apply 1 application topically 2 (two) times daily. To left breast fold  ? OMEPRAZOLE (PRILOSEC) 40 MG CAPSULE    Take 1 capsule (40 mg total) by mouth daily.  ? TRAMADOL (ULTRAM) 50 MG TABLET    TAKE 1/2 TABLET BY MOUTH EVERY DAY  ?Modified Medications  ? No medications on file  ?Discontinued Medications  ? No medications on file  ? ? ?Physical Exam: ? ?There were no vitals filed for this visit. ?There is no height or weight on file to calculate BMI. ?Wt Readings from Last 3 Encounters:  ?09/14/21 173 lb (78.5 kg)  ?06/29/21 167 lb 8 oz (76 kg)  ?04/25/21 165 lb (74.8 kg)  ? ? ?Physical Exam ?Vitals reviewed.  ?Constitutional:   ?   General: She is not in acute distress. ?HENT:  ?   Head: Normocephalic.  ?Eyes:  ?   General:     ?   Right eye: No discharge.     ?   Left eye:  No discharge.  ?Neck:  ?   Vascular: No carotid bruit.  ?Cardiovascular:  ?   Rate and Rhythm: Normal rate and regular rhythm.  ?   Pulses: Normal pulses.  ?   Heart sounds: Normal heart sounds.  ?Pulmonary:  ?   Effort: Pulmonary effort is normal. No respiratory distress.  ?   Breath sounds: Normal breath sounds. No wheezing or rales.  ?Abdominal:  ?  General: Bowel sounds are normal. There is no distension.  ?   Palpations: Abdomen is soft.  ?   Tenderness: There is no abdominal tenderness.  ?Musculoskeletal:  ?   Cervical back: Neck supple.  ?   Right lower leg: Edema present.  ?   Left lower leg: Edema present.  ?   Comments: Non pitting, extremities cool to touch  ?Lymphadenopathy:  ?   Cervical: No cervical adenopathy.  ?Skin: ?   General: Skin is warm and dry.  ?   Capillary Refill: Capillary refill takes less than 2 seconds.  ?Neurological:  ?   General: No focal deficit present.  ?   Mental Status: She is alert and oriented to person, place, and time.  ?Psychiatric:     ?   Mood and Affect: Mood normal.     ?   Behavior: Behavior normal.  ? ? ?Labs reviewed: ?Basic Metabolic Panel: ?Recent Labs  ?  06/28/21 ?0263 08/09/21 ?0831  ?NA 138  --   ?K 4.9  --   ?CL 104  --   ?CO2 24  --   ?GLUCOSE 87  --   ?BUN 20  --   ?CREATININE 1.02*  --   ?CALCIUM 9.0  --   ?TSH 4.95* 1.24  ? ?Liver Function Tests: ?Recent Labs  ?  06/28/21 ?0841  ?AST 17  ?ALT 15  ?BILITOT 0.5  ?PROT 6.8  ? ?No results for input(s): LIPASE, AMYLASE in the last 8760 hours. ?No results for input(s): AMMONIA in the last 8760 hours. ?CBC: ?Recent Labs  ?  06/28/21 ?0841  ?WBC 5.7  ?NEUTROABS 2,491  ?HGB 14.0  ?HCT 43.8  ?MCV 90.9  ?PLT 254  ? ?Lipid Panel: ?Recent Labs  ?  06/28/21 ?0841  ?CHOL 152  ?HDL 59  ?[place] 75  ?TRIG 98  ?CHOLHDL 2.6  ? ?TSH: ?Recent Labs  ?  06/28/21 ?7858 08/09/21 ?0831  ?TSH 4.95* 1.24  ? ?A1C: ?Lab Results  ?Component Value Date  ? HGBA1C 5.6 08/15/2020  ? ? ? ?Assessment/Plan ?1. Lower leg edema ?- non pitting  edema ?- no weight fluctuations or rales  ?- furosemide (LASIX) 20 MG tablet; Take 1 tablet (20 mg total) by mouth daily as needed for edema.  Dispense: 30 tablet; Refill: 3 ?- advised to contact PCP i

## 2021-10-06 LAB — BASIC METABOLIC PANEL
BUN: 18 mg/dL (ref 7–25)
CO2: 24 mmol/L (ref 20–32)
Calcium: 8.9 mg/dL (ref 8.6–10.4)
Chloride: 106 mmol/L (ref 98–110)
Creat: 0.86 mg/dL (ref 0.60–0.95)
Glucose, Bld: 83 mg/dL (ref 65–99)
Potassium: 4.7 mmol/L (ref 3.5–5.3)
Sodium: 137 mmol/L (ref 135–146)

## 2021-10-06 LAB — CBC WITH DIFFERENTIAL/PLATELET
Absolute Monocytes: 588 cells/uL (ref 200–950)
Basophils Absolute: 42 cells/uL (ref 0–200)
Basophils Relative: 0.8 %
Eosinophils Absolute: 180 cells/uL (ref 15–500)
Eosinophils Relative: 3.4 %
HCT: 41.4 % (ref 35.0–45.0)
Hemoglobin: 13.3 g/dL (ref 11.7–15.5)
Lymphs Abs: 2157 cells/uL (ref 850–3900)
MCH: 28.4 pg (ref 27.0–33.0)
MCHC: 32.1 g/dL (ref 32.0–36.0)
MCV: 88.5 fL (ref 80.0–100.0)
MPV: 10.4 fL (ref 7.5–12.5)
Monocytes Relative: 11.1 %
Neutro Abs: 2332 cells/uL (ref 1500–7800)
Neutrophils Relative %: 44 %
Platelets: 222 10*3/uL (ref 140–400)
RBC: 4.68 10*6/uL (ref 3.80–5.10)
RDW: 13 % (ref 11.0–15.0)
Total Lymphocyte: 40.7 %
WBC: 5.3 10*3/uL (ref 3.8–10.8)

## 2021-10-06 LAB — BRAIN NATRIURETIC PEPTIDE: Brain Natriuretic Peptide: 43 pg/mL (ref <100)

## 2021-10-18 ENCOUNTER — Ambulatory Visit (HOSPITAL_COMMUNITY)
Admission: RE | Admit: 2021-10-18 | Discharge: 2021-10-18 | Disposition: A | Discharge status: Home / Self Care | Payer: Medicare HMO | Source: Ambulatory Visit | Attending: Orthopedic Surgery | Admitting: Orthopedic Surgery

## 2021-10-18 DIAGNOSIS — Z1231 Encounter for screening mammogram for malignant neoplasm of breast: Secondary | ICD-10-CM | POA: Insufficient documentation

## 2021-10-22 ENCOUNTER — Other Ambulatory Visit: Payer: Self-pay | Admitting: Orthopedic Surgery

## 2021-10-23 NOTE — Telephone Encounter (Signed)
Patient has request refill on medication "Lorazepam". Patient last refill 05/01/2021. Patient has Non Opioid Contract on file dated 12/12/2020. Patient has upcoming appointment 12/28/2021. "UPDATE CONTRACT" added to patient appointment note. Medication pend and sent to PCP Yvonna Alanis, NP for approval. Please Advise.  ?

## 2021-11-10 ENCOUNTER — Other Ambulatory Visit: Payer: Self-pay | Admitting: Orthopedic Surgery

## 2021-11-13 ENCOUNTER — Ambulatory Visit: Payer: Medicare HMO | Admitting: Neurology

## 2021-11-30 ENCOUNTER — Other Ambulatory Visit: Payer: Self-pay | Admitting: Neurology

## 2021-11-30 NOTE — Telephone Encounter (Signed)
Rx refilled.

## 2021-12-05 ENCOUNTER — Other Ambulatory Visit: Payer: Self-pay | Admitting: Neurology

## 2021-12-05 ENCOUNTER — Telehealth: Payer: Self-pay | Admitting: Neurology

## 2021-12-05 MED ORDER — GABAPENTIN 300 MG PO CAPS: 300.0000 mg | ORAL_CAPSULE | Freq: Three times a day (TID) | ORAL | 11 refills | Status: DC

## 2021-12-05 NOTE — Telephone Encounter (Signed)
Pt called [facility] stating she has questions regarding her dosage change on her  gabapentin (NEURONTIN) 100 MG capsule  Please advise  Imagene Gurney)

## 2021-12-05 NOTE — Telephone Encounter (Signed)
Called the patient she has been taking  Gabapentin 100 mg, Taking 3 cap three times a day for a total of 900 mg/24 hr.  Per previous documentation the it looked like gabapentin was not covered like this through her insurance.  Advised the patient to help simplify since this is the dose she is taking we would send over 300 mg TID to it would condense to 1 cap TID instead of 3. [facility] with a note to DC the previous script on file. Pt verbalized understanding. Pt had no questions at this time but was encouraged to call back if questions arise.

## 2021-12-08 ENCOUNTER — Telehealth: Payer: Self-pay | Admitting: *Deleted

## 2021-12-08 NOTE — Telephone Encounter (Signed)
Called [employer] [phone] to obtain Prior Authorization for Prolia.   Spoke with [employer] Prior Auth APPROVED for 12/08/21-12/09/22 Authorization #: [id number]  Member ID: [id number]

## 2021-12-13 ENCOUNTER — Other Ambulatory Visit: Payer: Self-pay | Admitting: Orthopedic Surgery

## 2021-12-13 DIAGNOSIS — G8929 Other chronic pain: Secondary | ICD-10-CM

## 2021-12-13 NOTE — Telephone Encounter (Signed)
Rx last refill (05/23/21), 26/5 refill. Treatment agreement on file but needs to be updated at upcoming appointment 12/28/2021.  Please advise.

## 2021-12-27 ENCOUNTER — Encounter: Payer: Self-pay | Admitting: Orthopedic Surgery

## 2021-12-28 ENCOUNTER — Ambulatory Visit: Payer: Medicare HMO

## 2021-12-28 ENCOUNTER — Encounter: Payer: Self-pay | Admitting: Orthopedic Surgery

## 2021-12-28 ENCOUNTER — Ambulatory Visit (INDEPENDENT_AMBULATORY_CARE_PROVIDER_SITE_OTHER): Payer: Medicare HMO | Admitting: Orthopedic Surgery

## 2021-12-28 VITALS — BP 136/64 | HR 79 | Temp 97.3°F | Resp 18 | Ht 64.0 in | Wt 173.6 lb

## 2021-12-28 DIAGNOSIS — I10 Essential (primary) hypertension: Secondary | ICD-10-CM

## 2021-12-28 DIAGNOSIS — R42 Dizziness and giddiness: Secondary | ICD-10-CM

## 2021-12-28 DIAGNOSIS — F419 Anxiety disorder, unspecified: Secondary | ICD-10-CM

## 2021-12-28 DIAGNOSIS — R600 Localized edema: Secondary | ICD-10-CM | POA: Diagnosis not present

## 2021-12-28 DIAGNOSIS — M810 Age-related osteoporosis without current pathological fracture: Secondary | ICD-10-CM | POA: Diagnosis not present

## 2021-12-28 DIAGNOSIS — E782 Mixed hyperlipidemia: Secondary | ICD-10-CM

## 2021-12-28 DIAGNOSIS — M5441 Lumbago with sciatica, right side: Secondary | ICD-10-CM

## 2021-12-28 DIAGNOSIS — G8929 Other chronic pain: Secondary | ICD-10-CM

## 2021-12-28 DIAGNOSIS — M5442 Lumbago with sciatica, left side: Secondary | ICD-10-CM

## 2021-12-28 DIAGNOSIS — E039 Hypothyroidism, unspecified: Secondary | ICD-10-CM

## 2021-12-28 DIAGNOSIS — K219 Gastro-esophageal reflux disease without esophagitis: Secondary | ICD-10-CM

## 2021-12-28 LAB — CBC WITH DIFFERENTIAL/PLATELET
Absolute Monocytes: 630 cells/uL (ref 200–950)
Basophils Absolute: 40 cells/uL (ref 0–200)
Basophils Relative: 0.8 %
Eosinophils Absolute: 230 cells/uL (ref 15–500)
Eosinophils Relative: 4.6 %
HCT: 40.1 % (ref 35.0–45.0)
Hemoglobin: 13.1 g/dL (ref 11.7–15.5)
Lymphs Abs: 1995 cells/uL (ref 850–3900)
MCH: 28.8 pg (ref 27.0–33.0)
MCHC: 32.7 g/dL (ref 32.0–36.0)
MCV: 88.1 fL (ref 80.0–100.0)
MPV: 10.8 fL (ref 7.5–12.5)
Monocytes Relative: 12.6 %
Neutro Abs: 2105 cells/uL (ref 1500–7800)
Neutrophils Relative %: 42.1 %
Platelets: 238 10*3/uL (ref 140–400)
RBC: 4.55 10*6/uL (ref 3.80–5.10)
RDW: 13.2 % (ref 11.0–15.0)
Total Lymphocyte: 39.9 %
WBC: 5 10*3/uL (ref 3.8–10.8)

## 2021-12-28 MED ORDER — DENOSUMAB 60 MG/ML ~~LOC~~ SOSY
60.0000 mg | PREFILLED_SYRINGE | Freq: Once | SUBCUTANEOUS | Status: AC
  Administered 2021-12-28: 60 mg via SUBCUTANEOUS

## 2021-12-28 MED ORDER — LORAZEPAM 1 MG PO TABS: 1.0000 mg | ORAL_TABLET | Freq: Three times a day (TID) | ORAL | 0 refills | Status: DC | PRN

## 2021-12-28 NOTE — Progress Notes (Signed)
Careteam: Patient Care Team: Yvonna Alanis, NP as PCP - General (Adult Health Nurse Practitioner) Monna Fam, MD as Consulting Physician (Ophthalmology) Gerarda Fraction, MD as Referring Physician (Ophthalmology) Carole Civil, MD as Consulting Physician (Orthopedic Surgery)  Seen by: Windell Moulding, AGNP-C  PLACE OF SERVICE:  [place] Directive information Does Patient Have a Medical Advance Directive?: Yes, Type of Advance Directive: [place];Living will, Does patient want to make changes to medical advance directive?: No - Patient declined  No Known Allergies  Chief Complaint  Patient presents with   Medical Management of Chronic Issues    -6 Month follow up & PROLIA.Marland Kitchen UPDATE CONTRACT!!  prolia inj / No [place] PA APPROVED     HPI: Patient is a 88 y.o. female seen today for medical management of chronic conditions.   Dizziness- she has had a few episodes of increased dizziness and feeling flushed, se has not taken her blood sugar or blood pressure when this occurrs, discussed falls safety prevention, she denies hematuria or black tarry stools  Lower leg edema- was given furosemide prn last encounter, she never took it due to incontinence, she is elevating her legs daily and limiting sodium in diet  HTN- controlled without medication  HLD- LDL 75 06/28/2021, remains on Lipitor- started taking in AM  Hypothyroidism- taking levothyroxine on empty stomach  GERD- continues to take omeprazole daily  Chronic back pain- stable with gabapentin and tramadol, controlled substance contract signed  Anxiety- no recent panic attacks, remains on ativan prn  No recent falls or injuries, ambulates with cane.   Does not want any more covid booster vaccinations at this time.   Review of Systems:  Review of Systems  Constitutional:  Negative for chills, fever, malaise/fatigue and weight loss.  HENT:  Negative for congestion and sore throat.    Eyes:  Negative for blurred vision and double vision.  Respiratory:  Negative for cough, shortness of breath and wheezing.   Cardiovascular:  Positive for leg swelling. Negative for chest pain.  Gastrointestinal:  Positive for heartburn. Negative for abdominal pain, blood in stool, constipation, diarrhea, nausea and vomiting.  Genitourinary:  Negative for dysuria and frequency.  Musculoskeletal:  Positive for back pain, joint pain and myalgias. Negative for falls.  Skin:  Negative for rash.  Neurological:  Positive for dizziness and weakness. Negative for headaches.  Psychiatric/Behavioral:  Negative for depression and memory loss. The patient is nervous/anxious. The patient does not have insomnia.     Past Medical History:  Diagnosis Date   Anxiety    Chest pain 02/08/15   ETT normal   Diverticulosis    Diverticulitis (1982)   Fracture of rib of right side 08/16/2013   GERD (gastroesophageal reflux disease)    Hyperlipidemia, mixed    Hypothyroidism    IBS (irritable bowel syndrome)    Morton's neuroma    Right lumbar radiculopathy    Intermittent x 2 episodes in summer 2014   Shingles 1967 and 2010   Ulcer    Urine incontinence    Vertigo    Past Surgical History:  Procedure Laterality Date   CARDIOVASCULAR STRESS TEST  02/08/15   ETT normal   CATARACT EXTRACTION  2012   Dr. Herbert Deaner   COLONOSCOPY  2005   [facility]. Arsenio Loader at [facility] (normal per pt report)   DEXA  11/03/13   Heel T score -0.8.   FOOT NEUROMA SURGERY  1994   right foot   TONSILLECTOMY  TONSILLECTOMY AND ADENOIDECTOMY  1948   Dr. [place]   WISDOM TOOTH EXTRACTION     Social History:   reports that she has quit smoking. Her smoking use included cigarettes. She started smoking about 38 years ago. She has a 8.00 pack-year smoking history. She has never used smokeless tobacco. She reports that she does not drink alcohol and does not use drugs.  Family History  Problem  Relation Age of Onset   Tuberculosis Mother    Pancreatic cancer Father    Asthma Daughter    Heart attack Paternal Grandfather     Medications: Patient's Medications  New Prescriptions   No medications on file  Previous Medications   ACETAMINOPHEN (TYLENOL) 325 MG TABLET    Take 2 tablets (650 mg total) by mouth every 6 (six) hours as needed.   ASPIRIN 81 MG TABLET    Take 81 mg by mouth every morning.    ATORVASTATIN (LIPITOR) 10 MG TABLET    TAKE ONE TABLET BY MOUTH ONCE DAILY   FIBER POWD    Take by mouth.   FUROSEMIDE (LASIX) 20 MG TABLET    Take 1 tablet (20 mg total) by mouth daily as needed for edema.   GABAPENTIN (NEURONTIN) 300 MG CAPSULE    Take 1 capsule (300 mg total) by mouth 3 (three) times daily.   LEVOTHYROXINE (SYNTHROID) 100 MCG TABLET    Take 1 tablet (100 mcg total) by mouth daily.   LORAZEPAM (ATIVAN) 1 MG TABLET    TAKE ONE TABLET BY MOUTH EVERY 8 HOURS AS NEEDED FOR ANXIETY   MAGNESIUM 250 MG TABS    Take 2 tablets by mouth daily.    NYSTATIN CREAM (MYCOSTATIN)    Apply 1 application. topically as needed for dry skin.   OMEPRAZOLE (PRILOSEC) 40 MG CAPSULE    Take 1 capsule (40 mg total) by mouth daily.   TRAMADOL (ULTRAM) 50 MG TABLET    TAKE 1/2 TABLET BY MOUTH EVERY DAY  Modified Medications   No medications on file  Discontinued Medications   No medications on file    Physical Exam:  There were no vitals filed for this visit. There is no height or weight on file to calculate BMI. Wt Readings from Last 3 Encounters:  10/05/21 171 lb (77.6 kg)  09/14/21 173 lb (78.5 kg)  06/29/21 167 lb 8 oz (76 kg)    Physical Exam Vitals reviewed.  Constitutional:      General: She is not in acute distress.    Appearance: She is normal weight.  HENT:     Head: Normocephalic.  Eyes:     General:        Right eye: No discharge.        Left eye: No discharge.  Cardiovascular:     Rate and Rhythm: Normal rate and regular rhythm.     Pulses: Normal pulses.      Heart sounds: Normal heart sounds.  Pulmonary:     Effort: Pulmonary effort is normal. No respiratory distress.     Breath sounds: Normal breath sounds. No wheezing.  Abdominal:     General: Bowel sounds are normal. There is no distension.     Palpations: Abdomen is soft.     Tenderness: There is no abdominal tenderness.  Musculoskeletal:     Cervical back: Neck supple.     Right lower leg: Edema present.     Left lower leg:  Edema present.     Comments: Non pitting  Skin:    General: Skin is warm and dry.     Capillary Refill: Capillary refill takes less than 2 seconds.  Neurological:     General: No focal deficit present.     Mental Status: She is alert and oriented to person, place, and time.     Motor: Weakness present.     Gait: Gait abnormal.     Comments: cane  Psychiatric:        Mood and Affect: Mood normal.        Behavior: Behavior normal.     Labs reviewed: Basic Metabolic Panel: Recent Labs    06/28/21 0841 08/09/21 0831 10/05/21 1511  NA 138  --  137  K 4.9  --  4.7  CL 104  --  106  CO2 24  --  24  GLUCOSE 87  --  83  BUN 20  --  18  CREATININE 1.02*  --  0.86  CALCIUM 9.0  --  8.9  TSH 4.95* 1.24  --    Liver Function Tests: Recent Labs    06/28/21 0841  AST 17  ALT 15  BILITOT 0.5  PROT 6.8   No results for input(s): "LIPASE", "AMYLASE" in the last 8760 hours. No results for input(s): "AMMONIA" in the last 8760 hours. CBC: Recent Labs    06/28/21 0841 10/05/21 1511  WBC 5.7 5.3  NEUTROABS 2,491 2,332  HGB 14.0 13.3  HCT 43.8 41.4  MCV 90.9 88.5  PLT 254 222   Lipid Panel: Recent Labs    06/28/21 0841  CHOL 152  HDL 59  LDLCALC 75  TRIG 98  CHOLHDL 2.6   TSH: Recent Labs    06/28/21 0841 08/09/21 0831  TSH 4.95* 1.24   A1C: Lab Results  Component Value Date   HGBA1C 5.6 08/15/2020     Assessment/Plan 1. Dizziness - few episodes in past month - exam unremarkable - denies sign of blood loss - will check  cbc/diff - discussed checking blood pressure and blood sugar when episodes occur - discussed falls safety when episodes occur - CBC with Differential/Platelet  2. Senile osteoporosis - cont Prolia injections - DEXA 2020, t score -2.8 (spine)  3. Lower leg edema - non pitting - did not want to try furosemide prn due to urinary frequency - cont leg elevation - cont low sodium diet  4. Primary hypertension - controlled without medication  5. Mixed hyperlipidemia - cont statin  6. Acquired hypothyroidism - cont levothyroxine  7. Gastroesophageal reflux disease without esophagitis - cont omeprazole  8. Chronic midline low back pain with bilateral sciatica - opioid contract signed today - cont tramadol and gabapentin  9. Anxiety - no recent panic attacks - see above - cont ativan  Total time: 32 minutes. Greater than 50% of total time spent doing patient education regarding health maintenance, symptom/medication management regarding chronic conditions.     Next appt: Visit date not found  [place], [facility] [phone]

## 2021-12-28 NOTE — Patient Instructions (Addendum)
Please start taking Lipitor at night  Consider going to [facility] and getting your skin checked

## 2021-12-31 ENCOUNTER — Other Ambulatory Visit: Payer: Self-pay | Admitting: Orthopedic Surgery

## 2021-12-31 DIAGNOSIS — R600 Localized edema: Secondary | ICD-10-CM

## 2022-01-01 ENCOUNTER — Ambulatory Visit: Payer: Medicare HMO | Admitting: Neurology

## 2022-01-22 ENCOUNTER — Other Ambulatory Visit: Payer: Self-pay | Admitting: Orthopedic Surgery

## 2022-01-22 DIAGNOSIS — G8929 Other chronic pain: Secondary | ICD-10-CM

## 2022-01-23 NOTE — Telephone Encounter (Signed)
RX last refilled on 12/13/2021  Treatment agreement on file from 7/6/20223

## 2022-02-09 ENCOUNTER — Other Ambulatory Visit: Payer: Self-pay | Admitting: Orthopedic Surgery

## 2022-02-09 DIAGNOSIS — K219 Gastro-esophageal reflux disease without esophagitis: Secondary | ICD-10-CM

## 2022-02-15 ENCOUNTER — Ambulatory Visit: Payer: Medicare HMO | Admitting: Orthopedic Surgery

## 2022-02-20 ENCOUNTER — Other Ambulatory Visit: Payer: Self-pay | Admitting: Orthopedic Surgery

## 2022-02-20 DIAGNOSIS — G8929 Other chronic pain: Secondary | ICD-10-CM

## 2022-02-21 NOTE — Telephone Encounter (Signed)
Patient has request refill on medication Tramadol '50mg'$ . Patient last refill dated 01/23/2022. Patient has Non Opioid Contract on file dated 01/02/2022. Patient medication pend and sent to PCP Yvonna Alanis, NP for approval.

## 2022-03-05 ENCOUNTER — Encounter: Payer: Self-pay | Admitting: Orthopedic Surgery

## 2022-03-05 ENCOUNTER — Ambulatory Visit (INDEPENDENT_AMBULATORY_CARE_PROVIDER_SITE_OTHER): Payer: Medicare HMO

## 2022-03-05 ENCOUNTER — Ambulatory Visit: Payer: Medicare HMO | Admitting: Orthopedic Surgery

## 2022-03-05 VITALS — BP 156/79 | HR 99 | Ht 64.0 in | Wt 171.0 lb

## 2022-03-05 DIAGNOSIS — M1711 Unilateral primary osteoarthritis, right knee: Secondary | ICD-10-CM | POA: Diagnosis not present

## 2022-03-05 DIAGNOSIS — M7651 Patellar tendinitis, right knee: Secondary | ICD-10-CM | POA: Diagnosis not present

## 2022-03-05 DIAGNOSIS — M7652 Patellar tendinitis, left knee: Secondary | ICD-10-CM | POA: Diagnosis not present

## 2022-03-05 DIAGNOSIS — G8929 Other chronic pain: Secondary | ICD-10-CM

## 2022-03-05 MED ORDER — PREDNISONE 10 MG PO TABS: 10.0000 mg | ORAL_TABLET | Freq: Three times a day (TID) | ORAL | 0 refills | Status: DC

## 2022-03-05 NOTE — Progress Notes (Signed)
This patient is still in the 3-year window as an established patient with a new problem  Chief Complaint  Patient presents with   Knee Pain    Bilateral, R > L     Patient complains of bilateral knee pain right is worse although the right is not bothering her today.  She has had a history of multiple falls is concerned she may have an underlying fracture.  All of her pain is in the front of the knee  On exam she has full range of motion of the right knee but crepitance especially in the patellofemoral joint which exacerbated by patellofemoral compression her collateral ligaments and ACL and PCL are stable she has no effusion in the knee her quadricep strength is normal without atrophy she is neurovascularly intact she ambulates without assistive device  Her x-ray shows osteoarthritis of the right knee especially the patellofemoral joint with mild disease of any in the tibiofemoral joint  Recommend ice anti-inflammatories topical medications follow-up with [place] as needed she has a component of patellar tendinitis with this.

## 2022-03-05 NOTE — Patient Instructions (Addendum)
Follow these instructions at home: Medicines Take over-the-counter and prescription medicines only as told by your health care provider. These are the muscle and arthrits creams I recommend:  PLEASE READ THE PACKAGE INSTRUCTIONS BEFORE USING   Ben Gay arthritis cream  Icy hot vanishing gel  Aspercreme odor free  Myoflex Oderless pain reliever  Capzasin  Sportscreme  Max freeze Activity Rest your joint if told by your health care provider. Rest is important when your disease is active and your joint feels painful, swollen, or stiff. Avoid activities that make the pain worse. It is important to balance activity with rest. Exercise your joint regularly with range-of-motion exercises as told by your health care provider. Try doing low-impact exercise, such as: Swimming. Water aerobics. Biking. Walking. Joint Care   If your joint is swollen, keep it elevated if told by your health care provider. If your joint feels stiff in the morning, try taking a warm shower. If directed, apply heat to the joint. If you have diabetes, do not apply heat without permission from your health care provider. Put a towel between the joint and the hot pack or heating pad. Leave the heat on the area for 20-30 minutes. If directed, apply ice to the joint: Put ice in a plastic bag. Place a towel between your skin and the bag. Leave the ice on for 20 minutes, 2-3 times per day. Keep all follow-up visits as told by your health care provider. This is important. Contact a health care provider if: The pain gets worse. You have a fever. Get help right away if: You develop severe joint pain, swelling, or redness. Many joints become painful and swollen. You develop severe back pain. You develop severe weakness in your leg. You cannot control your bladder or bowels. This information is not intended to replace advice given to you by your health care provider. Make sure you discuss any questions you have  with your health care provider. Document Released: 07/19/2004 Document Revised: 11/17/2015 Document Reviewed: 09/06/2014 Elsevier Interactive Patient Education  2019 [employer].

## 2022-03-22 ENCOUNTER — Other Ambulatory Visit: Payer: Self-pay | Admitting: Orthopedic Surgery

## 2022-03-22 DIAGNOSIS — G8929 Other chronic pain: Secondary | ICD-10-CM

## 2022-03-22 NOTE — Telephone Encounter (Signed)
Medication last filled 02/21/22. Non opiod contract up to date.  Medication pended and sent to Windell Moulding, NP for approval/refusal

## 2022-04-22 ENCOUNTER — Other Ambulatory Visit: Payer: Self-pay | Admitting: Orthopedic Surgery

## 2022-04-22 DIAGNOSIS — F419 Anxiety disorder, unspecified: Secondary | ICD-10-CM

## 2022-04-23 NOTE — Telephone Encounter (Signed)
Pharmacy Requested refill.  Epic LR:01/28/2022 Contract Date:added note to upcoming appointment.   Pended Rx and sent to Amy for approval.

## 2022-04-26 ENCOUNTER — Other Ambulatory Visit: Payer: Self-pay | Admitting: Orthopedic Surgery

## 2022-04-26 DIAGNOSIS — E039 Hypothyroidism, unspecified: Secondary | ICD-10-CM

## 2022-04-26 DIAGNOSIS — G8929 Other chronic pain: Secondary | ICD-10-CM

## 2022-04-26 NOTE — Telephone Encounter (Signed)
Patient has request refill on medication Tramadol '50mg'$ . Patient last refill dated 03/22/2022. Patient has Non Opioid Contract dated 01/02/2022. Patient medication pend and sent to PCP Yvonna Alanis, NP for approval.

## 2022-05-01 ENCOUNTER — Other Ambulatory Visit: Payer: Self-pay | Admitting: Orthopedic Surgery

## 2022-05-01 DIAGNOSIS — F419 Anxiety disorder, unspecified: Secondary | ICD-10-CM

## 2022-05-01 NOTE — Telephone Encounter (Signed)
Patient is requesting a refill of the following medications: Requested Prescriptions   Pending Prescriptions Disp Refills   LORazepam (ATIVAN) 1 MG tablet [Pharmacy Med Name: lorazepam 1 mg tablet] 90 tablet 5    Sig: TAKE ONE TABLET BY MOUTH EVERY 8 HOURS AS NEEDED FOR ANXIETY    Date of last refill: 01/28/2022  Refill amount: 90  Treatment agreement date: N/A, notation made on pending appointment for 07/05/2022 to sign.

## 2022-05-25 ENCOUNTER — Ambulatory Visit (INDEPENDENT_AMBULATORY_CARE_PROVIDER_SITE_OTHER): Payer: Medicare HMO | Admitting: Family

## 2022-05-25 ENCOUNTER — Encounter: Payer: Self-pay | Admitting: Family

## 2022-05-25 ENCOUNTER — Ambulatory Visit
Admission: RE | Admit: 2022-05-25 | Discharge: 2022-05-25 | Disposition: A | Discharge status: Home / Self Care | Payer: Medicare HMO | Source: Ambulatory Visit | Attending: Family | Admitting: Family

## 2022-05-25 VITALS — BP 140/70 | HR 81 | Temp 97.9°F | Resp 16 | Ht 64.0 in | Wt 177.6 lb

## 2022-05-25 DIAGNOSIS — R0602 Shortness of breath: Secondary | ICD-10-CM

## 2022-05-25 DIAGNOSIS — R0981 Nasal congestion: Secondary | ICD-10-CM

## 2022-05-25 DIAGNOSIS — R42 Dizziness and giddiness: Secondary | ICD-10-CM | POA: Diagnosis not present

## 2022-05-25 DIAGNOSIS — I714 Abdominal aortic aneurysm, without rupture, unspecified: Secondary | ICD-10-CM | POA: Diagnosis not present

## 2022-05-25 DIAGNOSIS — R110 Nausea: Secondary | ICD-10-CM

## 2022-05-25 MED ORDER — LORATADINE 10 MG PO TABS: 10.0000 mg | ORAL_TABLET | Freq: Every day | ORAL | 11 refills | Status: DC

## 2022-05-25 MED ORDER — SALINE NASAL SPRAY 0.65 % NA SOLN: 1.0000 | NASAL | 12 refills | Status: AC | PRN

## 2022-05-25 MED ORDER — FLUTICASONE PROPIONATE 50 MCG/ACT NA SUSP: 2.0000 | Freq: Every day | NASAL | 6 refills | Status: DC

## 2022-05-25 NOTE — Progress Notes (Signed)
Provider: Dinah Ngetich FNP-C  Yvonna Alanis, NP  Patient Care Team: Yvonna Alanis, NP as PCP - General (Adult Health Nurse Practitioner) Monna Fam, MD as Consulting Physician (Ophthalmology) Gerarda Fraction, MD as Referring Physician (Ophthalmology) Carole Civil, MD as Consulting Physician (Orthopedic Surgery)  Extended Emergency Contact Information Primary Emergency Contact: Terry,Tish Address: [address]          [place], [place] [address] Johnnette Litter of [place] Phone: [phone] Work Phone: [phone] Mobile Phone: [phone] Relation: Daughter  Code Status:  Full Code  Goals of care: Advanced Directive information    05/25/2022    2:51 PM  Advanced Directives  Does Patient Have a Medical Advance Directive? Yes  Type of Advance Directive Living will;Healthcare Power of [place];Out of facility DNR (pink MOST or yellow form)  Does patient want to make changes to medical advance directive? No - Patient declined  Copy of [place] in Chart? No - copy requested     Chief Complaint  Patient presents with   Acute Visit    Patient complains of possible vertigo due to dizziness.     HPI:  Pt is a 88 y.o. female seen today for an acute visit for evaluation of vertigo and nausea.states symptoms was worst this morning while lying in bed tried changing position in bed and felt like the room was spinning. Nose felt stuffed up this morning and had some nausea.Almost fell this morning while walking to the bathroom due to vertigo. Also had headache took some tylenol with relief.  denies any vision changes,fatigue,chest tightness,palpitation,chest pain or shortness of breath.    Also request referral to specialist for evaluation of Abdominal Aneurysm noted on previous CT scan.On chart review, Infrarenal abdominal aortic ectasia and Ectatic abdominal aorta at risk for aneurysm development noted on CT scan done 01/24/2019. Follow up ultrasound in 5  years was recommend.   Past Medical History:  Diagnosis Date   Anxiety    Chest pain 02/08/15   ETT normal   Diverticulosis    Diverticulitis (1982)   Fracture of rib of right side 08/16/2013   GERD (gastroesophageal reflux disease)    Hyperlipidemia, mixed    Hypothyroidism    IBS (irritable bowel syndrome)    Morton's neuroma    Right lumbar radiculopathy    Intermittent x 2 episodes in summer 2014   Shingles 1967 and 2010   Ulcer    Urine incontinence    Vertigo    Past Surgical History:  Procedure Laterality Date   CARDIOVASCULAR STRESS TEST  02/08/15   ETT normal   CATARACT EXTRACTION  2012   Dr. Herbert Deaner   COLONOSCOPY  2005   [facility] Arsenio Loader [facility] (normal per pt report)   DEXA  11/03/13   Heel T score -0.8.   [place]   right foot   TONSILLECTOMY     TONSILLECTOMY AND ADENOIDECTOMY  1948   Dr. Carnot-Moon EXTRACTION      No Known Allergies  Outpatient Encounter Medications as of 05/25/2022  Medication Sig   acetaminophen (TYLENOL) 325 MG tablet Take 2 tablets (650 mg total) by mouth every 6 (six) hours as needed.   aspirin 81 MG tablet Take 81 mg by mouth every morning.    atorvastatin (LIPITOR) 10 MG tablet TAKE ONE TABLET BY MOUTH ONCE DAILY   Fiber POWD Take by mouth.  furosemide (LASIX) 20 MG tablet TAKE 1 TABLET (20 MG TOTAL) BY MOUTH DAILY AS NEEDED FOR EDEMA.   gabapentin (NEURONTIN) 300 MG capsule Take 1 capsule (300 mg total) by mouth 3 (three) times daily.   levothyroxine (SYNTHROID) 100 MCG tablet TAKE 1 TABLET BY MOUTH EVERY DAY   LORazepam (ATIVAN) 1 MG tablet TAKE ONE TABLET BY MOUTH EVERY 8 HOURS AS NEEDED FOR ANXIETY   Magnesium 250 MG TABS Take 2 tablets by mouth daily.    nystatin cream (MYCOSTATIN) Apply 1 application. topically as needed for dry skin.   omeprazole (PRILOSEC) 40 MG capsule TAKE ONE CAPSULE BY MOUTH ONCE DAILY   predniSONE (DELTASONE) 10 MG tablet Take 1  tablet (10 mg total) by mouth 3 (three) times daily.   traMADol (ULTRAM) 50 MG tablet TAKE 1/2 TABLET BY MOUTH DAILY   Facility-Administered Encounter Medications as of 05/25/2022  Medication   Romosozumab-aqqg (EVENITY) 105 MG/1.17ML injection 210 mg    Review of Systems  Constitutional:  Negative for appetite change, chills, fatigue, fever and unexpected weight change.  HENT:  Positive for congestion. Negative for ear discharge, ear pain, nosebleeds, postnasal drip, rhinorrhea, sinus pressure, sinus pain, sneezing, sore throat, tinnitus and trouble swallowing.   Eyes:  Negative for pain, discharge, redness, itching and visual disturbance.  Respiratory:  Negative for cough, chest tightness, shortness of breath and wheezing.   Cardiovascular:  Negative for chest pain, palpitations and leg swelling.  Gastrointestinal:  Negative for abdominal distention, abdominal pain, blood in stool, constipation, diarrhea and vomiting.       Had nausea this morning but has resolved   Genitourinary:  Negative for difficulty urinating, dysuria, flank pain, frequency and urgency.  Musculoskeletal:  Positive for gait problem. Negative for arthralgias, back pain, joint swelling, myalgias, neck pain and neck stiffness.  Skin:  Negative for color change, pallor and rash.  Neurological:  Positive for headaches. Negative for dizziness, syncope, speech difficulty, weakness, light-headedness and numbness.       Vertigo   Hematological:  Does not bruise/bleed easily.  Psychiatric/Behavioral:  Negative for agitation, behavioral problems, confusion, hallucinations, self-injury, sleep disturbance and suicidal ideas. The patient is not nervous/anxious.     Immunization History  Administered Date(s) Administered   Fluad Quad(high Dose 65+) 04/13/2021   Influenza Split 02/24/2012   Influenza, High Dose Seasonal PF 02/23/2013, 04/29/2018, 04/24/2019   Influenza,inj,Quad PF,6+ Mos 02/23/2013   Influenza-Unspecified  02/24/2012, 03/08/2015, 03/26/2017, 05/14/2020   PFIZER(Purple Top)SARS-COV-2 Vaccination 07/15/2019, 08/14/2019, 04/14/2020   Pfizer Covid-19 Vaccine Bivalent Booster 64yr & up 05/10/2021   Pneumococcal Conjugate-13 03/25/1996, 03/20/2013   Pneumococcal Polysaccharide-23 04/16/1996, 03/20/2013   Pneumococcal-Unspecified 03/20/2013   Td 12/20/2018   Tdap 09/17/2013   Zoster, Live 08/23/2012   Pertinent  Health Maintenance Due  Topic Date Due   INFLUENZA VACCINE  01/23/2022   MAMMOGRAM  10/19/2022   DEXA SCAN  Completed      12/30/2020    3:43 PM 06/29/2021    3:24 PM 09/14/2021    1:36 PM 12/28/2021   11:04 AM 05/25/2022    2:50 PM  Fall Risk  Falls in the past year? 0 0  0 0  Was there an injury with Fall? 0 0  0 0  Fall Risk Category Calculator 0 0  0 0  Fall Risk Category Low Low  Low Low  Patient Fall Risk Level Low fall risk Low fall risk Moderate fall risk Low fall risk Low fall risk  Patient at Risk for Falls  Due to No Fall Risks No Fall Risks  No Fall Risks No Fall Risks  Fall risk Follow up Falls evaluation completed Falls evaluation completed;Education provided;Falls prevention discussed  Falls evaluation completed Falls evaluation completed   Functional Status Survey:    Vitals:   05/25/22 1448  BP: (!) 140/70  Pulse: 81  Resp: 16  Temp: 97.9 F (36.6 C)  SpO2: 98%  Weight: 177 lb 9.6 oz (80.6 kg)  Height: 5' 4" (1.626 m)   Body mass index is 30.48 kg/m. Physical Exam Vitals reviewed.  Constitutional:      General: She is not in acute distress.    Appearance: Normal appearance. She is obese. She is not ill-appearing or diaphoretic.  HENT:     Head: Normocephalic.     Right Ear: Tympanic membrane, ear canal and external ear normal. There is no impacted cerumen.     Left Ear: Tympanic membrane, ear canal and external ear normal. There is no impacted cerumen.     Nose: Congestion and rhinorrhea present.     Right Turbinates: Swollen. Not enlarged or pale.      Left Turbinates: Not enlarged, swollen or pale.     Right Sinus: No maxillary sinus tenderness or frontal sinus tenderness.     Left Sinus: No maxillary sinus tenderness or frontal sinus tenderness.     Mouth/Throat:     Mouth: Mucous membranes are moist.     Pharynx: Oropharynx is clear. No oropharyngeal exudate or posterior oropharyngeal erythema.  Eyes:     General: No scleral icterus.       Right eye: No discharge.        Left eye: No discharge.     Conjunctiva/sclera: Conjunctivae normal.     Pupils: Pupils are equal, round, and reactive to light.  Neck:     Vascular: No carotid bruit.  Cardiovascular:     Rate and Rhythm: Normal rate and regular rhythm.     Pulses: Normal pulses.     Heart sounds: Normal heart sounds. No murmur heard.    No friction rub. No gallop.  Pulmonary:     Effort: Pulmonary effort is normal. No respiratory distress.     Breath sounds: Wheezing present. No rhonchi or rales.  Chest:     Chest wall: No tenderness.  Abdominal:     General: Bowel sounds are normal. There is no distension.     Palpations: Abdomen is soft. There is no mass.     Tenderness: There is no abdominal tenderness. There is no right CVA tenderness, left CVA tenderness, guarding or rebound.  Musculoskeletal:        General: No swelling or tenderness. Normal range of motion.     Cervical back: Normal range of motion. No rigidity or tenderness.     Right lower leg: No edema.     Left lower leg: No edema.  Lymphadenopathy:     Cervical: No cervical adenopathy.  Skin:    General: Skin is warm and dry.     Coloration: Skin is not pale.     Findings: No bruising, erythema, lesion or rash.  Neurological:     Mental Status: She is alert and oriented to person, place, and time.     Cranial Nerves: No cranial nerve deficit.     Sensory: No sensory deficit.     Motor: No weakness.     Coordination: Coordination normal.     Gait: Gait abnormal.  Psychiatric:  Mood and Affect:  Mood normal.        Speech: Speech normal.        Behavior: Behavior normal.    Labs reviewed: Recent Labs    06/28/21 0841 10/05/21 1511  NA 138 137  K 4.9 4.7  CL 104 106  CO2 24 24  GLUCOSE 87 83  BUN 20 18  CREATININE 1.02* 0.86  CALCIUM 9.0 8.9   Recent Labs    06/28/21 0841  AST 17  ALT 15  BILITOT 0.5  PROT 6.8   Recent Labs    06/28/21 0841 10/05/21 1511 12/28/21 1143  WBC 5.7 5.3 5.0  NEUTROABS 2,491 2,332 2,105  HGB 14.0 13.3 13.1  HCT 43.8 41.4 40.1  MCV 90.9 88.5 88.1  PLT 254 222 238   Lab Results  Component Value Date   TSH 1.24 08/09/2021   Lab Results  Component Value Date   HGBA1C 5.6 08/15/2020   Lab Results  Component Value Date   CHOL 152 06/28/2021   HDL 59 06/28/2021   LDLCALC 75 06/28/2021   TRIG 98 06/28/2021   CHOLHDL 2.6 06/28/2021    Significant Diagnostic Results in last 30 days:  No results found.  Assessment/Plan  1. Dizziness Suspect due to inner ear.will rule out other acute or metabolic etiologies. Recommend PT if not resolved.  - CBC with Differential/Platelet - CMP with eGFR([facility])  2. Shortness of breath on exertion Afebrile  Bilateral lung diminished wheezes noted  - DG Chest 2 View; Future  3. Nausea Had nausea this morning but has resolved  - CBC with Differential/Platelet - CMP with eGFR([facility])  4. Abdominal aortic aneurysm (AAA) without rupture, unspecified part ([place])  Infrarenal abdominal aortic ectasia and Ectatic abdominal aorta at risk for aneurysm development noted on CT scan done 01/24/2019. Recommend followup by ultrasound in 5 years.Patient request referral to vascular for evaluation. - Ambulatory referral to Vascular Surgery  5. Nasal congestion Right nare mucosa swollen.No sinus tenderness on percussion. Flonase and Loratadine as below.  - fluticasone (FLONASE) 50 MCG/ACT nasal spray; Place 2 sprays into both nostrils daily.  Dispense: 16 g; Refill: 6 - loratadine (CLARITIN) 10 MG  tablet; Take 1 tablet (10 mg total) by mouth daily.  Dispense: 30 tablet; Refill: 11 - sodium chloride (OCEAN) 0.65 % nasal spray; Place 1 spray into the nose as needed for congestion.  Dispense: 30 mL; Refill: 12  Family/ staff Communication: Reviewed plan of care with patient verbalized understanding  Labs/tests ordered:  - CBC with Differential/Platelet - CMP with eGFR([facility]) - DG Chest 2 View; Future  Next Appointment:Return if symptoms worsen or fail to improve.   Sandrea Hughs, NP

## 2022-05-26 LAB — CBC WITH DIFFERENTIAL/PLATELET
Absolute Monocytes: 759 cells/uL (ref 200–950)
Basophils Absolute: 37 cells/uL (ref 0–200)
Basophils Relative: 0.5 %
Eosinophils Absolute: 336 cells/uL (ref 15–500)
Eosinophils Relative: 4.6 %
HCT: 41.3 % (ref 35.0–45.0)
Hemoglobin: 13.4 g/dL (ref 11.7–15.5)
Lymphs Abs: 2161 cells/uL (ref 850–3900)
MCH: 28.1 pg (ref 27.0–33.0)
MCHC: 32.4 g/dL (ref 32.0–36.0)
MCV: 86.6 fL (ref 80.0–100.0)
MPV: 11.1 fL (ref 7.5–12.5)
Monocytes Relative: 10.4 %
Neutro Abs: 4008 cells/uL (ref 1500–7800)
Neutrophils Relative %: 54.9 %
Platelets: 257 10*3/uL (ref 140–400)
RBC: 4.77 10*6/uL (ref 3.80–5.10)
RDW: 13.2 % (ref 11.0–15.0)
Total Lymphocyte: 29.6 %
WBC: 7.3 10*3/uL (ref 3.8–10.8)

## 2022-05-26 LAB — COMPLETE METABOLIC PANEL WITH GFR
AG Ratio: 1.4 (calc) (ref 1.0–2.5)
ALT: 12 U/L (ref 6–29)
AST: 17 U/L (ref 10–35)
Albumin: 4.2 g/dL (ref 3.6–5.1)
Alkaline phosphatase (APISO): 72 U/L (ref 37–153)
BUN: 18 mg/dL (ref 7–25)
CO2: 23 mmol/L (ref 20–32)
Calcium: 9.2 mg/dL (ref 8.6–10.4)
Chloride: 104 mmol/L (ref 98–110)
Creat: 0.85 mg/dL (ref 0.60–0.95)
Globulin: 2.9 g/dL (calc) (ref 1.9–3.7)
Glucose, Bld: 108 mg/dL (ref 65–139)
Potassium: 4.9 mmol/L (ref 3.5–5.3)
Sodium: 136 mmol/L (ref 135–146)
Total Bilirubin: 0.3 mg/dL (ref 0.2–1.2)
Total Protein: 7.1 g/dL (ref 6.1–8.1)
eGFR: 67 mL/min/{1.73_m2} (ref >=60)

## 2022-05-28 ENCOUNTER — Other Ambulatory Visit: Payer: Self-pay | Admitting: Orthopedic Surgery

## 2022-05-28 DIAGNOSIS — G8929 Other chronic pain: Secondary | ICD-10-CM

## 2022-06-26 ENCOUNTER — Other Ambulatory Visit: Payer: Self-pay | Admitting: Orthopedic Surgery

## 2022-06-26 DIAGNOSIS — G8929 Other chronic pain: Secondary | ICD-10-CM

## 2022-07-05 ENCOUNTER — Ambulatory Visit: Payer: Medicare HMO | Admitting: Orthopedic Surgery

## 2022-07-11 ENCOUNTER — Encounter: Payer: Self-pay | Admitting: Vascular Surgery

## 2022-07-11 ENCOUNTER — Ambulatory Visit: Payer: Medicare HMO | Admitting: Vascular Surgery

## 2022-07-11 ENCOUNTER — Other Ambulatory Visit: Payer: Self-pay

## 2022-07-11 ENCOUNTER — Ambulatory Visit (INDEPENDENT_AMBULATORY_CARE_PROVIDER_SITE_OTHER): Payer: Medicare HMO

## 2022-07-11 VITALS — BP 128/74 | HR 80 | Temp 98.2°F | Ht 64.0 in | Wt 177.2 lb

## 2022-07-11 DIAGNOSIS — I714 Abdominal aortic aneurysm, without rupture, unspecified: Secondary | ICD-10-CM | POA: Diagnosis not present

## 2022-07-11 DIAGNOSIS — Z8679 Personal history of other diseases of the circulatory system: Secondary | ICD-10-CM

## 2022-07-11 NOTE — Progress Notes (Signed)
Vascular and Vein Specialist of [place]  Patient name: Julie Park MRN: [id number] DOB: Jun 18, 1934 Sex: female  REASON FOR CONSULT: Follow-up history of gum aortic aneurysm  HPI: Julie Park is a 89 y.o. female, who is here today for evaluation of history of abdominal aortic aneurysm.  She is here today with her daughter.  She underwent CT scan in 2020 and at that time there was suggestion of ectasia of her aorta with a maximal diameter of 2.6 cm.  She has not had any follow-up since this.  He has no history of peripheral vascular occlusive disease.  Past Medical History:  Diagnosis Date   Anxiety    Chest pain 02/08/15   ETT normal   Diverticulosis    Diverticulitis (1982)   Fracture of rib of right side 08/16/2013   GERD (gastroesophageal reflux disease)    Hyperlipidemia, mixed    Hypothyroidism    IBS (irritable bowel syndrome)    Morton's neuroma    Right lumbar radiculopathy    Intermittent x 2 episodes in summer 2014   Shingles 1967 and 2010   Ulcer    Urine incontinence    Vertigo     Family History  Problem Relation Age of Onset   Tuberculosis Mother    Pancreatic cancer Father    Asthma Daughter    Heart attack Paternal Grandfather     SOCIAL HISTORY: Social History   Socioeconomic History   Marital status: Single    Spouse name: Not on file   Number of children: Not on file   Years of education: Not on file   Highest education level: Not on file  Occupational History   Occupation: Retired  Tobacco Use   Smoking status: Former    Packs/day: 1.00    Years: 8.00    Total pack years: 8.00    Types: Cigarettes    Start date: 06/26/1983   Smokeless tobacco: Never  Vaping Use   Vaping Use: Never used  Substance and Sexual Activity   Alcohol use: No   Drug use: No   Sexual activity: Not Currently  Other Topics Concern   Not on file  Social History Narrative   Divorced, 2 children.   Lives in [place].    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   Tobacco: small amount, distant past.  Alcohol: none.           Social History      Diet? n/a      Do you drink/eat things with caffeine? coffee      Marital status?     Divorced (1984)                              What year were you married? 1958      Do you live in a house, apartment, assisted living, condo, trailer, etc.? house      Is it one or more stories?  yes      How many persons live in your home? Living with daughter and husband at the present      Do you have any pets in your home? (please list) no       Highest level of education completed? 16 years      Current or past profession: teacher grades 1 and 2, education coordinator [employer]  Do you exercise?                   yes                   Type & how often? Walk 1-3 times per week      Advanced Directives      Do you have a living will? yes      Do you have a DNR form?                                  If not, do you want to discuss one?      Do you have signed POA/HPOA for forms? yes      Functional Status      Do you have difficulty bathing or dressing yourself?      Do you have difficulty preparing food or eating?       Do you have difficulty managing your medications?      Do you have difficulty managing your finances?      Do you have difficulty affording your medications?      Social Determinants of Health   Financial Resource Strain: Not on file  Food Insecurity: Not on file  Transportation Needs: Not on file  Physical Activity: Unknown (07/10/2018)   Exercise Vital Sign    Days of Exercise per Week: 0 days    Minutes of Exercise per Session: Not on file  Stress: Not on file  Social Connections: Not on file  Intimate Partner Violence: Not on file    No Known Allergies  Current Outpatient Medications  Medication Sig Dispense Refill   acetaminophen (TYLENOL)  325 MG tablet Take 2 tablets (650 mg total) by mouth every 6 (six) hours as needed. 30 tablet 0   aspirin 81 MG tablet Take 81 mg by mouth every morning.      atorvastatin (LIPITOR) 10 MG tablet TAKE ONE TABLET BY MOUTH ONCE DAILY 90 tablet 2   Fiber POWD Take by mouth.     fluticasone (FLONASE) 50 MCG/ACT nasal spray Place 2 sprays into both nostrils daily. 16 g 6   furosemide (LASIX) 20 MG tablet TAKE 1 TABLET (20 MG TOTAL) BY MOUTH DAILY AS NEEDED FOR EDEMA. 90 tablet 1   gabapentin (NEURONTIN) 300 MG capsule Take 1 capsule (300 mg total) by mouth 3 (three) times daily. 90 capsule 11   levothyroxine (SYNTHROID) 100 MCG tablet TAKE 1 TABLET BY MOUTH EVERY DAY 90 tablet 3   loratadine (CLARITIN) 10 MG tablet Take 1 tablet (10 mg total) by mouth daily. 30 tablet 11   LORazepam (ATIVAN) 1 MG tablet TAKE ONE TABLET BY MOUTH EVERY 8 HOURS AS NEEDED FOR ANXIETY 90 tablet 5   Magnesium 250 MG TABS Take 2 tablets by mouth daily.      nystatin cream (MYCOSTATIN) Apply 1 application. topically as needed for dry skin.     omeprazole (PRILOSEC) 40 MG capsule TAKE ONE CAPSULE BY MOUTH ONCE DAILY 90 capsule 3   predniSONE (DELTASONE) 10 MG tablet Take 1 tablet (10 mg total) by mouth 3 (three) times daily. 42 tablet 0   sodium chloride (OCEAN) 0.65 % nasal spray Place 1 spray into the nose as needed for congestion. 30 mL 12   traMADol (ULTRAM) 50 MG tablet TAKE 1/2 TABLET BY MOUTH DAILY 15 tablet 0   Current Facility-Administered Medications  Medication Dose  Route Frequency Provider Last Rate Last Admin   Romosozumab-aqqg (EVENITY) 105 MG/1.17ML injection 210 mg  210 mg Subcutaneous Once Reed, Tiffany L, DO        REVIEW OF SYSTEMS:  '[X]'$  denotes positive finding, '[ ]'$  denotes negative finding Cardiac  Comments:  Chest pain or chest pressure:    Shortness of breath upon exertion: x   Short of breath when lying flat:    Irregular heart rhythm:        Vascular    Pain in calf, thigh, or hip brought on  by ambulation:    Pain in feet at night that wakes you up from your sleep:     Blood clot in your veins:    Leg swelling:         Pulmonary    Oxygen at home:    Productive cough:     Wheezing:  x       Neurologic    Sudden weakness in arms or legs:     Sudden numbness in arms or legs:     Sudden onset of difficulty speaking or slurred speech:    Temporary loss of vision in one eye:     Problems with dizziness:  x       Gastrointestinal    Blood in stool:     Vomited blood:         Genitourinary    Burning when urinating:     Blood in urine:        Psychiatric    Major depression:         Hematologic    Bleeding problems:    Problems with blood clotting too easily:        Skin    Rashes or ulcers:        Constitutional    Fever or chills:      PHYSICAL EXAM: Vitals:   07/11/22 0924  BP: 128/74  Pulse: 80  Temp: 98.2 F (36.8 C)  Weight: 177 lb 3.2 oz (80.4 kg)  Height: '5\' 4"'$  (1.626 m)    GENERAL: The patient is a well-nourished female, in no acute distress. The vital signs are documented above. CARDIOVASCULAR: 2+ radial and 2+ popliteal pulses bilaterally with no evidence of peripheral aneurysm PULMONARY: There is good air exchange  MUSCULOSKELETAL: There are no major deformities or cyanosis. NEUROLOGIC: No focal weakness or paresthesias are detected. SKIN: There are no ulcers or rashes noted. PSYCHIATRIC: The patient has a normal affect.  DATA:  She underwent duplex today in our office showing no evidence of aneurysm.  The maximal diameter of her aorta was 2.2 cm.  I reviewed her CT scan images with the patient and her daughter from 2020.  This does show very mild ectasia of her infrarenal aorta with a slight rim of mural thrombus.  MEDICAL ISSUES: No evidence of abdominal aortic aneurysm with minimal ectasia on her CT scan from 2020.  I explained that she does not have an aneurysm and that I would not recommend any follow-up with a near normal aorta.   She will see [place] again on an as-needed basis   Rosetta Posner, MD [facility] Tel [phone] Pager [phone]  Note: Portions of this report may have been transcribed using voice recognition software.  Every effort has been made to ensure accuracy; however, inadvertent computerized transcription errors may still be present.

## 2022-07-18 NOTE — Patient Instructions (Signed)
Please schedule Medicare Annual Wellness Visit.

## 2022-07-19 ENCOUNTER — Encounter: Payer: Self-pay | Admitting: Orthopedic Surgery

## 2022-07-19 ENCOUNTER — Ambulatory Visit: Payer: Medicare HMO | Admitting: Orthopedic Surgery

## 2022-07-19 VITALS — BP 120/78 | HR 85 | Temp 97.6°F | Resp 18 | Ht 64.0 in | Wt 180.0 lb

## 2022-07-19 DIAGNOSIS — I10 Essential (primary) hypertension: Secondary | ICD-10-CM

## 2022-07-19 DIAGNOSIS — E782 Mixed hyperlipidemia: Secondary | ICD-10-CM

## 2022-07-19 DIAGNOSIS — B372 Candidiasis of skin and nail: Secondary | ICD-10-CM

## 2022-07-19 DIAGNOSIS — M810 Age-related osteoporosis without current pathological fracture: Secondary | ICD-10-CM

## 2022-07-19 DIAGNOSIS — K219 Gastro-esophageal reflux disease without esophagitis: Secondary | ICD-10-CM

## 2022-07-19 DIAGNOSIS — E039 Hypothyroidism, unspecified: Secondary | ICD-10-CM

## 2022-07-19 DIAGNOSIS — R0789 Other chest pain: Secondary | ICD-10-CM

## 2022-07-19 DIAGNOSIS — G8929 Other chronic pain: Secondary | ICD-10-CM

## 2022-07-19 DIAGNOSIS — R42 Dizziness and giddiness: Secondary | ICD-10-CM | POA: Diagnosis not present

## 2022-07-19 DIAGNOSIS — F419 Anxiety disorder, unspecified: Secondary | ICD-10-CM

## 2022-07-19 DIAGNOSIS — M5442 Lumbago with sciatica, left side: Secondary | ICD-10-CM

## 2022-07-19 DIAGNOSIS — M5441 Lumbago with sciatica, right side: Secondary | ICD-10-CM

## 2022-07-19 DIAGNOSIS — I714 Abdominal aortic aneurysm, without rupture, unspecified: Secondary | ICD-10-CM | POA: Diagnosis not present

## 2022-07-19 MED ORDER — DENOSUMAB 60 MG/ML ~~LOC~~ SOSY
60.0000 mg | PREFILLED_SYRINGE | Freq: Once | SUBCUTANEOUS | Status: AC
  Administered 2022-07-19: 60 mg via SUBCUTANEOUS

## 2022-07-19 MED ORDER — NYSTATIN 100000 UNIT/GM EX CREA: 1.0000 | TOPICAL_CREAM | CUTANEOUS | 3 refills | Status: DC | PRN

## 2022-07-19 NOTE — Progress Notes (Signed)
Careteam: Patient Care Team: Yvonna Alanis, NP as PCP - General (Adult Health Nurse Practitioner) Monna Fam, MD as Consulting Physician (Ophthalmology) Gerarda Fraction, MD as Referring Physician (Ophthalmology) Carole Civil, MD as Consulting Physician (Orthopedic Surgery)  Seen by: Windell Moulding, AGNP-C  PLACE OF SERVICE:  [place] Directive information    No Known Allergies  Chief Complaint  Patient presents with   Medical Management of Chronic Issues    Six month Follow-up and fasting lab with Sign Contract for Prolia injection      HPI: Patient is a 89 y.o. female seen today for medical management of chronic conditions.   Prolia injection today.   MMSE score 30/30, correct sentence and shapes.   Dizziness- evaluated by ENT> normal exam> no new episodes> improved  Chest tightness- she was walking to bird feeder and felt sob for with chest tightness, has occurred intermittently for years, does not want EKG today, requesting cardiology referral   AAA- noted CT scan> Infrarenal abdominal aortic ectasia and Ectatic abdominal aorta at risk for aneurysm development 01/24/2019, f/u ultrasound in 5 years recommended, referral to vascular surgery made 05/25/2022> seen 07/11/2022 by Dr. Donnetta Hutching no sign of AAA> does not recommend f/u studies  No recent falls or injuries.   Appetite fair.   Good family support.   Discussed AWV.     Review of Systems:  Review of Systems  Constitutional:  Negative for chills, fever, malaise/fatigue and weight loss.  HENT:  Negative for hearing loss and sore throat.   Eyes:  Negative for blurred vision.  Respiratory:  Negative for cough, shortness of breath and wheezing.   Cardiovascular:  Positive for chest pain. Negative for leg swelling.  Gastrointestinal:  Negative for abdominal pain, constipation, diarrhea, heartburn, nausea and vomiting.  Genitourinary:  Negative for dysuria and frequency.  Musculoskeletal:   Negative for back pain, falls and joint pain.  Skin:  Positive for rash.  Neurological:  Negative for dizziness, weakness and headaches.  Psychiatric/Behavioral:  Positive for depression. Negative for memory loss. The patient is nervous/anxious. The patient does not have insomnia.     Past Medical History:  Diagnosis Date   Anxiety    Chest pain 02/08/15   ETT normal   Diverticulosis    Diverticulitis (1982)   Fracture of rib of right side 08/16/2013   GERD (gastroesophageal reflux disease)    Hyperlipidemia, mixed    Hypothyroidism    IBS (irritable bowel syndrome)    Morton's neuroma    Right lumbar radiculopathy    Intermittent x 2 episodes in summer 2014   Shingles 1967 and 2010   Ulcer    Urine incontinence    Vertigo    Past Surgical History:  Procedure Laterality Date   CARDIOVASCULAR STRESS TEST  02/08/15   ETT normal   CATARACT EXTRACTION  2012   Dr. Herbert Deaner   COLONOSCOPY  2005   Dr. Arsenio Loader at [facility] (normal per pt report)   DEXA  11/03/13   Heel T score -0.8.   [place]   right foot   TONSILLECTOMY     TONSILLECTOMY AND ADENOIDECTOMY  1948   Dr. [place]   WISDOM TOOTH EXTRACTION     Social History:   reports that she has quit smoking. Her smoking use included cigarettes. She started smoking about 39 years ago. She has a 8.00 pack-year smoking history. She has never  used smokeless tobacco. She reports that she does not drink alcohol and does not use drugs.  Family History  Problem Relation Age of Onset   Tuberculosis Mother    Pancreatic cancer Father    Asthma Daughter    Heart attack Paternal Grandfather     Medications: Patient's Medications  New Prescriptions   No medications on file  Previous Medications   ACETAMINOPHEN (TYLENOL) 325 MG TABLET    Take 2 tablets (650 mg total) by mouth every 6 (six) hours as needed.   ASPIRIN 81 MG TABLET    Take 81 mg by mouth every morning.     ATORVASTATIN (LIPITOR) 10 MG TABLET    TAKE ONE TABLET BY MOUTH ONCE DAILY   FIBER POWD    Take by mouth.   FLUTICASONE (FLONASE) 50 MCG/ACT NASAL SPRAY    Place 2 sprays into both nostrils daily.   FUROSEMIDE (LASIX) 20 MG TABLET    TAKE 1 TABLET (20 MG TOTAL) BY MOUTH DAILY AS NEEDED FOR EDEMA.   GABAPENTIN (NEURONTIN) 300 MG CAPSULE    Take 1 capsule (300 mg total) by mouth 3 (three) times daily.   LEVOTHYROXINE (SYNTHROID) 100 MCG TABLET    TAKE 1 TABLET BY MOUTH EVERY DAY   LORATADINE (CLARITIN) 10 MG TABLET    Take 1 tablet (10 mg total) by mouth daily.   LORAZEPAM (ATIVAN) 1 MG TABLET    TAKE ONE TABLET BY MOUTH EVERY 8 HOURS AS NEEDED FOR ANXIETY   MAGNESIUM 250 MG TABS    Take 2 tablets by mouth daily.    NYSTATIN CREAM (MYCOSTATIN)    Apply 1 application. topically as needed for dry skin.   OMEPRAZOLE (PRILOSEC) 40 MG CAPSULE    TAKE ONE CAPSULE BY MOUTH ONCE DAILY   PREDNISONE (DELTASONE) 10 MG TABLET    Take 1 tablet (10 mg total) by mouth 3 (three) times daily.   SODIUM CHLORIDE (OCEAN) 0.65 % NASAL SPRAY    Place 1 spray into the nose as needed for congestion.   TRAMADOL (ULTRAM) 50 MG TABLET    TAKE 1/2 TABLET BY MOUTH DAILY  Modified Medications   No medications on file  Discontinued Medications   No medications on file    Physical Exam:  There were no vitals filed for this visit. There is no height or weight on file to calculate BMI. Wt Readings from Last 3 Encounters:  07/11/22 177 lb 3.2 oz (80.4 kg)  05/25/22 177 lb 9.6 oz (80.6 kg)  03/05/22 171 lb (77.6 kg)    Physical Exam Vitals reviewed.  Constitutional:      Appearance: She is obese.  HENT:     Head: Normocephalic.  Eyes:     General:        Right eye: No discharge.        Left eye: No discharge.  Neck:     Vascular: No carotid bruit.     Comments: No JVD Cardiovascular:     Rate and Rhythm: Normal rate and regular rhythm.     Pulses: Normal pulses.     Heart sounds: Normal heart sounds.   Pulmonary:     Effort: Pulmonary effort is normal.     Breath sounds: Normal breath sounds.  Abdominal:     General: Bowel sounds are normal. There is no distension.     Palpations: Abdomen is soft.     Tenderness: There is no abdominal tenderness.  Musculoskeletal:     Cervical back:  Neck supple.     Right lower leg: No edema.     Left lower leg: No edema.  Lymphadenopathy:     Cervical: No cervical adenopathy.  Skin:    General: Skin is warm and dry.     Capillary Refill: Capillary refill takes less than 2 seconds.     Comments: Excoriated skin under breasts  Neurological:     General: No focal deficit present.     Mental Status: She is alert and oriented to person, place, and time.     Motor: Weakness present.     Gait: Gait abnormal.     Comments: Cane  Psychiatric:        Mood and Affect: Mood normal.        Behavior: Behavior normal.     Labs reviewed: Basic Metabolic Panel: Recent Labs    08/09/21 0831 10/05/21 1511 05/25/22 1537  NA  --  137 136  K  --  4.7 4.9  CL  --  106 104  CO2  --  24 23  GLUCOSE  --  83 108  BUN  --  18 18  CREATININE  --  0.86 0.85  CALCIUM  --  8.9 9.2  TSH 1.24  --   --    Liver Function Tests: Recent Labs    05/25/22 1537  AST 17  ALT 12  BILITOT 0.3  PROT 7.1   No results for input(s): "LIPASE", "AMYLASE" in the last 8760 hours. No results for input(s): "AMMONIA" in the last 8760 hours. CBC: Recent Labs    10/05/21 1511 12/28/21 1143 05/25/22 1537  WBC 5.3 5.0 7.3  NEUTROABS 2,332 2,105 4,008  HGB 13.3 13.1 13.4  HCT 41.4 40.1 41.3  MCV 88.5 88.1 86.6  PLT 222 238 257   Lipid Panel: No results for input(s): "CHOL", "HDL", "LDLCALC", "TRIG", "CHOLHDL", "LDLDIRECT" in the last 8760 hours. TSH: Recent Labs    08/09/21 0831  TSH 1.24   A1C: Lab Results  Component Value Date   HGBA1C 5.6 08/15/2020     Assessment/Plan 1. Senile osteoporosis - DEXA 2020 - denosumab (PROLIA) injection 60 mg  2.  Dizziness - evaluated by ENT - no recent episodes  3. Abdominal aortic aneurysm (AAA) without rupture, unspecified part ([place]) - noted on CT 01/24/2019 - 01/17 seen by Dr. Donnetta Hutching with vascular> no AAA> no f/u  4. Primary hypertension - controlled - cont furosemide  5. Mixed hyperlipidemia - LDL stable - cont atorvastatin  6. Acquired hypothyroidism - TSH stable - cont levothyroxine  7. Gastroesophageal reflux disease without esophagitis - hgb stable - cont omeprazole  8. Chronic midline low back pain with bilateral sciatica - ongoing - cont gabapentin  9. Anxiety - no panic attacks - cont ativan   10. Chest tightness - exam unremarkable, deferred EKG - intermittent chest tightness for years> with exertion - Ambulatory referral to Cardiology  11. Candidiasis of skin - nystatin cream (MYCOSTATIN); Apply 1 Application topically as needed for dry skin.  Dispense: 30 g; Refill: 3  Future labs/tests: Need DEXA, cbc/diff, cmp, TSH, lipid panel  Total time: 34 minutes. Greater than 50% of total time spent doing patient education regarding health maintenance, osteoporosis, HTN, chest tightness, HTN, HLD and anxiety.   Next appt: Visit date not found  [place], [facility] [phone]

## 2022-07-25 ENCOUNTER — Other Ambulatory Visit: Payer: Self-pay | Admitting: Orthopedic Surgery

## 2022-07-25 DIAGNOSIS — G8929 Other chronic pain: Secondary | ICD-10-CM

## 2022-08-04 ENCOUNTER — Other Ambulatory Visit: Payer: Self-pay | Admitting: Orthopedic Surgery

## 2022-08-17 ENCOUNTER — Telehealth: Payer: Medicare HMO | Admitting: *Deleted

## 2022-08-17 DIAGNOSIS — F419 Anxiety disorder, unspecified: Secondary | ICD-10-CM

## 2022-08-17 MED ORDER — LORAZEPAM 1 MG PO TABS: 1.0000 mg | ORAL_TABLET | Freq: Three times a day (TID) | ORAL | 0 refills | Status: DC | PRN

## 2022-08-17 NOTE — Telephone Encounter (Signed)
Yvonna Alanis, NP  You25 minutes ago (12:31 PM)   Prescription sent.    Daughter Notified.

## 2022-08-17 NOTE — Telephone Encounter (Signed)
Patient daughter, Mardene Celeste called and stated that patient is out of town and forgot her Ativan. Patient is getting irritable so daughter is wanting to know if #4 can be called to Pharmacy  [facility] [address] [address] in [place].   Pended Rx and sent to Windell Moulding, NP for approval.

## 2022-08-20 ENCOUNTER — Encounter: Payer: Self-pay | Admitting: *Deleted

## 2022-08-20 ENCOUNTER — Ambulatory Visit: Payer: Medicare HMO | Attending: Internal Medicine | Admitting: Internal Medicine

## 2022-08-20 ENCOUNTER — Encounter: Payer: Self-pay | Admitting: Internal Medicine

## 2022-08-20 VITALS — BP 136/74 | HR 103 | Ht 64.0 in | Wt 175.2 lb

## 2022-08-20 DIAGNOSIS — R079 Chest pain, unspecified: Secondary | ICD-10-CM

## 2022-08-20 DIAGNOSIS — M79606 Pain in leg, unspecified: Secondary | ICD-10-CM | POA: Diagnosis not present

## 2022-08-20 MED ORDER — NITROGLYCERIN 0.4 MG SL SUBL: 0.4000 mg | SUBLINGUAL_TABLET | SUBLINGUAL | 3 refills | Status: DC | PRN

## 2022-08-20 MED ORDER — METOPROLOL TARTRATE 25 MG PO TABS: 25.0000 mg | ORAL_TABLET | Freq: Two times a day (BID) | ORAL | 1 refills | Status: DC

## 2022-08-20 NOTE — Progress Notes (Signed)
Cardiology Office Note  Date: 08/20/2022   ID: Julie Park, DOB [birth year], MRN [id number]  PCP:  Yvonna Alanis, NP  Cardiologist:  Chalmers Guest, MD Electrophysiologist:  None   Reason for Office Visit: Evaluation of chest pain at the request of [name], NP   History of Present Illness: Julie Park is a 89 y.o. female known to have hypothyroidism, HLD was referred to cardiology clinic for evaluation of chest pain.  Patient reported having substernal chest tightness radiating to left neck and left jaw x started 2 months ago, frequency once per week, lasts for 15 minutes, occurs with exertion (while washing dishes) and resolves with rest.  Associated with SOB.  Denies any DOE, lightheadedness, dizziness, leg swelling and claudication. She does have vertigo for which she follows with ENT. She also was getting cramps at nighttime especially in her groins.  Has been getting her feet/legs numb recently along with pain in her legs.  Denies smoking cigarettes.  She underwent stress test many many years ago, especially in the early 2000's and was told it was normal.  She did not undergo LHC in the past.  Past Medical History:  Diagnosis Date   Anxiety    Chest pain 02/08/15   ETT normal   Diverticulosis    Diverticulitis (1982)   Fracture of rib of right side 08/16/2013   GERD (gastroesophageal reflux disease)    Hyperlipidemia, mixed    Hypothyroidism    IBS (irritable bowel syndrome)    Morton's neuroma    Right lumbar radiculopathy    Intermittent x 2 episodes in summer 2014   Shingles 1967 and 2010   Ulcer    Urine incontinence    Vertigo     Past Surgical History:  Procedure Laterality Date   CARDIOVASCULAR STRESS TEST  02/08/15   ETT normal   CATARACT EXTRACTION  2012   Dr. Herbert Deaner   COLONOSCOPY  2005   [facility]. Arsenio Loader at [facility] (normal per pt report)   DEXA  11/03/13   Heel T score -0.8.   [place]   right foot   TONSILLECTOMY      TONSILLECTOMY AND ADENOIDECTOMY  1948   Dr. [place]   WISDOM TOOTH EXTRACTION      Current Outpatient Medications  Medication Sig Dispense Refill   acetaminophen (TYLENOL) 325 MG tablet Take 2 tablets (650 mg total) by mouth every 6 (six) hours as needed. 30 tablet 0   aspirin 81 MG tablet Take 81 mg by mouth every morning.      atorvastatin (LIPITOR) 10 MG tablet TAKE ONE TABLET BY MOUTH ONCE DAILY 90 tablet 1   Fiber POWD Take by mouth.     fluticasone (FLONASE) 50 MCG/ACT nasal spray Place 2 sprays into both nostrils daily. 16 g 6   gabapentin (NEURONTIN) 300 MG capsule Take 1 capsule (300 mg total) by mouth 3 (three) times daily. 90 capsule 11   levothyroxine (SYNTHROID) 100 MCG tablet TAKE 1 TABLET BY MOUTH EVERY DAY 90 tablet 3   loratadine (CLARITIN) 10 MG tablet Take 1 tablet (10 mg total) by mouth daily. 30 tablet 11   LORazepam (ATIVAN) 1 MG tablet Take 1 tablet (1 mg total) by mouth every 8 (eight) hours as needed. for anxiety 4 tablet 0   Magnesium 250 MG TABS Take 2 tablets by mouth daily.      metoprolol tartrate (LOPRESSOR) 25  MG tablet Take 1 tablet (25 mg total) by mouth 2 (two) times daily. 180 tablet 1   nitroGLYCERIN (NITROSTAT) 0.4 MG SL tablet Place 1 tablet (0.4 mg total) under the tongue every 5 (five) minutes x 3 doses as needed for chest pain (if no relief after 3rd dose, proceed to ED or call 911). 25 tablet 3   nystatin cream (MYCOSTATIN) Apply 1 Application topically as needed for dry skin. 30 g 3   omeprazole (PRILOSEC) 40 MG capsule TAKE ONE CAPSULE BY MOUTH ONCE DAILY 90 capsule 3   Propylene Glycol (SYSTANE BALANCE) 0.6 % SOLN Apply to eye daily as needed.     sodium chloride (OCEAN) 0.65 % nasal spray Place 1 spray into the nose as needed for congestion. 30 mL 12   traMADol (ULTRAM) 50 MG tablet TAKE 1/2 TABLET BY MOUTH DAILY 15 tablet 0   Current Facility-Administered Medications  Medication Dose Route Frequency  Provider Last Rate Last Admin   Romosozumab-aqqg (EVENITY) 105 MG/1.17ML injection 210 mg  210 mg Subcutaneous Once Reed, Tiffany L, DO       Allergies:  Patient has no known allergies.   Social History: The patient  reports that she has quit smoking. Her smoking use included cigarettes. She started smoking about 39 years ago. She has a 8.00 pack-year smoking history. She has never used smokeless tobacco. She reports that she does not drink alcohol and does not use drugs.   Family History: The patient's family history includes Asthma in her daughter; Heart attack in her paternal grandfather; Pancreatic cancer in her father; Tuberculosis in her mother.   ROS:  Please see the history of present illness. Otherwise, complete review of systems is positive for none.  All other systems are reviewed and negative.   Physical Exam: VS:  BP 136/74   Pulse (!) 103   Ht '5\' 4"'$  (1.626 m)   Wt 175 lb 3.2 oz (79.5 kg)   SpO2 97%   BMI 30.07 kg/m , BMI Body mass index is 30.07 kg/m.  Wt Readings from Last 3 Encounters:  08/20/22 175 lb 3.2 oz (79.5 kg)  07/19/22 180 lb (81.6 kg)  07/11/22 177 lb 3.2 oz (80.4 kg)    General: Patient appears comfortable at rest. HEENT: Conjunctiva and lids normal, oropharynx clear with moist mucosa. Neck: Supple, no elevated JVP or carotid bruits, no thyromegaly. Lungs: Clear to auscultation, nonlabored breathing at rest. Cardiac: Regular rate and rhythm, no S3 or significant systolic murmur, no pericardial rub. Abdomen: Soft, nontender, no hepatomegaly, bowel sounds present, no guarding or rebound. Extremities: No pitting edema, distal pulses 2+. Skin: Warm and dry. Musculoskeletal: No kyphosis. Neuropsychiatric: Alert and oriented x3, affect grossly appropriate.  ECG:  An ECG dated 08/20/2022 was personally reviewed today and demonstrated:  Sinus tachycardia, HR 103 bpm and no ST changes  Recent Labwork: 10/05/2021: Brain Natriuretic Peptide 43 05/25/2022: ALT  12; AST 17; BUN 18; Creat 0.85; Hemoglobin 13.4; Platelets 257; Potassium 4.9; Sodium 136     Component Value Date/Time   CHOL 152 06/28/2021 0841   TRIG 98 06/28/2021 0841   HDL 59 06/28/2021 0841   CHOLHDL 2.6 06/28/2021 0841   VLDL 25.6 11/04/2014 0946   LDLCALC 75 06/28/2021 0841    Other Studies Reviewed Today:   Assessment and Plan: Patient is a 89 year old F known to have hypothyroidism, HLD was referred to cardiology clinic for evaluation of chest pain.  # Cardiac chest pain -Obtain Lexiscan and 2D echocardiogram -Start metoprolol  tartrate 25 mg twice daily -SL NTG 0.4 mg as needed  # Bilateral lower extremity pain and numbness, rule out PAD -obtain ABI with ultrasound arterial Doppler lower extremities  # HLD -Continue atorvastatin 10 mg at at bedtime.  Goal LDL less than 100. -HLD management per PCP.  I have spent a total of 45 minutes with patient reviewing chart, EKGs, labs and examining patient as well as establishing an assessment and plan that was discussed with the patient.  > 50% of time was spent in direct patient care.     Medication Adjustments/Labs and Tests Ordered: Current medicines are reviewed at length with the patient today.  Concerns regarding medicines are outlined above.   Tests Ordered: Orders Placed This Encounter  Procedures   NM Myocar Multi W/Spect W/Wall Motion / EF   EKG 12-Lead   ECHOCARDIOGRAM COMPLETE   VAS [place] ABI WITH/WO TBI    Medication Changes: Meds ordered this encounter  Medications   metoprolol tartrate (LOPRESSOR) 25 MG tablet    Sig: Take 1 tablet (25 mg total) by mouth 2 (two) times daily.    Dispense:  180 tablet    Refill:  1    08/20/2022 NEW   nitroGLYCERIN (NITROSTAT) 0.4 MG SL tablet    Sig: Place 1 tablet (0.4 mg total) under the tongue every 5 (five) minutes x 3 doses as needed for chest pain (if no relief after 3rd dose, proceed to ED or call 911).    Dispense:  25 tablet    Refill:  3    08/20/2022 NEW     Disposition:  Follow up  1 month  Signed Zakir Henner Fidel Levy, MD, 08/20/2022 12:22 PM    [facility] [place], [place], [place] [address]

## 2022-08-20 NOTE — Patient Instructions (Addendum)
Medication Instructions:  Your physician has recommended you make the following change in your medication:  Start metoprolol tartrate 25 mg twice daily Nitroglycerin 0.4 mg sublingual tablets. Dissolve one under tongue for chest pain every 5 minutes up to 3 doses. If no relief, proceed to ED.  Labwork: none  Testing/Procedures: Your physician has requested that you have an echocardiogram. Echocardiography is a painless test that uses sound waves to create images of your heart. It provides your doctor with information about the size and shape of your heart and how well your heart's chambers and valves are working. This procedure takes approximately one hour. There are no restrictions for this procedure. Please do NOT wear cologne, perfume, aftershave, or lotions (deodorant is allowed). Please arrive 15 minutes prior to your appointment time. Your physician has requested that you have a lexiscan myoview. For further information please visit [email or web address]. Please follow instruction sheet, as given. Your physician has requested that you have an ankle brachial index (ABI). During this test an ultrasound and blood pressure cuff are used to evaluate the arteries that supply the arms and legs with blood. Allow thirty minutes for this exam. There are no restrictions or special instructions.  Follow-Up: Your physician recommends that you schedule a follow-up appointment in: 1 month  Any Other Special Instructions Will Be Listed Below (If Applicable).  If you need a refill on your cardiac medications before your next appointment, please call your pharmacy.

## 2022-08-23 ENCOUNTER — Other Ambulatory Visit: Payer: Self-pay | Admitting: Orthopedic Surgery

## 2022-08-23 ENCOUNTER — Encounter: Payer: Self-pay | Admitting: Radiology

## 2022-08-23 DIAGNOSIS — G8929 Other chronic pain: Secondary | ICD-10-CM

## 2022-08-23 NOTE — Telephone Encounter (Signed)
Pharmacy requested refill Epic LR: 07/25/2022 Contract Date:07/18/2022  Pended Rx and sent to Amy for approval.

## 2022-08-27 ENCOUNTER — Other Ambulatory Visit: Payer: Self-pay | Admitting: Family

## 2022-08-27 DIAGNOSIS — R0981 Nasal congestion: Secondary | ICD-10-CM

## 2022-08-28 ENCOUNTER — Other Ambulatory Visit: Payer: Self-pay | Admitting: Internal Medicine

## 2022-08-28 DIAGNOSIS — R079 Chest pain, unspecified: Secondary | ICD-10-CM

## 2022-08-28 DIAGNOSIS — M79606 Pain in leg, unspecified: Secondary | ICD-10-CM

## 2022-08-28 DIAGNOSIS — I739 Peripheral vascular disease, unspecified: Secondary | ICD-10-CM

## 2022-08-31 ENCOUNTER — Telehealth: Payer: Self-pay | Admitting: Internal Medicine

## 2022-08-31 NOTE — Telephone Encounter (Signed)
Checking percert on the following patient for testing scheduled at [facility].   LEXISCAN   09/05/2022

## 2022-09-05 ENCOUNTER — Encounter (HOSPITAL_COMMUNITY): Payer: Self-pay

## 2022-09-05 ENCOUNTER — Encounter (HOSPITAL_COMMUNITY)
Admission: RE | Admit: 2022-09-05 | Discharge: 2022-09-05 | Disposition: A | Discharge status: Home / Self Care | Payer: Medicare HMO | Source: Ambulatory Visit | Attending: Internal Medicine | Admitting: Internal Medicine

## 2022-09-05 ENCOUNTER — Ambulatory Visit (HOSPITAL_COMMUNITY)
Admission: RE | Admit: 2022-09-05 | Discharge: 2022-09-05 | Disposition: A | Discharge status: Home / Self Care | Payer: Medicare HMO | Source: Ambulatory Visit | Attending: Internal Medicine | Admitting: Internal Medicine

## 2022-09-05 DIAGNOSIS — R079 Chest pain, unspecified: Secondary | ICD-10-CM | POA: Insufficient documentation

## 2022-09-05 LAB — NM MYOCAR MULTI W/SPECT W/WALL MOTION / EF
Base ST Depression (mm): 0 mm
LV dias vol: 41 mL (ref 46–106)
LV sys vol: 11 mL
Nuc Stress EF: 74 %
Peak HR: 111 {beats}/min
RATE: 0.4
Rest HR: 87 {beats}/min
Rest Nuclear Isotope Dose: 10.3 mCi
SDS: 0
SRS: 0
SSS: 0
ST Depression (mm): 0 mm
Stress Nuclear Isotope Dose: 32.8 mCi
TID: 1.02

## 2022-09-05 MED ORDER — SODIUM CHLORIDE FLUSH 0.9 % IV SOLN
INTRAVENOUS | Status: AC
  Administered 2022-09-05: 10 mL via INTRAVENOUS
  Filled 2022-09-05: qty 10

## 2022-09-05 MED ORDER — TECHNETIUM TC 99M TETROFOSMIN IV KIT
30.0000 | PACK | Freq: Once | INTRAVENOUS | Status: AC | PRN
  Administered 2022-09-05: 32.8 via INTRAVENOUS

## 2022-09-05 MED ORDER — TECHNETIUM TC 99M TETROFOSMIN IV KIT
10.0000 | PACK | Freq: Once | INTRAVENOUS | Status: AC | PRN
  Administered 2022-09-05: 10.3 via INTRAVENOUS

## 2022-09-05 MED ORDER — REGADENOSON 0.4 MG/5ML IV SOLN
INTRAVENOUS | Status: AC
  Administered 2022-09-05: 0.4 mg
  Filled 2022-09-05: qty 5

## 2022-09-06 ENCOUNTER — Ambulatory Visit: Payer: Medicare HMO | Attending: Internal Medicine

## 2022-09-06 ENCOUNTER — Ambulatory Visit (INDEPENDENT_AMBULATORY_CARE_PROVIDER_SITE_OTHER): Payer: Medicare HMO

## 2022-09-06 DIAGNOSIS — R079 Chest pain, unspecified: Secondary | ICD-10-CM | POA: Diagnosis not present

## 2022-09-06 DIAGNOSIS — I739 Peripheral vascular disease, unspecified: Secondary | ICD-10-CM

## 2022-09-06 DIAGNOSIS — M79606 Pain in leg, unspecified: Secondary | ICD-10-CM

## 2022-09-06 LAB — VAS US ABI WITH/WO TBI
Left ABI: 1.38
Right ABI: 1.26

## 2022-09-10 LAB — ECHOCARDIOGRAM COMPLETE
AR max vel: 1.93 cm2
AV Peak grad: 8.5 mmHg
Ao pk vel: 1.46 m/s
Area-P 1/2: 3.28 cm2
Calc EF: 60 %
MV M vel: 1.83 m/s
MV Peak grad: 13.4 mmHg
S' Lateral: 1.5 cm
Single Plane A2C EF: 61 %
Single Plane A4C EF: 58.9 %

## 2022-09-27 ENCOUNTER — Encounter: Payer: Self-pay | Admitting: Orthopedic Surgery

## 2022-09-27 ENCOUNTER — Ambulatory Visit (INDEPENDENT_AMBULATORY_CARE_PROVIDER_SITE_OTHER): Payer: Medicare HMO | Admitting: Orthopedic Surgery

## 2022-09-27 VITALS — Ht 64.0 in | Wt 175.0 lb

## 2022-09-27 DIAGNOSIS — M810 Age-related osteoporosis without current pathological fracture: Secondary | ICD-10-CM

## 2022-09-27 DIAGNOSIS — Z1231 Encounter for screening mammogram for malignant neoplasm of breast: Secondary | ICD-10-CM

## 2022-09-27 DIAGNOSIS — Z0000 Encounter for general adult medical examination without abnormal findings: Secondary | ICD-10-CM

## 2022-09-27 NOTE — Patient Instructions (Signed)
  Julie Park , Thank you for taking time to come for your Medicare Wellness Visit. I appreciate your ongoing commitment to your health goals. Please review the following plan we discussed and let me know if I can assist you in the future.   These are the goals we discussed:  Goals      Maintain Mobility and Function     Evidence-based guidance:  Acknowledge and validate impact of pain, loss of strength and potential disfigurement (hand osteoarthritis) on mental health and daily life, such as social isolation, anxiety, depression, impaired sexual relationship and   injury from falls.  Anticipate referral to physical or occupational therapy for assessment, therapeutic exercise and recommendation for adaptive equipment or assistive devices; encourage participation.  Assess impact on ability to perform activities of daily living, as well as engage in sports and leisure events or requirements of work or school.  Provide anticipatory guidance and reassurance about the benefit of exercise to maintain function; acknowledge and normalize fear that exercise may worsen symptoms.  Encourage regular exercise, at least 10 minutes at a time for 45 minutes per week; consider yoga, water exercise and proprioceptive exercises; encourage use of wearable activity tracker to increase motivation and adherence.  Encourage maintenance or resumption of daily activities, including employment, as pain allows and with minimal exposure to trauma.  Assist patient to advocate for adaptations to the work environment.  Consider level of pain and function, gender, age, lifestyle, patient preference, quality of life, readiness and ?ocapacity to benefit? when recommending patients for orthopaedic surgery consultation.  Explore strategies, such as changes to medication regimen or activity that enables patient to anticipate and manage flare-ups that increase deconditioning and disability.  Explore patient preferences; encourage  exposure to a broader range of activities that have been avoided for fear of experiencing pain.  Identify barriers to participation in therapy or exercise, such as pain with activity, anticipated or imagined pain.  Monitor postoperative joint replacement or any preexisting joint replacement for ongoing pain and loss of function; provide social support and encouragement throughout recovery.   Notes:         This is a list of the screening recommended for you and due dates:  Health Maintenance  Topic Date Due   Mammogram  10/19/2022   Flu Shot  01/24/2023   Medicare Annual Wellness Visit  09/27/2023   DTaP/Tdap/Td vaccine (3 - Td or Tdap) 12/19/2028   Pneumonia Vaccine  Completed   DEXA scan (bone density measurement)  Completed   HPV Vaccine  Aged Out   COVID-19 Vaccine  Discontinued   Zoster (Shingles) Vaccine  Discontinued

## 2022-09-27 NOTE — Progress Notes (Signed)
   This service is provided via telemedicine  No vital signs collected/recorded due to the encounter was a telemedicine visit.   Location of patient (ex: home, work):  Home  Patient consents to a telephone visit:  Yes  Location of the provider (ex: office, home):  [employer].  Name of any referring provider:  Yvonna Alanis, NP   Names of all persons participating in the telemedicine service and their role in the encounter:  Patient, Daughter Abelina Bachelor, Heriberto [place], [place], Windell Moulding NP.    Time spent on call:  8 minutes spent on the phone with Medical Assistant.

## 2022-09-27 NOTE — Progress Notes (Signed)
Subjective:   Julie Park is a 89 y.o. female who presents for Medicare Annual (Subsequent) preventive examination.  Review of Systems     Cardiac Risk Factors include: advanced age (>68men, >5 women);hypertension;sedentary lifestyle;obesity (BMI >30kg/m2)     Objective:    Today's Vitals   09/27/22 1122  Weight: 175 lb (79.4 kg)  Height: 5\' 4"  (1.626 m)   Body mass index is 30.04 kg/m.     09/27/2022   11:17 AM 05/25/2022    2:51 PM 12/27/2021    1:11 PM 10/04/2021    4:54 PM 12/30/2020    3:44 PM 11/10/2020    3:07 PM 09/26/2020    2:22 PM  Advanced Directives  Does Patient Have a Medical Advance Directive? Yes Yes Yes Yes Yes Yes Yes  Type of Advance Directive Living will Living will;Healthcare Power of Attorney;Out of facility DNR (pink MOST or yellow form) [place];Living will Living will [place];Living will [place];Living will [place];Living will  Does patient want to make changes to medical advance directive? No - Patient declined No - Patient declined No - Patient declined No - Patient declined No - Patient declined No - Patient declined No - Patient declined  Copy of [place] in Chart?  No - copy requested Yes - validated most recent copy scanned in chart (See row information)  Yes - validated most recent copy scanned in chart (See row information) Yes - validated most recent copy scanned in chart (See row information) Yes - validated most recent copy scanned in chart (See row information)    Current Medications (verified) Outpatient Encounter Medications as of 09/27/2022  Medication Sig   acetaminophen (TYLENOL) 325 MG tablet Take 2 tablets (650 mg total) by mouth every 6 (six) hours as needed.   aspirin 81 MG tablet Take 81 mg by mouth every morning.    atorvastatin (LIPITOR) 10 MG tablet TAKE ONE TABLET BY MOUTH ONCE DAILY   Fiber POWD Take by mouth.   fluticasone  (FLONASE) 50 MCG/ACT nasal spray SPRAY 2 SPRAYS INTO EACH NOSTRIL EVERY DAY   gabapentin (NEURONTIN) 300 MG capsule Take 1 capsule (300 mg total) by mouth 3 (three) times daily.   levothyroxine (SYNTHROID) 100 MCG tablet TAKE 1 TABLET BY MOUTH EVERY DAY   loratadine (CLARITIN) 10 MG tablet Take 1 tablet (10 mg total) by mouth daily.   LORazepam (ATIVAN) 1 MG tablet Take 1 tablet (1 mg total) by mouth every 8 (eight) hours as needed. for anxiety   Magnesium 250 MG TABS Take 2 tablets by mouth daily.    metoprolol tartrate (LOPRESSOR) 25 MG tablet Take 1 tablet (25 mg total) by mouth 2 (two) times daily.   nitroGLYCERIN (NITROSTAT) 0.4 MG SL tablet Place 1 tablet (0.4 mg total) under the tongue every 5 (five) minutes x 3 doses as needed for chest pain (if no relief after 3rd dose, proceed to ED or call 911).   nystatin cream (MYCOSTATIN) Apply 1 Application topically as needed for dry skin.   omeprazole (PRILOSEC) 40 MG capsule TAKE ONE CAPSULE BY MOUTH ONCE DAILY   Propylene Glycol (SYSTANE BALANCE) 0.6 % SOLN Apply to eye daily as needed.   sodium chloride (OCEAN) 0.65 % nasal spray Place 1 spray into the nose as needed for congestion.   traMADol (ULTRAM) 50 MG tablet TAKE 1/2 TABLET BY MOUTH DAILY   Facility-Administered Encounter Medications as of 09/27/2022  Medication  Romosozumab-aqqg (EVENITY) 105 MG/1.17ML injection 210 mg    Allergies (verified) Patient has no known allergies.   History: Past Medical History:  Diagnosis Date   Anxiety    Chest pain 02/08/15   ETT normal   Diverticulosis    Diverticulitis (1982)   Fracture of rib of right side 08/16/2013   GERD (gastroesophageal reflux disease)    Hyperlipidemia, mixed    Hypothyroidism    IBS (irritable bowel syndrome)    Morton's neuroma    Right lumbar radiculopathy    Intermittent x 2 episodes in summer 2014   Shingles 1967 and 2010   Ulcer    Urine incontinence    Vertigo    Past Surgical History:  Procedure  Laterality Date   CARDIOVASCULAR STRESS TEST  02/08/15   ETT normal   CATARACT EXTRACTION  2012   Dr. Herbert Deaner   COLONOSCOPY  2005   Dr. Arsenio Loader at [facility] (normal per pt report)   DEXA  11/03/13   Heel T score -0.8.   [place]   right foot   TONSILLECTOMY     TONSILLECTOMY AND ADENOIDECTOMY  1948   Dr. Taylor   WISDOM TOOTH EXTRACTION     Family History  Problem Relation Age of Onset   Tuberculosis Mother    Pancreatic cancer Father    Asthma Daughter    Heart attack Paternal Grandfather    Social History   Socioeconomic History   Marital status: Single    Spouse name: Not on file   Number of children: Not on file   Years of education: Not on file   Highest education level: Not on file  Occupational History   Occupation: Retired  Tobacco Use   Smoking status: Former    Packs/day: 1.00    Years: 8.00    Additional pack years: 0.00    Total pack years: 8.00    Types: Cigarettes    Start date: 06/26/1983   Smokeless tobacco: Never  Vaping Use   Vaping Use: Never used  Substance and Sexual Activity   Alcohol use: No   Drug use: No   Sexual activity: Not Currently  Other Topics Concern   Not on file  Social History Narrative   Divorced, 2 children.   Lives in [place].   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   Tobacco: small amount, distant past.  Alcohol: none.           Social History      Diet? n/a      Do you drink/eat things with caffeine? coffee      Marital status?     Divorced (1984)                              What year were you married? 1958      Do you live in a house, apartment, assisted living, condo, trailer, etc.? house      Is it one or more stories?  yes      How many persons live in your home? Living with daughter and husband at the present      Do you have any pets in your home? (please list) no        Highest level of education completed? 16 years  Current or past profession: teacher grades 1 and 2, education coordinator [employer]       Do you exercise?                   yes                   Type & how often? Walk 1-3 times per week      Advanced Directives      Do you have a living will? yes      Do you have a DNR form?                                  If not, do you want to discuss one?      Do you have signed POA/HPOA for forms? yes      Functional Status      Do you have difficulty bathing or dressing yourself?      Do you have difficulty preparing food or eating?       Do you have difficulty managing your medications?      Do you have difficulty managing your finances?      Do you have difficulty affording your medications?      Social Determinants of Health   Financial Resource Strain: Low Risk  (09/27/2022)   Overall Financial Resource Strain (CARDIA)    Difficulty of Paying Living Expenses: Not hard at all  Food Insecurity: No Food Insecurity (09/27/2022)   Hunger Vital Sign    Worried About Running Out of Food in the Last Year: Never true    Ran Out of Food in the Last Year: Never true  Transportation Needs: No Transportation Needs (09/27/2022)   PRAPARE - Hydrologist (Medical): No    Lack of Transportation (Non-Medical): No  Physical Activity: Inactive (09/27/2022)   Exercise Vital Sign    Days of Exercise per Week: 0 days    Minutes of Exercise per Session: 0 min  Stress: No Stress Concern Present (09/27/2022)   [place]    Feeling of Stress : Only a little  Social Connections: Socially Isolated (09/27/2022)   Social Connection and Isolation Panel [NHANES]    Frequency of Communication with Friends and Family: Three times a week    Frequency of Social Gatherings with Friends and Family: More than three times a week    Attends Religious Services: Never     Printmaker: Not on file    Attends Archivist Meetings: Never    Marital Status: Divorced    Tobacco Counseling Counseling given: Not Answered   Clinical Intake:  Pre-visit preparation completed: Yes  Pain : No/denies pain     BMI - recorded: 30.04 Nutritional Status: BMI > 30  Obese Nutritional Risks: None Diabetes: No  How often do you need to have someone help you when you read instructions, pamphlets, or other written materials from your doctor or pharmacy?: 2 - Rarely What is the last grade level you completed in school?: College  Diabetic?No  Interpreter Needed?: No      Activities of Daily Living    09/27/2022   11:31 AM  In your present state of health, do you have any difficulty performing the following activities:  Hearing? 0  Vision? 0  Difficulty concentrating or making decisions? 0  Walking or climbing stairs? 0  Doing errands, shopping? 0  Preparing Food and eating ? N  Using the Toilet? N  In the past six months, have you accidently leaked urine? Y  Do you have problems with loss of bowel control? N  Managing your Medications? N  Managing your Finances? N  Housekeeping or managing your Housekeeping? N    Patient Care Team: Yvonna Alanis, NP as PCP - General (Adult Health Nurse Practitioner) Chalmers Guest, MD as PCP - Cardiology (Cardiology) Monna Fam, MD as Consulting Physician (Ophthalmology) Gerarda Fraction, MD as Referring Physician (Ophthalmology) Carole Civil, MD as Consulting Physician (Orthopedic Surgery)  Indicate any recent Medical Services you may have received from other than [facility] providers in the past year (date may be approximate).     Assessment:   This is a routine wellness examination for Yani.  Hearing/Vision screen Hearing Screening - Comments:: No hearings concerns. Patient doesn't wear hearing aids.  Vision Screening - Comments:: No vision concerns. Patient last  eye exam within past year. Patient wears prescription glasses.   Dietary issues and exercise activities discussed: Current Exercise Habits: The patient does not participate in regular exercise at present, Exercise limited by: cardiac condition(s);orthopedic condition(s)   Goals Addressed             This Visit's Progress    Maintain Mobility and Function   On track    Evidence-based guidance:  Acknowledge and validate impact of pain, loss of strength and potential disfigurement (hand osteoarthritis) on mental health and daily life, such as social isolation, anxiety, depression, impaired sexual relationship and   injury from falls.  Anticipate referral to physical or occupational therapy for assessment, therapeutic exercise and recommendation for adaptive equipment or assistive devices; encourage participation.  Assess impact on ability to perform activities of daily living, as well as engage in sports and leisure events or requirements of work or school.  Provide anticipatory guidance and reassurance about the benefit of exercise to maintain function; acknowledge and normalize fear that exercise may worsen symptoms.  Encourage regular exercise, at least 10 minutes at a time for 45 minutes per week; consider yoga, water exercise and proprioceptive exercises; encourage use of wearable activity tracker to increase motivation and adherence.  Encourage maintenance or resumption of daily activities, including employment, as pain allows and with minimal exposure to trauma.  Assist patient to advocate for adaptations to the work environment.  Consider level of pain and function, gender, age, lifestyle, patient preference, quality of life, readiness and ?ocapacity to benefit? when recommending patients for orthopaedic surgery consultation.  Explore strategies, such as changes to medication regimen or activity that enables patient to anticipate and manage flare-ups that increase deconditioning and  disability.  Explore patient preferences; encourage exposure to a broader range of activities that have been avoided for fear of experiencing pain.  Identify barriers to participation in therapy or exercise, such as pain with activity, anticipated or imagined pain.  Monitor postoperative joint replacement or any preexisting joint replacement for ongoing pain and loss of function; provide social support and encouragement throughout recovery.   Notes:        Depression Screen    09/27/2022   11:13 AM 07/19/2022   10:00 AM 10/19/2020    2:10 PM 04/07/2020   10:34 AM 12/01/2019    1:17 PM 10/29/2019    2:20 PM 08/24/2019    1:39 PM  PHQ 2/9 Scores  PHQ - 2 Score 0 0 0 0  0 0 0    Fall Risk    09/27/2022   11:30 AM 09/27/2022   11:13 AM 07/19/2022    9:59 AM 05/25/2022    2:50 PM 12/28/2021   11:04 AM  Fall Risk   Falls in the past year? 0 0 0 0 0  Number falls in past yr: 0 0 0 0 0  Injury with Fall? 0 0 0 0 0  Risk for fall due to : History of fall(s);Impaired balance/gait;Impaired mobility No Fall Risks No Fall Risks No Fall Risks No Fall Risks  Follow up Falls evaluation completed;Education provided;Falls prevention discussed Falls evaluation completed Falls evaluation completed Falls evaluation completed Falls evaluation completed    FALL RISK PREVENTION PERTAINING TO THE HOME:  Any stairs in or around the home? Yes  If so, are there any without handrails? Yes  Home free of loose throw rugs in walkways, pet beds, electrical cords, etc? Yes  Adequate lighting in your home to reduce risk of falls? Yes   ASSISTIVE DEVICES UTILIZED TO PREVENT FALLS:  Life alert? Yes  Use of a cane, walker or w/c? Yes  Grab bars in the bathroom? Yes  Shower chair or bench in shower? Yes  Elevated toilet seat or a handicapped toilet? Yes   TIMED UP AND GO:  Was the test performed? No .  Length of time to ambulate 10 feet: N/A sec.   Gait slow and steady with assistive device  Cognitive  Function:    07/19/2022   10:01 AM  MMSE - Mini Mental State Exam  Orientation to time 5  Orientation to Place 5  Registration 3  Attention/ Calculation 5  Recall 3  Language- name 2 objects 2  Language- repeat 1  Language- follow 3 step command 3  Language- read & follow direction 1  Write a sentence 1  Copy design 1  Total score 30        09/27/2022   11:14 AM  6CIT Screen  What Year? 0 points  What month? 0 points  What time? 0 points  Count back from 20 0 points  Months in reverse 0 points  Repeat phrase 0 points  Total Score 0 points    Immunizations Immunization History  Administered Date(s) Administered   Fluad Quad(high Dose 65+) 04/13/2021   Influenza Split 02/24/2012   Influenza, High Dose Seasonal PF 02/23/2013, 04/29/2018, 04/24/2019, 04/11/2022   Influenza,inj,Quad PF,6+ Mos 02/23/2013   Influenza-Unspecified 02/24/2012, 03/08/2015, 03/26/2017, 05/14/2020   PFIZER(Purple Top)SARS-COV-2 Vaccination 07/15/2019, 08/14/2019, 04/14/2020   Pfizer Covid-19 Vaccine Bivalent Booster 66yrs & up 05/10/2021   Pneumococcal Conjugate-13 03/25/1996, 03/20/2013   Pneumococcal Polysaccharide-23 04/16/1996, 03/20/2013   Pneumococcal-Unspecified 03/20/2013   Td 12/20/2018   Tdap 09/17/2013   Zoster, Live 08/23/2012    TDAP status: Up to date  Flu Vaccine status: Up to date  Pneumococcal vaccine status: Up to date  Covid-19 vaccine status: Completed vaccines  Qualifies for Shingles Vaccine? Yes   Zostavax completed Yes   Shingrix Completed?: No.    Education has been provided regarding the importance of this vaccine. Patient has been advised to call insurance company to determine out of pocket expense if they have not yet received this vaccine. Advised may also receive vaccine at local pharmacy or Health Dept. Verbalized acceptance and understanding.  Screening Tests Health Maintenance  Topic Date Due   MAMMOGRAM  10/19/2022   INFLUENZA VACCINE  01/24/2023    Medicare Annual Wellness (AWV)  09/27/2023   DTaP/Tdap/Td (3 -  Td or Tdap) 12/19/2028   Pneumonia Vaccine 13+ Years old  Completed   DEXA SCAN  Completed   HPV VACCINES  Aged Out   COVID-19 Vaccine  Discontinued   Zoster Vaccines- Shingrix  Discontinued    Health Maintenance  There are no preventive care reminders to display for this patient.   Colorectal cancer screening: No longer required.   Mammogram status: Completed 09/2021. Repeat every year  Bone Density status: Completed 03/2019. Results reflect: Bone density results: OSTEOPOROSIS. Repeat every 2 years.  Lung Cancer Screening: (Low Dose CT Chest recommended if Age 46-80 years, 30 pack-year currently smoking OR have quit w/in 15years.) does not qualify.   Lung Cancer Screening Referral: No  Additional Screening:   Hepatitis C Screening: does not qualify; Completed   Vision Screening: Recommended annual ophthalmology exams for early detection of glaucoma and other disorders of the eye. Is the patient up to date with their annual eye exam?  Yes  Who is the provider or what is the name of the office in which the patient attends annual eye exams? Dr. Cordelia Pen If pt is not established with a provider, would they like to be referred to a provider to establish care? No .   Dental Screening: Recommended annual dental exams for proper oral hygiene  Community Resource Referral / Chronic Care Management: CRR required this visit?  No   CCM required this visit?  No      Plan:     I have personally reviewed and noted the following in the patient's chart:   Medical and social history Use of alcohol, tobacco or illicit drugs  Current medications and supplements including opioid prescriptions. Patient is currently taking opioid prescriptions. Information provided to patient regarding non-opioid alternatives. Patient advised to discuss non-opioid treatment plan with their provider. Functional ability and status Nutritional  status Physical activity Advanced directives List of other physicians Hospitalizations, surgeries, and ER visits in previous 12 months Vitals Screenings to include cognitive, depression, and falls Referrals and appointments  In addition, I have reviewed and discussed with patient certain preventive protocols, quality metrics, and best practice recommendations. A written personalized care plan for preventive services as well as general preventive health recommendations were provided to patient.   Virtual Visit   I connected with Glean Hess by telephone and verified that I am speaking with the correct person using two identifiers.  Location: [place] Patient: Glean Hess Provider: Yvonna Alanis, NP    I discussed the limitations, risks, security and privacy concerns of performing an evaluation and management service by telephone and the availability of in person appointments. I also discussed with the patient that there may be a patient responsible charge related to this service. The patient expressed understanding and agreed to proceed.   I discussed the assessment and treatment plan with the patient. The patient was provided an opportunity to ask questions and all were answered. The patient agreed with the plan and demonstrated an understanding of the instructions.   The patient was advised to call back or seek an in-person evaluation if the symptoms worsen or if the condition fails to improve as anticipated.  I provided 21 minutes of face-to-face time during this encounter.  Windell Moulding, [place] printed and mailed    Yvonna Alanis, NP   09/27/2022   Nurse Notes: mammogram and bone density ordered

## 2022-09-29 ENCOUNTER — Other Ambulatory Visit: Payer: Self-pay | Admitting: Orthopedic Surgery

## 2022-09-29 DIAGNOSIS — G8929 Other chronic pain: Secondary | ICD-10-CM

## 2022-10-01 NOTE — Telephone Encounter (Signed)
Patient has up to date Non Opioid Contract on file. Medication pend and sent to PCP Octavia Heir, NP

## 2022-10-02 ENCOUNTER — Ambulatory Visit: Payer: Medicare HMO | Admitting: Internal Medicine

## 2022-10-05 ENCOUNTER — Ambulatory Visit: Payer: Medicare HMO | Attending: Internal Medicine | Admitting: Internal Medicine

## 2022-10-05 ENCOUNTER — Encounter: Payer: Self-pay | Admitting: Internal Medicine

## 2022-10-05 VITALS — BP 116/60 | HR 66 | Ht 64.0 in | Wt 173.0 lb

## 2022-10-05 DIAGNOSIS — E7849 Other hyperlipidemia: Secondary | ICD-10-CM | POA: Diagnosis not present

## 2022-10-05 DIAGNOSIS — R079 Chest pain, unspecified: Secondary | ICD-10-CM

## 2022-10-05 NOTE — Progress Notes (Signed)
Cardiology Office Note  Date: 10/05/2022   ID: Julie Park, DOB November 29, 1933, MRN [id number]  PCP:  Julie Heir, NP  Cardiologist:  Marjo Bicker, MD Electrophysiologist:  None   Reason for Office Visit: Chest pain follow-up   History of Present Illness: Julie Park is a 89 y.o. female known to have hypothyroidism, HLD was referred to cardiology clinic for evaluation of chest pain.  Patient was initially referred to cardiology clinic in 07/2022 for chest tightness. Patient reported having substernal chest tightness radiating to left neck and left jaw x 2 months, frequency once per week, lasts for 15 minutes, occurs with exertion (while washing dishes) and resolves with rest. Associated with SOB. NM stress test from 3/24 showed no evidence of ischemia. Echocardiogram from 08/2022 showed normal LVEF and no valve abnormalities.  ABI is also within normal limits. She is here for follow-up visit, accompanied by daughter. Chest tightness, resolved after starting metoprolol tartrate 25 mg twice daily in the last clinic visit.  Denies symptoms of DOE, dizziness, syncope, leg swelling and palpitations. She does climb stairs at least once a day. She lives in her daughter's house and goes to her place twice a month.  She underwent stress test many many years ago, especially in the early 2000's and was told it was normal.  She did not undergo LHC in the past.  Past Medical History:  Diagnosis Date   Anxiety    Chest pain 02/08/15   ETT normal   Diverticulosis    Diverticulitis (1982)   Fracture of rib of right side 08/16/2013   GERD (gastroesophageal reflux disease)    Hyperlipidemia, mixed    Hypothyroidism    IBS (irritable bowel syndrome)    Morton's neuroma    Right lumbar radiculopathy    Intermittent x 2 episodes in summer 2014   Shingles 1967 and 2010   Ulcer    Urine incontinence    Vertigo     Past Surgical History:  Procedure Laterality Date   CARDIOVASCULAR  STRESS TEST  02/08/15   ETT normal   CATARACT EXTRACTION  2012   Dr. Elmer Picker   COLONOSCOPY  2005   Dr. Gwinda Passe at [facility] (normal per pt report)   DEXA  11/03/13   Heel T score -0.8.   FOOT NEUROMA SURGERY  1994   right foot   TONSILLECTOMY     TONSILLECTOMY AND ADENOIDECTOMY  1948   Dr. Linde Gillis [facility]   TUBAL LIGATION  [phone]   WISDOM TOOTH EXTRACTION      Current Outpatient Medications  Medication Sig Dispense Refill   acetaminophen (TYLENOL) 325 MG tablet Take 2 tablets (650 mg total) by mouth every 6 (six) hours as needed. 30 tablet 0   aspirin 81 MG tablet Take 81 mg by mouth every morning.      atorvastatin (LIPITOR) 10 MG tablet TAKE ONE TABLET BY MOUTH ONCE DAILY 90 tablet 1   Fiber POWD Take by mouth.     fluticasone (FLONASE) 50 MCG/ACT nasal spray SPRAY 2 SPRAYS INTO EACH NOSTRIL EVERY DAY 48 mL 2   gabapentin (NEURONTIN) 300 MG capsule Take 1 capsule (300 mg total) by mouth 3 (three) times daily. 90 capsule 11   levothyroxine (SYNTHROID) 100 MCG tablet TAKE 1 TABLET BY MOUTH EVERY DAY 90 tablet 3   loratadine (CLARITIN) 10 MG tablet Take 1 tablet (10 mg total) by mouth daily. 30 tablet 11   LORazepam (ATIVAN) 1 MG tablet  Take 1 tablet (1 mg total) by mouth every 8 (eight) hours as needed. for anxiety 4 tablet 0   Magnesium 250 MG TABS Take 2 tablets by mouth daily.      metoprolol tartrate (LOPRESSOR) 25 MG tablet Take 1 tablet (25 mg total) by mouth 2 (two) times daily. 180 tablet 1   nitroGLYCERIN (NITROSTAT) 0.4 MG SL tablet Place 1 tablet (0.4 mg total) under the tongue every 5 (five) minutes x 3 doses as needed for chest pain (if no relief after 3rd dose, proceed to ED or call 911). 25 tablet 3   nystatin cream (MYCOSTATIN) Apply 1 Application topically as needed for dry skin. 30 g 3   omeprazole (PRILOSEC) 40 MG capsule TAKE ONE CAPSULE BY MOUTH ONCE DAILY 90 capsule 3   Propylene Glycol (SYSTANE BALANCE) 0.6 % SOLN Apply to eye daily as needed.     sodium  chloride (OCEAN) 0.65 % nasal spray Place 1 spray into the nose as needed for congestion. 30 mL 12   traMADol (ULTRAM) 50 MG tablet TAKE 1/2 TABLET BY MOUTH DAILY 15 tablet 0   Current Facility-Administered Medications  Medication Dose Route Frequency Provider Last Rate Last Admin   Romosozumab-aqqg (EVENITY) 105 MG/1. injection 210 mg  210 mg Subcutaneous Once Reed, Tiffany L, DO       Allergies:  Patient has no known allergies.   Social History: The patient  reports that she has quit smoking. Her smoking use included cigarettes. She started smoking about 39 years ago. She has a 8.00 pack-year smoking history. She has never used smokeless tobacco. She reports that she does not drink alcohol and does not use drugs.   Family History: The patient's family history includes Asthma in her daughter; Heart attack in her paternal grandfather; Pancreatic cancer in her father; Tuberculosis in her mother.   ROS:  Please see the history of present illness. Otherwise, complete review of systems is positive for none.  All other systems are reviewed and negative.   Physical Exam: VS:  BP 116/60   Pulse 66   Ht  (1.626 m)   Wt 173 lb (78.5 kg)   SpO2 97%   BMI 29.70 kg/m , BMI Body mass index is 29.7 kg/m.  Wt Readings from Last 3 Encounters:  10/05/22 173 lb (78.5 kg)  09/27/22 175 lb (79.4 kg)  08/20/22 175 lb 3.2 oz (79.5 kg)    General: Patient appears comfortable at rest. HEENT: Conjunctiva and lids normal, oropharynx clear with moist mucosa. Neck: Supple, no elevated JVP or carotid bruits, no thyromegaly. Lungs: Clear to auscultation, nonlabored breathing at rest. Cardiac: Regular rate and rhythm, no S3 or significant systolic murmur, no pericardial rub. Abdomen: Soft, nontender, no hepatomegaly, bowel sounds present, no guarding or rebound. Extremities: No pitting edema, distal pulses 2+. Skin: Warm and dry. Musculoskeletal: No kyphosis. Neuropsychiatric: Alert and oriented x3,  affect grossly appropriate.  ECG:  An ECG dated 08/20/2022 was personally reviewed today and demonstrated:  Sinus tachycardia, HR 103 bpm and no ST changes  Recent Labwork: 10/05/2021: Brain Natriuretic Peptide 43 05/25/2022: ALT 12; AST 17; BUN 18; Creat 0.85; Hemoglobin 13.4; Platelets 257; Potassium 4.9; Sodium 136     Component Value Date/Time   CHOL 152 06/28/2021 0841   TRIG 98 06/28/2021 0841   HDL 59 06/28/2021 0841   CHOLHDL 2.6 06/28/2021 0841   VLDL 25.6 11/04/2014 0946   LDLCALC 75 06/28/2021 0841    Other Studies Reviewed Today: NM stress  test in 08/2022 No evidence of ischemia  Echocardiogram in 08/2022 Normal LVEF No valve abnormalities  Vascular ABI in 08/2022 No evidence of poor circulation in lower extremities  Assessment and Plan: Patient is a 89 year old F known to have hypothyroidism, HLD was referred to cardiology clinic for evaluation of chest pain.  # Cardiac chest pain likely secondary to coronary microvascular disease: NM stress test in 08/2022 showed no evidence of ischemia. Echocardiogram in 08/2022 showed normal LVEF and no valve abnormalities. Patient symptoms of chest pressure completely resolved with metoprolol tartrate 25 mg twice daily. Continue the same medication for now. # Bilateral lower extremity pain and numbness: PAD is ruled out # HLD: Continue atorvastatin 10 mg nightly, goal LDL less than 100.  I have spent a total of 30 minutes with patient reviewing chart, EKGs, labs and examining patient as well as establishing an assessment and plan that was discussed with the patient.  > 50% of time was spent in direct patient care.     Medication Adjustments/Labs and Tests Ordered: Current medicines are reviewed at length with the patient today.  Concerns regarding medicines are outlined above.   Tests Ordered: No orders of the defined types were placed in this encounter.   Medication Changes: No orders of the defined types were placed in this  encounter.   Disposition:  Follow up  1 year  Signed Matthew Cina Verne Spurr, MD, 10/05/2022 9:08 AM    [facility] [address] [place], [place], [place] [address]

## 2022-10-05 NOTE — Patient Instructions (Signed)
Medication Instructions:  Your physician recommends that you continue on your current medications as directed. Please refer to the Current Medication list given to you today.  *If you need a refill on your cardiac medications before your next appointment, please call your pharmacy*   Lab Work: NONE   If you have labs (blood work) drawn today and your tests are completely normal, you will receive your results only by: MyChart Message (if you have MyChart) OR A paper copy in the mail If you have any lab test that is abnormal or we need to change your treatment, we will call you to review the results.   Testing/Procedures: NONE    Follow-Up: At [facility], you and your health needs are our priority.  As part of our continuing mission to provide you with exceptional heart care, we have created designated Provider Care Teams.  These Care Teams include your primary Cardiologist (physician) and Advanced Practice Providers (APPs -  Physician Assistants and Nurse Practitioners) who all work together to provide you with the care you need, when you need it.  We recommend signing up for the patient portal called "MyChart".  Sign up information is provided on this After Visit Summary.  MyChart is used to connect with patients for Virtual Visits (Telemedicine).  Patients are able to view lab/test results, encounter notes, upcoming appointments, etc.  Non-urgent messages can be sent to your provider as well.   To learn more about what you can do with MyChart, go to [email or web address].    Your next appointment:   1 year(s)  Provider:   You may see Vishnu P Mallipeddi, MD or one of the following Advanced Practice Providers on your designated Care Team:   Brittany Strader, PA-C  Michele Lenze, PA-C     Other Instructions Thank you for choosing [facility]!    

## 2022-10-24 ENCOUNTER — Other Ambulatory Visit: Payer: Self-pay | Admitting: Orthopedic Surgery

## 2022-10-24 DIAGNOSIS — B372 Candidiasis of skin and nail: Secondary | ICD-10-CM

## 2022-10-29 ENCOUNTER — Other Ambulatory Visit: Payer: Self-pay | Admitting: Orthopedic Surgery

## 2022-10-29 ENCOUNTER — Other Ambulatory Visit: Payer: Self-pay

## 2022-10-29 DIAGNOSIS — G8929 Other chronic pain: Secondary | ICD-10-CM

## 2022-10-29 DIAGNOSIS — F419 Anxiety disorder, unspecified: Secondary | ICD-10-CM

## 2022-10-29 MED ORDER — LORAZEPAM 1 MG PO TABS: 1.0000 mg | ORAL_TABLET | Freq: Three times a day (TID) | ORAL | 0 refills | Status: DC | PRN

## 2022-10-29 MED ORDER — TRAMADOL HCL 50 MG PO TABS: 25.0000 mg | ORAL_TABLET | Freq: Every day | ORAL | 0 refills | Status: DC

## 2022-10-29 NOTE — Telephone Encounter (Signed)
Patient called requesting refills on lorazepam and tramadol. There is an up to date treatment agreement on file. Lorazepam was last refilled 08/17/22. Tramadol last refilled 10/01/22.  Medications have been pended and sent to Hazle Nordmann, NP to approve/refuse

## 2022-11-01 ENCOUNTER — Other Ambulatory Visit: Payer: Self-pay

## 2022-11-01 DIAGNOSIS — F419 Anxiety disorder, unspecified: Secondary | ICD-10-CM

## 2022-11-01 NOTE — Telephone Encounter (Signed)
Patient called stating that her lorazepam was only sent in for 4 tablets.   Updated prescription pended for 30 day supply of 90 tablets.   Sent to Hazle Nordmann, NP

## 2022-11-02 ENCOUNTER — Other Ambulatory Visit: Payer: Self-pay

## 2022-11-02 DIAGNOSIS — F419 Anxiety disorder, unspecified: Secondary | ICD-10-CM

## 2022-11-02 MED ORDER — LORAZEPAM 1 MG PO TABS: 1.0000 mg | ORAL_TABLET | Freq: Three times a day (TID) | ORAL | 0 refills | Status: DC | PRN

## 2022-11-02 NOTE — Telephone Encounter (Signed)
Patient called and states that her refill for Lorazepam was sent in with only 4 tablets. Medication resent to PCP Octavia Heir, NP for approval with missing pills.

## 2022-11-15 ENCOUNTER — Other Ambulatory Visit (HOSPITAL_COMMUNITY): Payer: Medicare HMO

## 2022-11-16 ENCOUNTER — Ambulatory Visit (HOSPITAL_COMMUNITY): Payer: Medicare HMO

## 2022-11-16 ENCOUNTER — Other Ambulatory Visit (HOSPITAL_COMMUNITY): Payer: Medicare HMO

## 2022-11-20 ENCOUNTER — Other Ambulatory Visit: Payer: Self-pay | Admitting: Neurology

## 2022-11-20 ENCOUNTER — Telehealth: Payer: Self-pay | Admitting: Neurology

## 2022-11-20 MED ORDER — GABAPENTIN 300 MG PO CAPS: 300.0000 mg | ORAL_CAPSULE | Freq: Three times a day (TID) | ORAL | 0 refills | Status: DC

## 2022-11-20 NOTE — Telephone Encounter (Signed)
Requested Prescriptions   Pending Prescriptions Disp Refills   gabapentin (NEURONTIN) 300 MG capsule [Pharmacy Med Name: GABAPENTIN 300 MG CAPSULE] 90 capsule 11    Sig: TAKE 1 CAPSULE BY MOUTH THREE TIMES A DAY   Last seen 04/25/21 pt is overdue for appt and refill will be denied

## 2022-11-20 NOTE — Telephone Encounter (Signed)
Routing to phone room to notify that refill for 90 days sent in

## 2022-11-20 NOTE — Telephone Encounter (Signed)
Pt requesting refill on gabapentin (NEURONTIN) 300 MG capsule. Should be sent to  [facility] [phone] -  Pt requesting enough medication until appointment on 9/17 with Dr. Terrace Arabia

## 2022-11-21 NOTE — Telephone Encounter (Signed)
Called pt. Informed her 90 day supply was sent to pharmacy.

## 2022-11-26 ENCOUNTER — Encounter (HOSPITAL_COMMUNITY): Payer: Self-pay

## 2022-11-26 ENCOUNTER — Ambulatory Visit (HOSPITAL_COMMUNITY)
Admission: RE | Admit: 2022-11-26 | Discharge: 2022-11-26 | Disposition: A | Discharge status: Home / Self Care | Payer: Medicare HMO | Source: Ambulatory Visit | Attending: Orthopedic Surgery | Admitting: Orthopedic Surgery

## 2022-11-26 DIAGNOSIS — M810 Age-related osteoporosis without current pathological fracture: Secondary | ICD-10-CM | POA: Insufficient documentation

## 2022-11-26 DIAGNOSIS — Z1231 Encounter for screening mammogram for malignant neoplasm of breast: Secondary | ICD-10-CM | POA: Diagnosis present

## 2022-11-27 ENCOUNTER — Other Ambulatory Visit: Payer: Self-pay | Admitting: Orthopedic Surgery

## 2022-11-27 ENCOUNTER — Other Ambulatory Visit: Payer: Self-pay | Admitting: Internal Medicine

## 2022-11-27 ENCOUNTER — Other Ambulatory Visit (HOSPITAL_COMMUNITY): Payer: Self-pay | Admitting: Orthopedic Surgery

## 2022-11-27 DIAGNOSIS — R928 Other abnormal and inconclusive findings on diagnostic imaging of breast: Secondary | ICD-10-CM

## 2022-11-27 DIAGNOSIS — G8929 Other chronic pain: Secondary | ICD-10-CM

## 2022-11-28 NOTE — Telephone Encounter (Signed)
Filled on 08/20/22 180 tablets with 1 refills

## 2022-11-28 NOTE — Telephone Encounter (Signed)
Patient is requesting a refill of the following medications: Requested Prescriptions   Pending Prescriptions Disp Refills   traMADol (ULTRAM) 50 MG tablet [Pharmacy Med Name: TRAMADOL HCL 50 MG TABLET] 15 tablet 0    Sig: TAKE 1/2 TABLET BY MOUTH DAILY   atorvastatin (LIPITOR) 10 MG tablet [Pharmacy Med Name: ATORVASTATIN 10 MG TABLET] 90 tablet 1    Sig: TAKE 1 TABLET EVERY DAY    Date of last refill:10/29/22  Refill amount: 0  Treatment agreement date: 07/18/2022

## 2022-11-29 ENCOUNTER — Other Ambulatory Visit: Payer: Self-pay

## 2022-11-29 DIAGNOSIS — F419 Anxiety disorder, unspecified: Secondary | ICD-10-CM

## 2022-11-29 MED ORDER — LORAZEPAM 1 MG PO TABS: 1.0000 mg | ORAL_TABLET | Freq: Three times a day (TID) | ORAL | 0 refills | Status: DC | PRN

## 2022-11-29 NOTE — Telephone Encounter (Signed)
Patient request that I send message to PCP Octavia Heir, NP about her refill. Medication pend and sent to PCP Octavia Heir, NP

## 2022-12-04 ENCOUNTER — Encounter (HOSPITAL_COMMUNITY): Payer: Self-pay

## 2022-12-04 ENCOUNTER — Telehealth: Payer: Medicare HMO

## 2022-12-04 ENCOUNTER — Ambulatory Visit (HOSPITAL_COMMUNITY)
Admission: RE | Admit: 2022-12-04 | Discharge: 2022-12-04 | Disposition: A | Discharge status: Home / Self Care | Payer: Medicare HMO | Source: Ambulatory Visit | Attending: Orthopedic Surgery | Admitting: Orthopedic Surgery

## 2022-12-04 DIAGNOSIS — R928 Other abnormal and inconclusive findings on diagnostic imaging of breast: Secondary | ICD-10-CM

## 2022-12-04 DIAGNOSIS — F419 Anxiety disorder, unspecified: Secondary | ICD-10-CM

## 2022-12-04 MED ORDER — LORAZEPAM 1 MG PO TABS: 1.0000 mg | ORAL_TABLET | Freq: Three times a day (TID) | ORAL | 5 refills | Status: DC | PRN

## 2022-12-04 NOTE — Telephone Encounter (Signed)
Patient aware request is complete

## 2022-12-04 NOTE — Telephone Encounter (Signed)
Patient called to question rx sent in on 11/29/22 for Lorazepam. Patient states she would like rx for #90 versus 86, as she has taken this medication every 8 hours scheduled for decades (not as needed, as listed on RX).  Patient would like #90 with 5 refills [facility] [place]. Patient has not picked up rx sent in on 11/29/22

## 2022-12-04 NOTE — Telephone Encounter (Signed)
New prescription sent to pharmacy 

## 2022-12-12 ENCOUNTER — Telehealth: Payer: Self-pay | Admitting: *Deleted

## 2022-12-12 NOTE — Telephone Encounter (Signed)
[facility] Si [phone] for Prior Authorization for patient's Prolia Injections and Spoke with Burnett Harry T.   Prior Authorization was APPROVED 12/12/2022-12/12/2023  Auth#: [id number]

## 2022-12-20 ENCOUNTER — Other Ambulatory Visit: Payer: Self-pay | Admitting: Neurology

## 2022-12-26 ENCOUNTER — Other Ambulatory Visit: Payer: Self-pay | Admitting: Orthopedic Surgery

## 2022-12-26 DIAGNOSIS — G8929 Other chronic pain: Secondary | ICD-10-CM

## 2022-12-26 NOTE — Telephone Encounter (Signed)
Patient has request refill on medication Tramadol. Patient was given 15 tablets 11/28/2022. Medication pend and sent to PCP Octavia Heir, NP for approval.

## 2022-12-31 ENCOUNTER — Other Ambulatory Visit: Payer: Self-pay | Admitting: Neurology

## 2023-01-17 ENCOUNTER — Ambulatory Visit: Payer: Medicare HMO | Admitting: Orthopedic Surgery

## 2023-01-23 ENCOUNTER — Other Ambulatory Visit: Payer: Self-pay | Admitting: Orthopedic Surgery

## 2023-01-23 ENCOUNTER — Other Ambulatory Visit: Payer: Self-pay | Admitting: Neurology

## 2023-01-23 DIAGNOSIS — G8929 Other chronic pain: Secondary | ICD-10-CM

## 2023-01-23 NOTE — Telephone Encounter (Signed)
Patient has request refill on medication Tramadol. Patient medication last refilled 12/26/2022. Patient has Non Opioid Contract on file dated 07/24/2022. Patient medication pend and sent to PCP Octavia Heir, NP for approval.

## 2023-01-24 ENCOUNTER — Ambulatory Visit (INDEPENDENT_AMBULATORY_CARE_PROVIDER_SITE_OTHER): Payer: Medicare HMO | Admitting: Orthopedic Surgery

## 2023-01-24 ENCOUNTER — Encounter: Payer: Self-pay | Admitting: Orthopedic Surgery

## 2023-01-24 VITALS — BP 136/70 | HR 74 | Temp 97.5°F | Resp 16 | Ht 64.0 in | Wt 176.0 lb

## 2023-01-24 DIAGNOSIS — I10 Essential (primary) hypertension: Secondary | ICD-10-CM | POA: Diagnosis not present

## 2023-01-24 DIAGNOSIS — E7849 Other hyperlipidemia: Secondary | ICD-10-CM | POA: Diagnosis not present

## 2023-01-24 DIAGNOSIS — D692 Other nonthrombocytopenic purpura: Secondary | ICD-10-CM

## 2023-01-24 DIAGNOSIS — K219 Gastro-esophageal reflux disease without esophagitis: Secondary | ICD-10-CM

## 2023-01-24 DIAGNOSIS — R110 Nausea: Secondary | ICD-10-CM

## 2023-01-24 DIAGNOSIS — M810 Age-related osteoporosis without current pathological fracture: Secondary | ICD-10-CM | POA: Diagnosis not present

## 2023-01-24 DIAGNOSIS — M545 Low back pain, unspecified: Secondary | ICD-10-CM

## 2023-01-24 DIAGNOSIS — G8929 Other chronic pain: Secondary | ICD-10-CM

## 2023-01-24 MED ORDER — DENOSUMAB 60 MG/ML ~~LOC~~ SOSY
60.0000 mg | PREFILLED_SYRINGE | Freq: Once | SUBCUTANEOUS | Status: AC
  Administered 2023-01-24: 60 mg via SUBCUTANEOUS

## 2023-01-24 NOTE — Patient Instructions (Addendum)
Please schedule fasting lab visit  Please take levothyroxine about 30 minutes prior to morning medications  Try taking medications morning medications AFTER breakfast  Try taking omeprazole 30 minutes BEFORE dinner  PT referral made> ask about location and back speciality  Please get flu vaccine BEFORE November 1st

## 2023-01-24 NOTE — Progress Notes (Signed)
Careteam: Patient Care Team: Julie Heir, NP as PCP - General (Adult Health Nurse Practitioner) Marjo Bicker, MD as PCP - Cardiology (Cardiology) Mateo Flow, MD as Consulting Physician (Ophthalmology) Marvis Repress, MD as Referring Physician (Ophthalmology) Vickki Hearing, MD as Consulting Physician (Orthopedic Surgery)  Seen by: Hazle Nordmann, AGNP-C  PLACE OF SERVICE:  [facility]  Advanced Directive information Does Patient Have a Medical Advance Directive?: Yes, Type of Advance Directive: Living will, Does patient want to make changes to medical advance directive?: No - Patient declined  No Known Allergies  Chief Complaint  Patient presents with   Medical Management of Chronic Issues    6 month follow up.    Injection     Prolia Injection.      HPI: Patient is a 89 y.o. female seen today for medical management of chronic conditions.   She is not fasting for lab work.   Prolia injection given today. DEXA done 11/2022> t score -1.9> was -2.2.   Nausea- mostly in the morning, she is taking medications on empty stomach, remains on omeprazole  Chronic back pain- ongoing, daughter used kinesiology tape to back and pain improved, requesting PT, remains on tramadol  No recent falls or injuries.   Discussed having flu vaccine before November 1st.     Review of Systems:  Review of Systems  Constitutional: Negative.   HENT: Negative.    Eyes: Negative.   Respiratory:  Negative for cough, shortness of breath and wheezing.   Cardiovascular:  Negative for chest pain and leg swelling.  Gastrointestinal: Negative.   Genitourinary: Negative.   Musculoskeletal:  Positive for back pain. Negative for falls and joint pain.  Skin: Negative.   Neurological: Negative.   Psychiatric/Behavioral: Negative.      Past Medical History:  Diagnosis Date   Anxiety    Chest pain 02/08/15   ETT normal   Diverticulosis    Diverticulitis (1982)   Fracture of rib of  right side 08/16/2013   GERD (gastroesophageal reflux disease)    Hyperlipidemia, mixed    Hypothyroidism    IBS (irritable bowel syndrome)    Morton's neuroma    Right lumbar radiculopathy    Intermittent x 2 episodes in summer 2014   Shingles 1967 and 2010   Ulcer    Urine incontinence    Vertigo    Past Surgical History:  Procedure Laterality Date   CARDIOVASCULAR STRESS TEST  02/08/15   ETT normal   CATARACT EXTRACTION  2012   Dr. Elmer Picker   COLONOSCOPY  2005   [facility]. Gwinda Passe at [facility] (normal per pt report)   DEXA  11/03/13   Heel T score -0.8.   FOOT NEUROMA SURGERY  1994   right foot   TONSILLECTOMY     TONSILLECTOMY AND ADENOIDECTOMY  1948   Dr. Linde Gillis [facility]   TUBAL LIGATION  1974   WISDOM TOOTH EXTRACTION     Social History:   reports that she has quit smoking. Her smoking use included cigarettes. She started smoking about 39 years ago. She has a 39.6 pack-year smoking history. She has never used smokeless tobacco. She reports that she does not drink alcohol and does not use drugs.  Family History  Problem Relation Age of Onset   Tuberculosis Mother    Pancreatic cancer Father    Asthma Daughter    Heart attack Paternal Grandfather     Medications: Patient's Medications  New Prescriptions   No  medications on file  Previous Medications   ACETAMINOPHEN (TYLENOL) 325 MG TABLET    Take 2 tablets (650 mg total) by mouth every 6 (six) hours as needed.   ASPIRIN 81 MG TABLET    Take 81 mg by mouth every morning.    ATORVASTATIN (LIPITOR) 10 MG TABLET    TAKE 1 TABLET EVERY DAY   FIBER POWD    Take by mouth.   FLUTICASONE (FLONASE) 50 MCG/ACT NASAL SPRAY    SPRAY 2 SPRAYS INTO EACH NOSTRIL EVERY DAY   GABAPENTIN (NEURONTIN) 300 MG CAPSULE    TAKE 1 CAPSULE BY MOUTH THREE TIMES A DAY   LEVOTHYROXINE (SYNTHROID) 100 MCG TABLET    TAKE 1 TABLET BY MOUTH EVERY DAY   LORAZEPAM (ATIVAN) 1 MG TABLET    Take 1 tablet (1 mg total) by mouth every 8 (eight) hours as  needed. for anxiety   MAGNESIUM 250 MG TABS    Take 2 tablets by mouth daily.    METOPROLOL TARTRATE (LOPRESSOR) 25 MG TABLET    Take 1 tablet (25 mg total) by mouth 2 (two) times daily.   NITROGLYCERIN (NITROSTAT) 0.4 MG SL TABLET    Place 1 tablet (0.4 mg total) under the tongue every 5 (five) minutes x 3 doses as needed for chest pain (if no relief after 3rd dose, proceed to ED or call 911).   NYSTATIN CREAM (MYCOSTATIN)    APPLY 1 APPLICATION TOPICALLY AS NEEDED FOR DRY SKIN.   OMEPRAZOLE (PRILOSEC) 40 MG CAPSULE    TAKE ONE CAPSULE BY MOUTH ONCE DAILY   PROPYLENE GLYCOL (SYSTANE BALANCE) 0.6 % SOLN    Apply to eye daily as needed.   SODIUM CHLORIDE (OCEAN) 0.65 % NASAL SPRAY    Place 1 spray into the nose as needed for congestion.   TRAMADOL (ULTRAM) 50 MG TABLET    TAKE 1/2 TABLET BY MOUTH DAILY  Modified Medications   No medications on file  Discontinued Medications   LORATADINE (CLARITIN) 10 MG TABLET    Take 1 tablet (10 mg total) by mouth daily.    Physical Exam:  Vitals:   01/24/23 0953  BP: 136/70  Pulse: 74  Resp: 16  Temp: (!) 97.5 F (36.4 C)  SpO2: 97%  Weight: 176 lb (79.8 kg)  Height: 5\' 4"  (1.626 m)   Body mass index is 30.21 kg/m. Wt Readings from Last 3 Encounters:  01/24/23 176 lb (79.8 kg)  10/05/22 173 lb (78.5 kg)  09/27/22 175 lb (79.4 kg)    Physical Exam Vitals reviewed.  Constitutional:      General: She is not in acute distress. HENT:     Head: Normocephalic.  Eyes:     General:        Right eye: No discharge.        Left eye: No discharge.  Cardiovascular:     Rate and Rhythm: Normal rate and regular rhythm.     Pulses: Normal pulses.     Heart sounds: Normal heart sounds.  Pulmonary:     Effort: Pulmonary effort is normal. No respiratory distress.     Breath sounds: Normal breath sounds. No wheezing.  Abdominal:     General: Bowel sounds are normal.     Palpations: Abdomen is soft.  Musculoskeletal:     Cervical back: Neck  supple.     Right lower leg: No edema.     Left lower leg: No edema.  Skin:    General: Skin is  warm.     Capillary Refill: Capillary refill takes less than 2 seconds.     Comments: Non tender purple lesions to left forearm x 2, no skin breakdown, vary in size  Neurological:     General: No focal deficit present.     Mental Status: She is alert and oriented to person, place, and time.     Motor: No weakness.     Gait: Gait normal.  Psychiatric:        Mood and Affect: Mood normal.        Behavior: Behavior normal.     Labs reviewed: Basic Metabolic Panel: Recent Labs    05/25/22 1537  NA 136  K 4.9  CL 104  CO2 23  GLUCOSE 108  BUN 18  CREATININE 0.85  CALCIUM 9.2   Liver Function Tests: Recent Labs    05/25/22 1537  AST 17  ALT 12  BILITOT 0.3  PROT 7.1   No results for input(s): "LIPASE", "AMYLASE" in the last 8760 hours. No results for input(s): "AMMONIA" in the last 8760 hours. CBC: Recent Labs    05/25/22 1537  WBC 7.3  NEUTROABS 4,008  HGB 13.4  HCT 41.3  MCV 86.6  PLT 257   Lipid Panel: No results for input(s): "CHOL", "HDL", "LDLCALC", "TRIG", "CHOLHDL", "LDLDIRECT" in the last 8760 hours. TSH: No results for input(s): "TSH" in the last 8760 hours. A1C: Lab Results  Component Value Date   HGBA1C 5.6 08/15/2020     Assessment/Plan 1. Senile osteoporosis - on Prolia - DEXA 11/2022> t score -1.9> was -2.2 - cont weight bearing exercise - denosumab (PROLIA) injection 60 mg  2. Primary hypertension - controlled with metoprolol  3. Other hyperlipidemia - cont atorvastatin  4. Gastroesophageal reflux disease without esophagitis - hgb stable - cont omeprazole  5. Chronic midline low back pain without sciatica - ongoing  - followed by neurology - MRI thoracic> h/o T8 compression fracture, degenerative changes to T8-T9, T11-T12, T12-L1 - continues to have daily pain - cont Tramadol - Ambulatory referral to Physical Therapy  6.  Senile purpura (HCC) - education given to patient and family  7. Nausea - worse in AM, no vomiting - exam normal, abdomen soft - suspect from taking morning meds on empty stomach - advised to take medications with food> except levothyroxine - recommend trying omeprazole before dinner  She plans to schedule fasting lab visit.   Total time: 33 minutes. Greater than 50% of total time spent doing patient education regarding health maintenance, HTN, HLD, osteoporosis, and nausea including symptom/medication management.    Next appt: Visit date not found  Loras Grieshop Scherry Ran  [facility] [phone]

## 2023-02-06 ENCOUNTER — Ambulatory Visit (INDEPENDENT_AMBULATORY_CARE_PROVIDER_SITE_OTHER): Payer: Medicare HMO | Admitting: Sports Medicine

## 2023-02-06 ENCOUNTER — Other Ambulatory Visit: Payer: Self-pay

## 2023-02-06 ENCOUNTER — Encounter: Payer: Self-pay | Admitting: Sports Medicine

## 2023-02-06 VITALS — BP 122/78 | HR 73 | Temp 97.9°F | Ht 64.0 in | Wt 173.0 lb

## 2023-02-06 DIAGNOSIS — N3946 Mixed incontinence: Secondary | ICD-10-CM | POA: Diagnosis not present

## 2023-02-06 DIAGNOSIS — E039 Hypothyroidism, unspecified: Secondary | ICD-10-CM

## 2023-02-06 DIAGNOSIS — I10 Essential (primary) hypertension: Secondary | ICD-10-CM

## 2023-02-06 DIAGNOSIS — E782 Mixed hyperlipidemia: Secondary | ICD-10-CM

## 2023-02-06 DIAGNOSIS — R350 Frequency of micturition: Secondary | ICD-10-CM | POA: Diagnosis not present

## 2023-02-06 LAB — POCT URINALYSIS DIPSTICK
Bilirubin, UA: NEGATIVE
Blood, UA: NEGATIVE
Glucose, UA: NEGATIVE
Ketones, UA: NEGATIVE
Leukocytes, UA: NEGATIVE
Nitrite, UA: NEGATIVE
Protein, UA: NEGATIVE
Spec Grav, UA: 1.01 (ref 1.010–1.025)
Urobilinogen, UA: 0.2 E.U./dL
pH, UA: 7.5 (ref 5.0–8.0)

## 2023-02-06 MED ORDER — MIRABEGRON ER 50 MG PO TB24: 50.0000 mg | ORAL_TABLET | Freq: Every day | ORAL | 2 refills | Status: DC

## 2023-02-06 NOTE — Progress Notes (Signed)
Careteam: Patient Care Team: Octavia Heir, NP as PCP - General (Adult Health Nurse Practitioner) Marjo Bicker, MD as PCP - Cardiology (Cardiology) Mateo Flow, MD as Consulting Physician (Ophthalmology) Marvis Repress, MD as Referring Physician (Ophthalmology) Vickki Hearing, MD as Consulting Physician (Orthopedic Surgery)  PLACE OF SERVICE:  [facility]  Advanced Directive information Does Patient Have a Medical Advance Directive?: Yes, Type of Advance Directive: Healthcare Power of [place];Living will, Does patient want to make changes to medical advance directive?: No - Patient declined  No Known Allergies  Chief Complaint  Patient presents with   Acute Visit    Possible UTI, patient c/o urinary frequency, overall not feeling well, faint feeling, and temp of 98 (per patient that is high for her), and bladder not emptying completely.      HPI: Patient is a 89 y.o. female presented to clinic for increased urinary frequency. Pt has chronic urinary incontinence but reports that for the past 2-3 weeks she is having urgency and frequency. Denies lower abdominal pain, fevers, chills. Had temp of 98 yesterday says that's high for her Poor appetite for the past few days She is waking up at night to use the bathroom multiple times and taking day time naps. Vitals stable   Review of Systems:  Review of Systems  Constitutional:  Negative for chills and fever.  HENT:  Negative for congestion.   Eyes:  Negative for blurred vision.  Respiratory:  Negative for cough, sputum production and shortness of breath.   Cardiovascular:  Negative for chest pain, palpitations and claudication.  Gastrointestinal:  Positive for nausea. Negative for abdominal pain and vomiting.  Genitourinary:  Positive for frequency and urgency. Negative for dysuria and hematuria.  Musculoskeletal:  Negative for falls.  Neurological:  Negative for sensory change and focal weakness.     Past  Medical History:  Diagnosis Date   Anxiety    Chest pain 02/08/15   ETT normal   Diverticulosis    Diverticulitis (1982)   Fracture of rib of right side 08/16/2013   GERD (gastroesophageal reflux disease)    Hyperlipidemia, mixed    Hypothyroidism    IBS (irritable bowel syndrome)    Morton's neuroma    Right lumbar radiculopathy    Intermittent x 2 episodes in summer 2014   Shingles 1967 and 2010   Ulcer    Urine incontinence    Vertigo    Past Surgical History:  Procedure Laterality Date   CARDIOVASCULAR STRESS TEST  02/08/15   ETT normal   CATARACT EXTRACTION  2012   Dr. Elmer Picker   COLONOSCOPY  2005   [facility]. Gwinda [facility]se at [facility] (normal per pt report)   DEXA  11/03/13   Heel T score -0.8.   FOOT NEUROMA SURGERY  1994   right foot   TONSILLECTOMY     TONSILLECTOMY AND ADENOIDECTOMY  1948   Dr. Linde Gillis [facility]   TUBAL LIGATION  1974   WISDOM TOOTH EXTRACTION     Social History:   reports that she has quit smoking. Her smoking use included cigarettes. She started smoking about 39 years ago. She has a 39.6 pack-year smoking history. She has never used smokeless tobacco. She reports that she does not drink alcohol and does not use drugs.  Family History  Problem Relation Age of Onset   Tuberculosis Mother    Pancreatic cancer Father    Asthma Daughter    Heart attack Paternal Grandfather  Medications: Patient's Medications  New Prescriptions   No medications on file  Previous Medications   ACETAMINOPHEN (TYLENOL) 325 MG TABLET    Take 2 tablets (650 mg total) by mouth every 6 (six) hours as needed.   ASPIRIN 81 MG TABLET    Take 81 mg by mouth every morning.    ATORVASTATIN (LIPITOR) 10 MG TABLET    TAKE 1 TABLET EVERY DAY   FIBER POWD    Take by mouth.   FLUTICASONE (FLONASE) 50 MCG/ACT NASAL SPRAY    SPRAY 2 SPRAYS INTO EACH NOSTRIL EVERY DAY   GABAPENTIN (NEURONTIN) 300 MG CAPSULE    TAKE 1 CAPSULE BY MOUTH THREE TIMES A DAY   LEVOTHYROXINE (SYNTHROID)  100 MCG TABLET    TAKE 1 TABLET BY MOUTH EVERY DAY   LORAZEPAM (ATIVAN) 1 MG TABLET    Take 1 tablet (1 mg total) by mouth every 8 (eight) hours as needed. for anxiety   MAGNESIUM 250 MG TABS    Take 2 tablets by mouth daily.    METOPROLOL TARTRATE (LOPRESSOR) 25 MG TABLET    Take 1 tablet (25 mg total) by mouth 2 (two) times daily.   NITROGLYCERIN (NITROSTAT) 0.4 MG SL TABLET    Place 1 tablet (0.4 mg total) under the tongue every 5 (five) minutes x 3 doses as needed for chest pain (if no relief after 3rd dose, proceed to ED or call 911).   NYSTATIN CREAM (MYCOSTATIN)    APPLY 1 APPLICATION TOPICALLY AS NEEDED FOR DRY SKIN.   OMEPRAZOLE (PRILOSEC) 40 MG CAPSULE    TAKE ONE CAPSULE BY MOUTH ONCE DAILY   PROPYLENE GLYCOL (SYSTANE BALANCE) 0.6 % SOLN    Apply to eye daily as needed.   SODIUM CHLORIDE (OCEAN) 0.65 % NASAL SPRAY    Place 1 spray into the nose as needed for congestion.   TRAMADOL (ULTRAM) 50 MG TABLET    TAKE 1/2 TABLET BY MOUTH DAILY  Modified Medications   No medications on file  Discontinued Medications   No medications on file    Physical Exam:  Vitals:   02/06/23 0853  Pulse: 73  Temp: 97.9 F (36.6 C)  TempSrc: Temporal  SpO2: 98%  Weight: 173 lb (78.5 kg)  Height: 5\' 4"  (1.626 m)   Body mass index is 29.7 kg/m. Wt Readings from Last 3 Encounters:  02/06/23 173 lb (78.5 kg)  01/24/23 176 lb (79.8 kg)  10/05/22 173 lb (78.5 kg)    Physical Exam Constitutional:      Appearance: Normal appearance.  Cardiovascular:     Rate and Rhythm: Normal rate and regular rhythm.  Pulmonary:     Effort: No respiratory distress.     Breath sounds: No wheezing or rales.  Abdominal:     General: There is no distension.     Tenderness: There is no abdominal tenderness. There is no guarding.  Musculoskeletal:        General: No swelling.  Neurological:     Mental Status: She is alert.     Sensory: No sensory deficit.     Motor: No weakness.      Labs  reviewed: Basic Metabolic Panel: Recent Labs    05/25/22 1537  NA 136  K 4.9  CL 104  CO2 23  GLUCOSE 108  BUN 18  CREATININE 0.85  CALCIUM 9.2   Liver Function Tests: Recent Labs    05/25/22 1537  AST 17  ALT 12  BILITOT 0.3  PROT  7.1   No results for input(s): "LIPASE", "AMYLASE" in the last 8760 hours. No results for input(s): "AMMONIA" in the last 8760 hours. CBC: Recent Labs    05/25/22 1537  WBC 7.3  NEUTROABS 4,008  HGB 13.4  HCT 41.3  MCV 86.6  PLT 257   Lipid Panel: No results for input(s): "CHOL", "HDL", "LDLCALC", "TRIG", "CHOLHDL", "LDLDIRECT" in the last 8760 hours. TSH: No results for input(s): "TSH" in the last 8760 hours. A1C: Lab Results  Component Value Date   HGBA1C 5.6 08/15/2020     Assessment/Plan  Urinary frequency  Chronic urinary incontinence  Urine dipstick neg Will order complete urinalysis Will refer to urology for bladder scan  Will start myrbetriq Instructed patient to limit caffeine intake  Instructed patient to avoid OTC like benadryl as it can cause urinary retention  Pt is having labs checked which were ordered by her PCP    I spent greater than 30 minutes for the care of this patient in face to face time, chart review, clinical documentation, patient education.

## 2023-02-06 NOTE — Progress Notes (Deleted)
Did not feel well yesterday Poor appetite  Feels clammy  Denies dysuria,  Increased frequency Denies hematuria Feels nauseous since 2 weeks Repors poor appetite for the last couple of days Sleeps during the day time  Denies runny nose, nasal congestion

## 2023-02-07 LAB — CBC WITH DIFFERENTIAL/PLATELET
Absolute Monocytes: 486 {cells}/uL (ref 200–950)
Basophils Absolute: 38 {cells}/uL (ref 0–200)
Basophils Relative: 0.7 %
Eosinophils Absolute: 81 {cells}/uL (ref 15–500)
Eosinophils Relative: 1.5 %
HCT: 46.8 % — ABNORMAL HIGH (ref 35.0–45.0)
Hemoglobin: 15 g/dL (ref 11.7–15.5)
Lymphs Abs: 1361 {cells}/uL (ref 850–3900)
MCH: 28.6 pg (ref 27.0–33.0)
MCHC: 32.1 g/dL (ref 32.0–36.0)
MCV: 89.3 fL (ref 80.0–100.0)
MPV: 10.6 fL (ref 7.5–12.5)
Monocytes Relative: 9 %
Neutro Abs: 3434 {cells}/uL (ref 1500–7800)
Neutrophils Relative %: 63.6 %
Platelets: 236 10*3/uL (ref 140–400)
RBC: 5.24 10*6/uL — ABNORMAL HIGH (ref 3.80–5.10)
RDW: 13.3 % (ref 11.0–15.0)
Total Lymphocyte: 25.2 %
WBC: 5.4 10*3/uL (ref 3.8–10.8)

## 2023-02-07 LAB — COMPLETE METABOLIC PANEL WITH GFR
AG Ratio: 1.4 (calc) (ref 1.0–2.5)
ALT: 12 U/L (ref 6–29)
AST: 16 U/L (ref 10–35)
Albumin: 4.2 g/dL (ref 3.6–5.1)
Alkaline phosphatase (APISO): 72 U/L (ref 37–153)
BUN: 17 mg/dL (ref 7–25)
CO2: 26 mmol/L (ref 20–32)
Calcium: 9.5 mg/dL (ref 8.6–10.4)
Chloride: 100 mmol/L (ref 98–110)
Creat: 0.94 mg/dL (ref 0.60–0.95)
Globulin: 2.9 g/dL (ref 1.9–3.7)
Glucose, Bld: 112 mg/dL — ABNORMAL HIGH (ref 65–99)
Potassium: 4.8 mmol/L (ref 3.5–5.3)
Sodium: 134 mmol/L — ABNORMAL LOW (ref 135–146)
Total Bilirubin: 0.5 mg/dL (ref 0.2–1.2)
Total Protein: 7.1 g/dL (ref 6.1–8.1)
eGFR: 59 mL/min/{1.73_m2} — ABNORMAL LOW (ref >=60)

## 2023-02-07 LAB — URINALYSIS, ROUTINE W REFLEX MICROSCOPIC
Bilirubin Urine: NEGATIVE
Glucose, UA: NEGATIVE
Hgb urine dipstick: NEGATIVE
Ketones, ur: NEGATIVE
Leukocytes,Ua: NEGATIVE
Nitrite: NEGATIVE
Protein, ur: NEGATIVE
Specific Gravity, Urine: 1.018 (ref 1.001–1.035)
pH: 7.5 (ref 5.0–8.0)

## 2023-02-07 LAB — LIPID PANEL
Cholesterol: 155 mg/dL (ref <200)
HDL: 62 mg/dL (ref >=50)
LDL Cholesterol (Calc): 74 mg/dL
Non-HDL Cholesterol (Calc): 93 mg/dL (ref <130)
Total CHOL/HDL Ratio: 2.5 (calc) (ref <5.0)
Triglycerides: 107 mg/dL (ref <150)

## 2023-02-07 LAB — TSH: TSH: 1.15 m[IU]/L (ref 0.40–4.50)

## 2023-03-01 ENCOUNTER — Other Ambulatory Visit: Payer: Self-pay | Admitting: Internal Medicine

## 2023-03-01 ENCOUNTER — Other Ambulatory Visit: Payer: Self-pay | Admitting: Orthopedic Surgery

## 2023-03-01 DIAGNOSIS — G8929 Other chronic pain: Secondary | ICD-10-CM

## 2023-03-01 DIAGNOSIS — K219 Gastro-esophageal reflux disease without esophagitis: Secondary | ICD-10-CM

## 2023-03-01 NOTE — Telephone Encounter (Signed)
Patient has request refill on medication. Medication pend and sent to PCP Octavia Heir, NP for approval.

## 2023-03-12 ENCOUNTER — Encounter: Payer: Self-pay | Admitting: Neurology

## 2023-03-12 ENCOUNTER — Ambulatory Visit
Admission: RE | Admit: 2023-03-12 | Discharge: 2023-03-12 | Disposition: A | Discharge status: Home / Self Care | Payer: Medicare HMO | Source: Ambulatory Visit | Attending: Neurology | Admitting: Neurology

## 2023-03-12 ENCOUNTER — Ambulatory Visit: Payer: Medicare HMO | Admitting: Neurology

## 2023-03-12 VITALS — BP 132/63 | HR 56 | Ht 65.5 in | Wt 178.5 lb

## 2023-03-12 DIAGNOSIS — R269 Unspecified abnormalities of gait and mobility: Secondary | ICD-10-CM | POA: Diagnosis not present

## 2023-03-12 DIAGNOSIS — M25551 Pain in right hip: Secondary | ICD-10-CM

## 2023-03-12 MED ORDER — CELECOXIB 50 MG PO CAPS: 50.0000 mg | ORAL_CAPSULE | Freq: Two times a day (BID) | ORAL | 3 refills | Status: DC | PRN

## 2023-03-12 MED ORDER — GABAPENTIN 300 MG PO CAPS: 600.0000 mg | ORAL_CAPSULE | Freq: Three times a day (TID) | ORAL | 11 refills | Status: DC

## 2023-03-12 NOTE — Progress Notes (Unsigned)
Name: Julie Park DOB: 01-Oct-1933 MRN: [id number]  History of Present Illness: Julie Park is a 89 y.o. female who presents today as a new patient at [facility] [place]. All available relevant medical records have been reviewed.  - GU History: 1. OAB.  Recent history: > 02/06/2023:  - Seen by Internal Medicine for increased urinary urgency, frequency, nocturia, general malaise, and decreased appetite.  - UA negative.  - CBC with no leukocytosis (WBC 5.4). - CMP with GFR 59, creatinine 0.94. - The plan was:  1. Start Myrbetriq. 2. Limit caffeine intake.  3. Avoid OTC like Benadryl as it can cause urinary retention.   Today: She reports urinary ***frequency, ***nocturia, ***urgency, and ***urge incontinence. Voiding ***x/day and ***x/night on average. Leaking ***x/day on average; using *** ***pads / ***diapers per day on average.  She {Actions; denies-reports:120008} prior attempted treatment for these symptoms ***including ***  She {Actions; denies-reports:120008} caffeine intake (*** caffeinated beverages per day on average).  She {Actions; denies-reports:120008} history of obstructive sleep apnea and {Actions; denies-reports:120008} wearing CPAP routinely. ***She denies ever having a sleep study before. She {Actions; denies-reports:120008} fluid intake within 3 hours prior to bedtime. She {Actions; denies-reports:120008} fluid intake during the night.  She {Actions; denies-reports:120008} caffeine intake within 8 hours prior to bedtime. She {Actions; denies-reports:120008} routinely experiencing lower extremity edema during the day. ***She {Actions; denies-reports:120008} elevating feet during the day and/or wearing compression socks when lower extremity edema is present during the day. ***She {Actions; denies-reports:120008} monitoring dietary salt and sodium intake.  She {Actions; denies-reports:120008} dysuria, gross hematuria, straining to void, or  sensations of incomplete emptying.   Fall Screening: Do you usually have a device to assist in your mobility? {yes/no:20286} ***cane / ***walker / ***wheelchair  Medications: Current Outpatient Medications  Medication Sig Dispense Refill   acetaminophen (TYLENOL) 325 MG tablet Take 2 tablets (650 mg total) by mouth every 6 (six) hours as needed. 30 tablet 0   aspirin 81 MG tablet Take 81 mg by mouth every morning.      atorvastatin (LIPITOR) 10 MG tablet TAKE 1 TABLET EVERY DAY 90 tablet 1   denosumab (PROLIA) 60 MG/ML SOSY injection Inject 60 mg into the skin every 6 (six) months.     Fiber POWD Take by mouth.     fluticasone (FLONASE) 50 MCG/ACT nasal spray SPRAY 2 SPRAYS INTO EACH NOSTRIL EVERY DAY 48 mL 2   gabapentin (NEURONTIN) 300 MG capsule TAKE 1 CAPSULE BY MOUTH THREE TIMES A DAY 90 capsule 1   levothyroxine (SYNTHROID) 100 MCG tablet TAKE 1 TABLET BY MOUTH EVERY DAY 90 tablet 3   LORazepam (ATIVAN) 1 MG tablet Take 1 tablet (1 mg total) by mouth every 8 (eight) hours as needed. for anxiety 90 tablet 5   Magnesium 250 MG TABS Take 2 tablets by mouth daily.      metoprolol tartrate (LOPRESSOR) 25 MG tablet TAKE 1 TABLET BY MOUTH TWICE A DAY 180 tablet 2   mirabegron ER (MYRBETRIQ) 50 MG TB24 tablet Take 1 tablet (50 mg total) by mouth daily. 90 tablet 2   nitroGLYCERIN (NITROSTAT) 0.4 MG SL tablet Place 1 tablet (0.4 mg total) under the tongue every 5 (five) minutes x 3 doses as needed for chest pain (if no relief after 3rd dose, proceed to ED or call 911). 25 tablet 3   nystatin cream (MYCOSTATIN) APPLY 1 APPLICATION TOPICALLY AS NEEDED FOR DRY SKIN. 30 g 3   omeprazole (PRILOSEC) 40 MG capsule TAKE 1  CAPSULE EVERY DAY 90 capsule 1   Propylene Glycol (SYSTANE BALANCE) 0.6 % SOLN Apply to eye daily as needed.     sodium chloride (OCEAN) 0.65 % nasal spray Place 1 spray into the nose as needed for congestion. 30 mL 12   traMADol (ULTRAM) 50 MG tablet TAKE 1/2 TABLET BY MOUTH DAILY  15 tablet 3   No current facility-administered medications for this visit.    Allergies: No Known Allergies  Past Medical History:  Diagnosis Date   Anxiety    Chest pain 02/08/15   ETT normal   Diverticulosis    Diverticulitis (1982)   Fracture of rib of right side 08/16/2013   GERD (gastroesophageal reflux disease)    Hyperlipidemia, mixed    Hypothyroidism    IBS (irritable bowel syndrome)    Morton's neuroma    Right lumbar radiculopathy    Intermittent x 2 episodes in summer 2014   Shingles 1967 and 2010   Ulcer    Urine incontinence    Vertigo    Past Surgical History:  Procedure Laterality Date   CARDIOVASCULAR STRESS TEST  02/08/15   ETT normal   CATARACT EXTRACTION  2012   Dr. Elmer Picker   COLONOSCOPY  2005   [facility]. [facility]winda Passe at [facility] (normal per pt report)   DEXA  11/03/13   Heel T score -0.8.   FOOT NEUROMA SURGERY  1994   right foot   TONSILLECTOMY     TONSILLECTOMY AND ADENOIDECTOMY  1948   Dr. Linde Gillis [facility]   TUBAL LIGATION  1974   WISDOM TOOTH EXTRACTION     Family History  Problem Relation Age of Onset   Tuberculosis Mother    Pancreatic cancer Father    Asthma Daughter    Heart attack Paternal Grandfather    Social History   Socioeconomic History   Marital status: Single    Spouse name: Not on file   Number of children: Not on file   Years of education: Not on file   Highest education level: Not on file  Occupational History   Occupation: Retired  Tobacco Use   Smoking status: Former    Current packs/day: 1.00    Average packs/day: 1 pack/day for 39.7 years (39.7 ttl pk-yrs)    Types: Cigarettes    Start date: 06/26/1983   Smokeless tobacco: Never  Vaping Use   Vaping status: Never Used  Substance and Sexual Activity   Alcohol use: No   Drug use: No   Sexual activity: Not Currently  Other Topics Concern   Not on file  Social History Narrative   Divorced, 2 children.   Lives in [place].   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   Tobacco: small amount, distant past.  Alcohol: none.           Social History      Diet? n/a      Do you drink/eat things with caffeine? coffee      Marital status?     Divorced (1984)                              What year were you married? 1958      Do you live in a house, apartment, assisted living, condo, trailer, etc.? house      Is it one or more stories?  yes  How many persons live in your home? Living with daughter and husband at the present      Do you have any pets in your home? (please list) no       Highest level of education completed? 16 years      Current or past profession: teacher grades 1 and 2, education coordinator [employer]       Do you exercise?                   yes                   Type & how often? Walk 1-3 times per week      Advanced Directives      Do you have a living will? yes      Do you have a DNR form?                                  If not, do you want to discuss one?      Do you have signed POA/HPOA for forms? yes      Functional Status      Do you have difficulty bathing or dressing yourself?      Do you have difficulty preparing food or eating?       Do you have difficulty managing your medications?      Do you have difficulty managing your finances?      Do you have difficulty affording your medications?      Social Determinants of Health   Financial Resource Strain: Low Risk  (09/27/2022)   Overall Financial Resource Strain (CARDIA)    Difficulty of Paying Living Expenses: Not hard at all  Food Insecurity: No Food Insecurity (09/27/2022)   Hunger Vital Sign    Worried About Running Out of Food in the Last Year: Never true    Ran Out of Food in the Last Year: Never true  Transportation Needs: No Transportation Needs (09/27/2022)   PRAPARE - Administrator, Civil Service (Medical): No    Lack of Transportation  (Non-Medical): No  Physical Activity: Inactive (09/27/2022)   Exercise Vital Sign    Days of Exercise per Week: 0 days    Minutes of Exercise per Session: 0 min  Stress: No Stress Concern Present (09/27/2022)   [employer] of Occupational Health - Occupational Stress Questionnaire    Feeling of Stress : Only a little  Social Connections: Socially Isolated (09/27/2022)   Social Connection and Isolation Panel [NHANES]    Frequency of Communication with Friends and Family: Three times a week    Frequency of Social Gatherings with Friends and Family: More than three times a week    Attends Religious Services: Never    Database administrator or Organizations: Not on file    Attends Banker Meetings: Never    Marital Status: Divorced  Catering manager Violence: Not At Risk (09/27/2022)   Humiliation, Afraid, Rape, and Kick questionnaire    Fear of Current or Ex-Partner: No    Emotionally Abused: No    Physically Abused: No    Sexually Abused: No    SUBJECTIVE  Review of Systems Constitutional: Patient ***denies any unintentional weight loss or change in strength lntegumentary: Patient ***denies any rashes or pruritus Eyes: Patient denies ***dry eyes ENT: Patient ***denies dry mouth Cardiovascular: Patient ***denies chest pain or syncope  Respiratory: Patient ***denies shortness of breath Gastrointestinal: Patient ***denies nausea, vomiting, constipation, or diarrhea Musculoskeletal: Patient ***denies muscle cramps or weakness Neurologic: Patient ***denies convulsions or seizures Psychiatric: Patient ***denies memory problems Allergic/Immunologic: Patient ***denies recent allergic reaction(s) Hematologic/Lymphatic: Patient denies bleeding tendencies Endocrine: Patient ***denies heat/cold intolerance  GU: As per HPI.  OBJECTIVE There were no vitals filed for this visit. There is no height or weight on file to calculate BMI.  Physical Examination  Constitutional:  ***No obvious distress; patient is ***non-toxic appearing  Cardiovascular: ***No visible lower extremity edema.  Respiratory: The patient does ***not have audible wheezing/stridor; respirations do ***not appear labored  Gastrointestinal: Abdomen ***non-distended Musculoskeletal: ***Normal ROM of UEs  Skin: ***No obvious rashes/open sores  Neurologic: CN 2-12 grossly ***intact Psychiatric: Answered questions ***appropriately with ***normal affect  Hematologic/Lymphatic/Immunologic: ***No obvious bruises or sites of spontaneous bleeding  UA: ***negative / *** WBC/hpf, *** RBC/hpf, bacteria (***) PVR: *** ml  ASSESSMENT Functional urinary incontinence  Urinary incontinence, unspecified type  We discussed the symptoms of overactive bladder (OAB), which include urinary urgency, frequency, nocturia, with or without urge incontinence.   While we may not know the exact etiology of OAB, several risk factors can be identified.  - We discussed this patient's neurogenic risk factors for OAB-type symptoms including ***T2DM, ***nicotine use, ***.  - Likely exacerbated by ***diuretic use, ***caffeine intake, ***consumption of bladder irritants such as (acidic foods, spicy foods, alcohol).   We discussed the following management options in detail including potential benefits, risks, and side effects: Behavioral therapy: Modify fluid intake Decreasing bladder irritants (such as caffeine, acidic foods, spicy foods, alcohol) Urge suppression strategies Bladder retraining / timed voiding Double voiding Medication(s): - For anticholinergic medications, we discussed the potential side effects of anticholinergics including dry eyes, dry mouth, constipation, cognitive impairment and urinary retention.  - For beta-3 agonist medication, we discussed the risk for urinary retention and the potential side effect of elevated blood pressure specific to Myrbetriq (which is more likely to occur in individuals with  uncontrolled hypertension).  For refractory cases: PTNS (posterior tibial nerve stimulation) Sacral neuromodulation trial (Medtronic lnterStim or Axonics implant) Bladder Botox injections In extreme cases, SP tube placement  ***In order to further evaluate urinary incontinence, She was instructed to complete a 3-day bladder diary.  ***Discussed recommendation for urodynamic testing, which was described in detail including risks such as bleeding, infection, organ/tissue/nerve damage.  She decided to *** ***work on behavioral modifications including ***minimizing caffeine intake and working on ***timed voiding.  Will plan for follow up in ***8 weeks / *** months or sooner if needed. Pt verbalized understanding and agreement. All questions were answered.  PLAN Advised the following: *** ***. Minimize caffeine intake. ***. Work on timed voiding. ***. Urge suppression strategies. ***. Double/ triple voiding. ***. No follow-ups on file.  No orders of the defined types were placed in this encounter.   It has been explained that the patient is to follow regularly with their PCP in addition to all other providers involved in their care and to follow instructions provided by these respective offices. Patient advised to contact urology clinic if any urologic-pertaining questions, concerns, new symptoms or problems arise in the interim period.  There are no Patient Instructions on file for this visit.  Electronically signed by:  Donnita Falls, MSN, FNP-C, CUNP 03/12/2023 10:00 AM

## 2023-03-12 NOTE — Progress Notes (Signed)
Chief Complaint  Patient presents with   Gait Problem    Rm14, daughter Julie Park), Gait: uses cane /off balance (since 2020 since break of vertebrae)       ASSESSMENT AND PLAN  Julie Park is a 89 y.o. female   Back pain, radiating pain along her spine, right hip pain, History of T8 compression fracture, no evidence of cord compression  Most likely musculoskeletal etiology  X-ray of right hip  Physical therapy  Gabapentin was helpful, we will try higher dose 300 mg 2 tablets 3 times a day, Celebrex 50 mg as needed  DIAGNOSTIC DATA (LABS, IMAGING, TESTING) - I reviewed patient records, labs, notes, testing and imaging myself where available.   MEDICAL HISTORY:  Julie Park, is a 89 year old female, seen in request by her primary care nurse practitioner   Julie Park, Julie Park, for evaluation of gait abnormality, initial evaluation was with her daughter on April 25, 2021  I reviewed and summarized the referring note. PMHx HLD Hypothyroidism Thoracic fracture Shingles, Left V2,  Left thoracic region Osteoporosis  She had a history of osteoporosis, received treatment over the past few years, suffered a compression fracture in September 2020, had a severe upper back pain, had conservative treatment, ever since then, she noticed some changes, she noticed mildly unsteady gait, tends to furniture walk at home, use cane for longer distances, she also noticed urinary urgency, frequency, occasionally urinary accident, denies bowel incident,  In addition, she complains of constant bilateral feet numbness, cold sensation, has to use heating pad, denies upper extremity paresthesia or weakness, denies significant neck pain  She also complains of low back pain, radiating pain to right hip, right lower extremity, sometimes upper back hugging sensation around her abdomen  UPDATE Sept 17 2024: She is accompanied by his daughter at today's clinical visit, her low back pain, radiating pain to  lower extremity overall has improved, she can ambulate without assistant, but with prolonged standing, she would develop midline low back pain, radiating along her spine, often feel tense paraspinal muscles  She denies bowel and bladder incontinence,  Personally reviewed MRI thoracic spine from November 2022, T8 50% compression fracture, no evidence of cord compression  She was given gabapentin 300 mg 3 times a day, denies significant side effect, did help her sleep, and low back pain,   PHYSICAL EXAM:   Vitals:   03/12/23 1007  BP: 132/63  Pulse: (!) 56  Weight: 178 lb 8 oz (81 kg)  Height: 5' 5.5" (1.664 m)   Not recorded     Body mass index is 29.25 kg/m.  PHYSICAL EXAMNIATION:  Gen: NAD, conversant, well nourised, well groomed                     Cardiovascular: Regular rate rhythm, no peripheral edema, warm, nontender. Eyes: Conjunctivae clear without exudates or hemorrhage Neck: Supple, no carotid bruits. Pulmonary: Clear to auscultation bilaterally   NEUROLOGICAL EXAM:  MENTAL STATUS: Speech/cognition: awake, alert, oriented to history taking and casual conversation, CRANIAL NERVES: CN II: Visual fields are full to confrontation. Pupils are round equal and briskly reactive to light. CN III, IV, VI: extraocular movement are normal. No ptosis. CN V: Facial sensation is intact to light touch CN VII: Face is symmetric with normal eye closure  CN VIII: Hearing is normal to causal conversation. CN IX, X: Phonation is normal. CN XI: Head turning and shoulder shrug are intact  MOTOR: Upper extremity motor strength is normal,  right hip pain with cross leg examination, no significant muscle weakness  REFLEXES: Reflexes are 2 and symmetric at the biceps, triceps, knees, and ankles. Plantar responses are flexor.  SENSORY: Intact to light touch, pinprick and vibratory sensation are intact in fingers and toes.  COORDINATION: There is no trunk or limb dysmetria  noted.  GAIT/STANCE: Able to get up from seated position arm crossed, cautious, mildly unsteady  REVIEW OF SYSTEMS:  Full 14 system review of systems performed and notable only for as above All other review of systems were negative.   ALLERGIES: No Known Allergies  HOME MEDICATIONS: Current Outpatient Medications  Medication Sig Dispense Refill   acetaminophen (TYLENOL) 325 MG tablet Take 2 tablets (650 mg total) by mouth every 6 (six) hours as needed. 30 tablet 0   aspirin 81 MG tablet Take 81 mg by mouth every morning.      atorvastatin (LIPITOR) 10 MG tablet TAKE 1 TABLET EVERY DAY 90 tablet 1   denosumab (PROLIA) 60 MG/ML SOSY injection Inject 60 mg into the skin every 6 (six) months.     Fiber POWD Take by mouth.     fluticasone (FLONASE) 50 MCG/ACT nasal spray SPRAY 2 SPRAYS INTO EACH NOSTRIL EVERY DAY 48 mL 2   gabapentin (NEURONTIN) 300 MG capsule TAKE 1 CAPSULE BY MOUTH THREE TIMES A DAY 90 capsule 1   levothyroxine (SYNTHROID) 100 MCG tablet TAKE 1 TABLET BY MOUTH EVERY DAY 90 tablet 3   LORazepam (ATIVAN) 1 MG tablet Take 1 tablet (1 mg total) by mouth every 8 (eight) hours as needed. for anxiety 90 tablet 5   Magnesium 250 MG TABS Take 2 tablets by mouth daily.      metoprolol tartrate (LOPRESSOR) 25 MG tablet TAKE 1 TABLET BY MOUTH TWICE A DAY 180 tablet 2   nitroGLYCERIN (NITROSTAT) 0.4 MG SL tablet Place 1 tablet (0.4 mg total) under the tongue every 5 (five) minutes x 3 doses as needed for chest pain (if no relief after 3rd dose, proceed to ED or call 911). 25 tablet 3   nystatin cream (MYCOSTATIN) APPLY 1 APPLICATION TOPICALLY AS NEEDED FOR DRY SKIN. 30 g 3   omeprazole (PRILOSEC) 40 MG capsule TAKE 1 CAPSULE EVERY DAY 90 capsule 1   Propylene Glycol (SYSTANE BALANCE) 0.6 % SOLN Apply to eye daily as needed.     sodium chloride (OCEAN) 0.65 % nasal spray Place 1 spray into the nose as needed for congestion. 30 mL 12   traMADol (ULTRAM) 50 MG tablet TAKE 1/2 TABLET BY  MOUTH DAILY 15 tablet 3   No current facility-administered medications for this visit.    PAST MEDICAL HISTORY: Past Medical History:  Diagnosis Date   Anxiety    Chest pain 02/08/15   ETT normal   Diverticulosis    Diverticulitis (1982)   Fracture of rib of right side 08/16/2013   GERD (gastroesophageal reflux disease)    Hyperlipidemia, mixed    Hypothyroidism    IBS (irritable bowel syndrome)    Morton's neuroma    Right lumbar radiculopathy    Intermittent x 2 episodes in summer 2014   Shingles 1967 and 2010   Ulcer    Urine incontinence    Vertigo     PAST SURGICAL HISTORY: Past Surgical History:  Procedure Laterality Date   CARDIOVASCULAR STRESS TEST  02/08/15   ETT normal   CATARACT EXTRACTION  2012   Dr. Elmer Picker   COLONOSCOPY  2005   Dr. Gwinda Passe at  [facility] (normal per pt report)   DEXA  11/03/13   Heel T score -0.8.   FOOT NEUROMA SURGERY  1994   right foot   TONSILLECTOMY     TONSILLECTOMY AND ADENOIDECTOMY  1948   Dr. Linde Gillis [facility]   TUBAL LIGATION  1974   WISDOM TOOTH EXTRACTION      FAMILY HISTORY: Family History  Problem Relation Age of Onset   Tuberculosis Mother    Pancreatic cancer Father    Asthma Daughter    Heart attack Paternal Grandfather     SOCIAL HISTORY: Social History   Socioeconomic History   Marital status: Single    Spouse name: Not on file   Number of children: Not on file   Years of education: Not on file   Highest education level: Not on file  Occupational History   Occupation: Retired  Tobacco Use   Smoking status: Former    Current packs/day: 1.00    Average packs/day: 1 pack/day for 39.7 years (39.7 ttl pk-yrs)    Types: Cigarettes    Start date: 06/26/1983   Smokeless tobacco: Never  Vaping Use   Vaping status: Never Used  Substance and Sexual Activity   Alcohol use: No   Drug use: No   Sexual activity: Not Currently  Other Topics Concern   Not on file  Social History Narrative   Divorced, 2  children.   Lives in [place].   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   Tobacco: small amount, distant past.  Alcohol: none.           Social History      Diet? n/a      Do you drink/eat things with caffeine? coffee      Marital status?     Divorced (1984)                              What year were you married? 1958      Do you live in a house, apartment, assisted living, condo, trailer, etc.? house      Is it one or more stories?  yes      How many persons live in your home? Living with daughter and husband at the present      Do you have any pets in your home? (please list) no       Highest level of education completed? 16 years      Current or past profession: teacher grades 1 and 2, education coordinator [employer]       Do you exercise?                   yes                   Type & how often? Walk 1-3 times per week      Advanced Directives      Do you have a living will? yes      Do you have a DNR form?                                  If not, do you want to discuss one?      Do you have signed POA/HPOA for forms? yes      Functional  Status      Do you have difficulty bathing or dressing yourself?      Do you have difficulty preparing food or eating?       Do you have difficulty managing your medications?      Do you have difficulty managing your finances?      Do you have difficulty affording your medications?      Social Determinants of Health   Financial Resource Strain: Low Risk  (09/27/2022)   Overall Financial Resource Strain (CARDIA)    Difficulty of Paying Living Expenses: Not hard at all  Food Insecurity: No Food Insecurity (09/27/2022)   Hunger Vital Sign    Worried About Running Out of Food in the Last Year: Never true    Ran Out of Food in the Last Year: Never true  Transportation Needs: No Transportation Needs (09/27/2022)   PRAPARE - Scientist, research (physical sciences) (Medical): No    Lack of Transportation (Non-Medical): No  Physical Activity: Inactive (09/27/2022)   Exercise Vital Sign    Days of Exercise per Week: 0 days    Minutes of Exercise per Session: 0 min  Stress: No Stress Concern Present (09/27/2022)   [employer] of Occupational Health - Occupational Stress Questionnaire    Feeling of Stress : Only a little  Social Connections: Socially Isolated (09/27/2022)   Social Connection and Isolation Panel [NHANES]    Frequency of Communication with Friends and Family: Three times a week    Frequency of Social Gatherings with Friends and Family: More than three times a week    Attends Religious Services: Never    Database administrator or Organizations: Not on file    Attends Banker Meetings: Never    Marital Status: Divorced  Catering manager Violence: Not At Risk (09/27/2022)   Humiliation, Afraid, Rape, and Kick questionnaire    Fear of Current or Ex-Partner: No    Emotionally Abused: No    Physically Abused: No    Sexually Abused: No      Levert Feinstein, M.D. Ph.D.  [facility] [address] [place], [place] [address] Ph: [phone] Fax: [phone]  CC:  Octavia Heir, NP [address] [place],  [place] [address]  Octavia Heir, NP

## 2023-03-13 ENCOUNTER — Encounter: Payer: Self-pay | Admitting: Urology

## 2023-03-13 ENCOUNTER — Ambulatory Visit: Payer: Medicare HMO | Admitting: Urology

## 2023-03-13 VITALS — BP 147/68 | HR 60 | Temp 98.6°F

## 2023-03-13 DIAGNOSIS — R351 Nocturia: Secondary | ICD-10-CM | POA: Diagnosis not present

## 2023-03-13 DIAGNOSIS — N3281 Overactive bladder: Secondary | ICD-10-CM | POA: Diagnosis not present

## 2023-03-13 DIAGNOSIS — R3981 Functional urinary incontinence: Secondary | ICD-10-CM

## 2023-03-13 DIAGNOSIS — R0683 Snoring: Secondary | ICD-10-CM

## 2023-03-13 DIAGNOSIS — N3941 Urge incontinence: Secondary | ICD-10-CM | POA: Diagnosis not present

## 2023-03-13 DIAGNOSIS — R32 Unspecified urinary incontinence: Secondary | ICD-10-CM

## 2023-03-13 LAB — BLADDER SCAN AMB NON-IMAGING: Scan Result: 0

## 2023-03-13 LAB — URINALYSIS, ROUTINE W REFLEX MICROSCOPIC
Bilirubin, UA: NEGATIVE
Glucose, UA: NEGATIVE
Ketones, UA: NEGATIVE
Nitrite, UA: NEGATIVE
Protein,UA: NEGATIVE
RBC, UA: NEGATIVE
Specific Gravity, UA: 1.02 (ref 1.005–1.030)
Urobilinogen, Ur: 0.2 mg/dL (ref 0.2–1.0)
pH, UA: 6 (ref 5.0–7.5)

## 2023-03-13 LAB — MICROSCOPIC EXAMINATION: Bacteria, UA: NONE SEEN

## 2023-03-13 MED ORDER — MIRABEGRON ER 50 MG PO TB24: 50.0000 mg | ORAL_TABLET | Freq: Every day | ORAL | 3 refills | Status: DC

## 2023-03-13 NOTE — Patient Instructions (Addendum)
PLAN Advised the following: 1. Start Myrbetriq 50 mg nightly. 2. Work on timed voiding. 3. Urge suppression strategies. 4. Double voiding. 5. Speak with your Primary Care Provider about ordering a sleep study to evaluate for obstructive sleep apnea.  6. Minimizing caffeine intake (especially within 6-8 hours before bedtime). 7. Minimizing fluid intake within 3 hours before bedtime. 8. Minimizing overnight fluid intake. 9. If bilateral lower extremity edema is present: Elevating feet during the day and/or wearing compression socks. 10. Return in about 8 weeks for follow up with Julie Park, Urology NP.         Overactive bladder (OAB) overview for patients:  Symptoms may include: urinary urgency ("gotta go" feeling) urinary frequency (voiding >8 times per day) night time urination (nocturia) urge incontinence of urine (UUI)  While we do not know the exact etiology of OAB, several treatment options exist including:  Behavioral therapy: Reducing fluid intake Decreasing bladder stimulants (such as caffeine) and irritants (such as acidic food, spicy foods, alcohol) Urge suppression strategies Bladder retraining via timed voiding  Pelvic floor physical therapy  Medication(s) - can use one or both of the drug classes below. Anticholinergic / antimuscarinic medications:  Mechanism of action: Activate M3 receptors to reduce detrusor stimulation and increase bladder capacity  (parasympathetic nervous system). Effect: Relaxes the bladder to decrease overactivity, increase bladder storage capacity, and increase time between voids. Onset: Slow acting (may take 8-12 weeks to determine efficacy). Medications include: Vesicare (Solifenacin), Ditropan (Oxybutynin), Detrol (Tolterodine), Toviaz (Fesoterodine), Sanctura (Trospium), Urispas (Flavoxate), Enablex (Darifenacin), Bentyl (Dicyclomine), Levsin (Hyoscyamine ). Potential side effects include but are not limited to: Dry eyes, dry  mouth, constipation, cognitive impairment, dementia risk with long term use, and urinary retention/ incomplete bladder emptying. Insurance companies generally prefer for patients to try 1-2 anticholinergic / antimuscarinic medications first due to low cost. Some exceptions are made based on patient-specific comorbidities / risk factors. Beta-3 agonist medications: Mechanism of action: Stimulates selective B3 adrenergic receptors to cause smooth muscle bladder relaxation (sympathetic nervous system). Effect: Relaxes the bladder to decrease overactivity, increase bladder storage capacity, and increase time between voids. Onset: Slow acting (may take 8-12 weeks to determine efficacy). Medications include: Myrbetriq (Mirabegron) and Vibegron Julie Park). Potential side effects include but are not limited to: urinary retention / incomplete bladder emptying and elevated blood pressure (more likely to occur in individuals with pre-existing uncontrolled hypertension). These medications tend to be more expensive than the anticholinergic / antimuscarinic medications.   For patients with refractory OAB (if the above treatment options have been unsuccessful): Posterior tibial nerve stimulation (PTNS). Small acupuncture-type needle inserted near ankle with electric current to stimulate bladder via posterior tibial nerve pathway. Initially requires 12 weekly in-office treatments lasting 30 minutes each; followed by monthly in-office treatments lasting 30 minutes each for 1 year.  Bladder Botox injections. How it is done: Typically done via in-office cystoscopy; sometimes done in the OR depending on the situation. The bladder is numbed with lidocaine instilled via a catheter. Then the urologist injects Botox into the bladder muscle wall in about 20 locations. Causes local paralysis of the bladder muscle at the injection sites to reduce bladder muscle overactivity / spasms. The effect lasts for approximately 6 months  and cannot be reversed once performed. Risks may included but are not limited to: infection, incomplete bladder emptying/ urinary retention, short term need for self-catheterization or indwelling catheter, and need for repeat therapy. There is a 5-12% chance of needing to catheterize with Botox - that usually resolves  in a few months as the Botox wears off. Typically Botox injections would need to be repeated every 3-12 months since this is not a permanent therapy.  Sacral neuromodulation trial (Medtronic lnterStim or Axonics implant). Sacral neuromodulation is FDA-approved for uncontrolled urinary urgency, urinary frequency, urinary urge incontinence, non-obstructive urinary retention, or fecal incontinence. It is not FDA-approved as a treatment for pain. The goal of this therapy is at least a 50% improvement in symptoms. It is NOT realistic to expect a 100% cure. This is a a 2-step outpatient procedure. After a successful test period, a permanent wire and generator are placed in the OR. We discussed the risk of infection. We reviewed the fact that about 30% of patients fail the test phase and are not candidates for permanent generator placement. During the 1-2 week trial phase, symptoms are documented by the patient to determine response. If patient gets at least a 50% improvement in symptoms, they may then proceed with Step 2. Step 1: Trial lead placement. Per physician discretion, may done one of two ways: Percutaneous nerve evaluation (PNE) in the [facility] urology office. Performed by urologist under local anesthesia (numbing the area with lidocaine) using a spinal needle for placement of test wire, which usually stays in place for 5-7 days to determine therapy response. Test lead placement in OR under anesthesia. Usually stays in place 2 weeks to determine therapy response. > Step 2: Permanent implantation of sacral neuromodulation device, which is performed in the OR.  Sacral neuromodulation  implants: All are conditionally MRI safe. Manufacturer: Medtronic Website: [employer].gl therapy/right-for-you.html Options: lnterStim X: Non-rechargeable. The battery lasts 10 years on average. lnterStim Micro: Rechargeable. The battery lasts 15 years on average and must be charged routinely. Approximately 50% smaller implant than lnterStim X implant.  Manufacturer: Axonics Website: Findrealrelief.axonics.com Options: Non-rechargeable (Axonics F15): The battery lasts 15 years on average. Rechargeable (Axonics R20): The battery lasts 20 years on average and must be charged in office for about 1 hour every 6-10 months on average. Approximately 50% smaller implant than Axonics non-rechargeable implant.  Note: Generally the rechargeable devices are only advised for very small or thin patients who may not have sufficient adipose tissue to comfortably overlay the implanted device.  Suprapubic catheter (SP tube) placement. Only done in severely refractory OAB when all other options have failed or are not a viable treatment choice depending on patient factors. Involves placement of a catheter through the lower abdomen into the bladder to continuously drain the bladder into an external collection bag, which patient can then empty at their convenience every few hours. Done via an outpatient surgical procedure in the OR under anesthesia. Risks may included but are not limited to: surgical site pain, infections, skin irritation / breakdown, chronic bacteriuria, symptomatic UTls. The SP tube must stay in place continuously. This is a reversible procedure however - the insertion site will close if catheter is removed for more than a few hours. The SP tube must be exchanged routinely every 4 weeks to prevent the catheter from becoming clogged with sediment. SP tube exchanges are typically performed at a urology nurse visit or by  a home health nurse.

## 2023-03-14 ENCOUNTER — Encounter: Payer: Self-pay | Admitting: Neurology

## 2023-03-26 ENCOUNTER — Other Ambulatory Visit: Payer: Self-pay | Admitting: Orthopedic Surgery

## 2023-03-26 DIAGNOSIS — E039 Hypothyroidism, unspecified: Secondary | ICD-10-CM

## 2023-03-28 ENCOUNTER — Other Ambulatory Visit: Payer: Self-pay | Admitting: Family

## 2023-03-28 DIAGNOSIS — R0981 Nasal congestion: Secondary | ICD-10-CM

## 2023-03-28 NOTE — Telephone Encounter (Signed)
Patient is requesting refill on medication not on list. Medication pend and sent to PCP Octavia Heir, NP

## 2023-04-03 ENCOUNTER — Other Ambulatory Visit (HOSPITAL_BASED_OUTPATIENT_CLINIC_OR_DEPARTMENT_OTHER): Payer: Self-pay

## 2023-04-03 MED ORDER — COVID-19 MRNA VAC-TRIS(PFIZER) 30 MCG/0.3ML IM SUSY
0.3000 mL | PREFILLED_SYRINGE | Freq: Once | INTRAMUSCULAR | 0 refills | Status: AC
  Filled 2023-04-03: qty 0.3, 1d supply, fill #0

## 2023-04-03 MED ORDER — INFLUENZA VAC A&B SURF ANT ADJ 0.5 ML IM SUSY
0.5000 mL | PREFILLED_SYRINGE | Freq: Once | INTRAMUSCULAR | 0 refills | Status: AC
  Filled 2023-04-03: qty 0.5, 1d supply, fill #0

## 2023-04-22 ENCOUNTER — Ambulatory Visit: Payer: Medicare HMO | Admitting: Physical Therapy

## 2023-05-09 NOTE — Progress Notes (Deleted)
Name: Julie Park DOB: 1933-10-08 MRN: [id number]  History of Present Illness: Julie Park is a 89 y.o. female who presents today for follow up visit at [facility]. - GU history: 1. OAB with urinary frequency, nocturia x2-3, urgency, and urge incontinence.  At initial visit on 03/13/2023: The plan was: Advised the following: 1. Start Myrbetriq 50 mg nightly. 2. Work on timed voiding. 3. Urge suppression strategies. 4. Double voiding. 5. Pt to speak with PCP about ordering a sleep study to evaluate for obstructive sleep apnea.  6. Minimizing caffeine intake (especially within 6-8 hours before bedtime). 7. Minimizing fluid intake within 3 hours before bedtime. 8. Minimizing overnight fluid intake. 9. If bilateral lower extremity edema is present: Elevating feet during the day and/or wearing compression socks. 10. Return in about 8 weeks (around 05/08/2023) for UA, PVR, & f/u with Evette Georges NP.  Since last visit: ***  Today: She {Actions; denies-reports:120008} symptomatic improvement since starting Myrbetriq (Mirabegron) 50 mg daily.  She reports {Blank multiple:19197::"improved","persistent / unchanged"} urinary ***frequency, ***nocturia, ***urgency, and ***urge incontinence. Voiding ***x/day and ***x/night on average. Leaking ***x/day on average; using *** ***pads / ***diapers per day on average.  She {Actions; denies-reports:120008} caffeine intake (*** caffeinated beverages per day on average).  She {Actions; denies-reports:120008} dysuria, gross hematuria, straining to void, or sensations of incomplete emptying.   Fall Screening: Do you usually have a device to assist in your mobility? {yes/no:20286} ***cane / ***walker / ***wheelchair   Medications: Current Outpatient Medications  Medication Sig Dispense Refill   acetaminophen (TYLENOL) 325 MG tablet Take 2 tablets (650 mg total) by mouth every 6 (six) hours as needed. 30 tablet 0    aspirin 81 MG tablet Take 81 mg by mouth every morning.      atorvastatin (LIPITOR) 10 MG tablet TAKE 1 TABLET EVERY DAY 90 tablet 1   celecoxib (CELEBREX) 50 MG capsule Take 1 capsule (50 mg total) by mouth 2 (two) times daily as needed for pain. 60 capsule 3   denosumab (PROLIA) 60 MG/ML SOSY injection Inject 60 mg into the skin every 6 (six) months.     Fiber POWD Take by mouth.     fluticasone (FLONASE) 50 MCG/ACT nasal spray SPRAY 2 SPRAYS INTO EACH NOSTRIL EVERY DAY 48 mL 2   gabapentin (NEURONTIN) 300 MG capsule Take 2 capsules (600 mg total) by mouth 3 (three) times daily. 180 capsule 11   levothyroxine (SYNTHROID) 100 MCG tablet TAKE 1 TABLET BY MOUTH EVERY DAY 90 tablet 3   loratadine (CLARITIN) 10 MG tablet TAKE 1 TABLET BY MOUTH EVERY DAY 90 tablet 3   LORazepam (ATIVAN) 1 MG tablet Take 1 tablet (1 mg total) by mouth every 8 (eight) hours as needed. for anxiety 90 tablet 5   Magnesium 250 MG TABS Take 2 tablets by mouth daily.      metoprolol tartrate (LOPRESSOR) 25 MG tablet TAKE 1 TABLET BY MOUTH TWICE A DAY 180 tablet 2   mirabegron ER (MYRBETRIQ) 50 MG TB24 tablet Take 1 tablet (50 mg total) by mouth daily. 90 tablet 3   nitroGLYCERIN (NITROSTAT) 0.4 MG SL tablet Place 1 tablet (0.4 mg total) under the tongue every 5 (five) minutes x 3 doses as needed for chest pain (if no relief after 3rd dose, proceed to ED or call 911). 25 tablet 3   nystatin cream (MYCOSTATIN) APPLY 1 APPLICATION TOPICALLY AS NEEDED FOR DRY SKIN. 30 g 3   omeprazole (PRILOSEC) 40 MG capsule  TAKE 1 CAPSULE EVERY DAY 90 capsule 1   Propylene Glycol (SYSTANE BALANCE) 0.6 % SOLN Apply to eye daily as needed.     sodium chloride (OCEAN) 0.65 % nasal spray Place 1 spray into the nose as needed for congestion. 30 mL 12   traMADol (ULTRAM) 50 MG tablet TAKE 1/2 TABLET BY MOUTH DAILY 15 tablet 3   No current facility-administered medications for this visit.    Allergies: No Known Allergies  Past Medical History:   Diagnosis Date   Anxiety    Chest pain 02/08/15   ETT normal   Diverticulosis    Diverticulitis (1982)   Fracture of rib of right side 08/16/2013   GERD (gastroesophageal reflux disease)    Hyperlipidemia, mixed    Hypothyroidism    IBS (irritable bowel syndrome)    Morton's neuroma    Right lumbar radiculopathy    Intermittent x 2 episodes in summer 2014   Shingles 1967 and 2010   Ulcer    Urine incontinence    Vertigo    Past Surgical History:  Procedure Laterality Date   CARDIOVASCULAR STRESS TEST  02/08/15   ETT normal   CATARACT EXTRACTION  2012   Dr. Elmer Picker   COLONOSCOPY  2005   Dr. Gwinda Passe at [facility] (normal per pt report)   DEXA  11/03/13   Heel T score -0.8.   FOOT NEUROMA SURGERY  1994   right foot   TONSILLECTOMY     TONSILLECTOMY AND ADENOIDECTOMY  1948   Dr. Linde Gillis [facility]   TUBAL LIGATION  1974   WISDOM TOOTH EXTRACTION     Family History  Problem Relation Age of Onset   Tuberculosis Mother    Pancreatic cancer Father    Asthma Daughter    Heart attack Paternal Grandfather    Social History   Socioeconomic History   Marital status: Single    Spouse name: Not on file   Number of children: Not on file   Years of education: Not on file   Highest education level: Not on file  Occupational History   Occupation: Retired  Tobacco Use   Smoking status: Former    Current packs/day: 1.00    Average packs/day: 1 pack/day for 39.9 years (39.9 ttl pk-yrs)    Types: Cigarettes    Start date: 06/26/1983   Smokeless tobacco: Never  Vaping Use   Vaping status: Never Used  Substance and Sexual Activity   Alcohol use: No   Drug use: No   Sexual activity: Not Currently  Other Topics Concern   Not on file  Social History Narrative   Divorced, 2 children.   Lives in [place].   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   Tobacco: small amount, distant past.  Alcohol: none.            Social History      Diet? n/a      Do you drink/eat things with caffeine? coffee      Marital status?     Divorced (1984)                              What year were you married? 1958      Do you live in a house, apartment, assisted living, condo, trailer, etc.? house      Is it one or more stories?  yes      How many persons live in your home? Living with daughter and husband at the present      Do you have any pets in your home? (please list) no       Highest level of education completed? 16 years      Current or past profession: teacher grades 1 and 2, education coordinator [employer]       Do you exercise?                   yes                   Type & how often? Walk 1-3 times per week      Advanced Directives      Do you have a living will? yes      Do you have a DNR form?                                  If not, do you want to discuss one?      Do you have signed POA/HPOA for forms? yes      Functional Status      Do you have difficulty bathing or dressing yourself?      Do you have difficulty preparing food or eating?       Do you have difficulty managing your medications?      Do you have difficulty managing your finances?      Do you have difficulty affording your medications?      Social Determinants of Health   Financial Resource Strain: Low Risk  (09/27/2022)   Overall Financial Resource Strain (CARDIA)    Difficulty of Paying Living Expenses: Not hard at all  Food Insecurity: No Food Insecurity (09/27/2022)   Hunger Vital Sign    Worried About Running Out of Food in the Last Year: Never true    Ran Out of Food in the Last Year: Never true  Transportation Needs: No Transportation Needs (09/27/2022)   PRAPARE - Administrator, Civil Service (Medical): No    Lack of Transportation (Non-Medical): No  Physical Activity: Inactive (09/27/2022)   Exercise Vital Sign    Days of Exercise per Week: 0 days    Minutes of Exercise per Session:  0 min  Stress: No Stress Concern Present (09/27/2022)   [employer] of Occupational Health - Occupational Stress Questionnaire    Feeling of Stress : Only a little  Social Connections: Socially Isolated (09/27/2022)   Social Connection and Isolation Panel [NHANES]    Frequency of Communication with Friends and Family: Three times a week    Frequency of Social Gatherings with Friends and Family: More than three times a week    Attends Religious Services: Never    Database administrator or Organizations: Not on file    Attends Banker Meetings: Never    Marital Status: Divorced  Catering manager Violence: Not At Risk (09/27/2022)   Humiliation, Afraid, Rape, and Kick questionnaire    Fear of Current or Ex-Partner: No    Emotionally Abused: No    Physically Abused: No    Sexually Abused: No    Review of Systems*** Constitutional: Patient denies any unintentional weight loss or change in strength lntegumentary: Patient denies any rashes or pruritus Eyes: Patient {Actions; denies-reports:120008} dry eyes ENT: Patient {Actions; denies-reports:120008} dry mouth Cardiovascular:  Patient denies chest pain or syncope Respiratory: Patient denies shortness of breath Gastrointestinal: Patient ***denies nausea, vomiting, constipation, or diarrhea Musculoskeletal: Patient denies muscle cramps or weakness Neurologic: Patient denies convulsions or seizures Allergic/Immunologic: Patient denies recent allergic reaction(s) Hematologic/Lymphatic: Patient denies bleeding tendencies Endocrine: Patient denies heat/cold intolerance  GU: As per HPI.  OBJECTIVE There were no vitals filed for this visit. There is no height or weight on file to calculate BMI.  Physical Examination*** Constitutional: No obvious distress; patient is non-toxic appearing  Cardiovascular: No visible lower extremity edema.  Respiratory: The patient does not have audible wheezing/stridor; respirations do not  appear labored  Gastrointestinal: Abdomen non-distended Musculoskeletal: Normal ROM of UEs  Skin: No obvious rashes/open sores  Neurologic: CN 2-12 grossly intact Psychiatric: Answered questions appropriately with normal affect  Hematologic/Lymphatic/Immunologic: No obvious bruises or sites of spontaneous bleeding  UA: ***negative *** WBC/hpf, *** RBC/hpf, bacteria (***) PVR: *** ml  ASSESSMENT No diagnosis found. *** We discussed the symptoms of overactive bladder (OAB), which include urinary urgency, frequency, nocturia, with or without urge incontinence.   While we may not know the exact etiology of OAB, several risk factors can be identified.  - We discussed this patient's neurogenic risk factors for OAB-type symptoms including ***T2DM, ***nicotine use, ***.  - Likely exacerbated by ***diuretic use, ***caffeine intake, ***consumption of bladder irritants such as (acidic foods, spicy foods, alcohol).   We discussed the following management options in detail including potential benefits, risks, and side effects: Behavioral therapy: Modify fluid intake Decreasing bladder irritants (such as caffeine, acidic foods, spicy foods, alcohol) Urge suppression strategies Bladder retraining / timed voiding Double voiding Medication(s): - For anticholinergic medications, we discussed the potential side effects of anticholinergics including dry eyes, dry mouth, constipation, cognitive impairment and urinary retention.  - For beta-3 agonist medication, we discussed the risk for urinary retention and the potential side effect of elevated blood pressure specific to Myrbetriq (which is more likely to occur in individuals with uncontrolled hypertension).  For refractory cases: PTNS (posterior tibial nerve stimulation) Sacral neuromodulation trial (Medtronic lnterStim or Axonics implant) Bladder Botox injections In extreme cases, SP tube placement  ***In order to further evaluate urinary  incontinence, She was instructed to complete a 3-day bladder diary.  ***Discussed recommendation for urodynamic testing, which was described in detail including risks such as bleeding, infection, organ/tissue/nerve damage.  She decided to proceed with *** ***work on behavioral modifications including ***minimizing caffeine intake and working on ***timed voiding.  Will plan for follow up in ***8 weeks / *** months / ***1 year or sooner if needed. Pt verbalized understanding and agreement. All questions were answered.  PLAN Advised the following: *** ***. Minimize caffeine intake. ***. Work on timed voiding. ***. Urge suppression strategies. ***. Double/ triple voiding. ***. No follow-ups on file.  No orders of the defined types were placed in this encounter.   It has been explained that the patient is to follow regularly with their PCP in addition to all other providers involved in their care and to follow instructions provided by these respective offices. Patient advised to contact urology clinic if any urologic-pertaining questions, concerns, new symptoms or problems arise in the interim period.  There are no Patient Instructions on file for this visit.  Electronically signed by:  Donnita Falls, FNP   05/09/23    2:56 PM

## 2023-05-13 ENCOUNTER — Ambulatory Visit: Payer: Medicare HMO | Admitting: Urology

## 2023-05-17 ENCOUNTER — Ambulatory Visit (HOSPITAL_COMMUNITY): Payer: Medicare HMO | Attending: Neurology

## 2023-05-17 ENCOUNTER — Other Ambulatory Visit: Payer: Self-pay

## 2023-05-17 DIAGNOSIS — M25551 Pain in right hip: Secondary | ICD-10-CM | POA: Diagnosis present

## 2023-05-17 DIAGNOSIS — R269 Unspecified abnormalities of gait and mobility: Secondary | ICD-10-CM | POA: Diagnosis not present

## 2023-05-17 DIAGNOSIS — R262 Difficulty in walking, not elsewhere classified: Secondary | ICD-10-CM | POA: Diagnosis present

## 2023-05-17 NOTE — Therapy (Signed)
OUTPATIENT PHYSICAL THERAPY LOWER EXTREMITY EVALUATION   Patient Name: Julie Park MRN: [id number] DOB:[birth year], 89 y.o., female Today's Date: 05/17/2023  END OF SESSION:  PT End of Session - 05/17/23 1559     Visit Number 1    Number of Visits 8    Date for PT Re-Evaluation 06/14/23    Authorization Type [employer] Medicare HMO/PPO    PT Start Time 1015    PT Stop Time 1100    PT Time Calculation (min) 45 min    Equipment Utilized During Treatment Gait belt    Activity Tolerance Patient tolerated treatment well    Behavior During Therapy WFL for tasks assessed/performed            Past Medical History:  Diagnosis Date   Anxiety    Chest pain 02/08/15   ETT normal   Diverticulosis    Diverticulitis (1982)   Fracture of rib of right side 08/16/2013   GERD (gastroesophageal reflux disease)    Hyperlipidemia, mixed    Hypothyroidism    IBS (irritable bowel syndrome)    Morton's neuroma    Right lumbar radiculopathy    Intermittent x 2 episodes in summer 2014   Shingles 1967 and 2010   Ulcer    Urine incontinence    Vertigo    Past Surgical History:  Procedure Laterality Date   CARDIOVASCULAR STRESS TEST  02/08/15   ETT normal   CATARACT EXTRACTION  2012   Dr. Elmer Picker   COLONOSCOPY  2005   [facility] Gwinda Passe at [facility] (normal per pt report)   DEXA  11/03/13   Heel T score -0.8.   FOOT NEUROMA SURGERY  1994   right foot   TONSILLECTOMY     TONSILLECTOMY AND ADENOIDECTOMY  1948   Dr. Linde Gillis [facility]   TUBAL LIGATION  [phone]   WISDOM TOOTH EXTRACTION     Patient Active Problem List   Diagnosis Date Noted   Right hip pain 03/12/2023   Cardiac chest pain 08/20/2022   Lower extremity pain 08/20/2022   Gait abnormality 04/25/2021   Nevus of choroid of right eye 08/24/2019   Posterior vitreous detachment of right eye 08/24/2019   Pseudophakia of both eyes 08/24/2019   GAD (generalized anxiety disorder) 07/11/2018   Overactive bladder 07/11/2018    Functional urinary incontinence 07/10/2018   Hyponatremia 03/31/2014   HLD (hyperlipidemia)    Diverticular disease    Anxiety state    GERD (gastroesophageal reflux disease)    Hypothyroidism    Urine incontinence    IRRITABLE BOWEL SYNDROME 03/02/2010    PCP: Octavia Heir, NP  REFERRING PROVIDER: Levert Feinstein, MD  REFERRING DIAG: [phone] (ICD-10-CM) - Right hip pain R26.9 (ICD-10-CM) - Gait abnormality  THERAPY DIAG:  Pain in right hip  Difficulty in walking, not elsewhere classified  Rationale for Evaluation and Treatment: Rehabilitation  ONSET DATE: 2020-2021  SUBJECTIVE:   SUBJECTIVE STATEMENT: EVAL: Arrives to the clinic with R hip pain. Also reports of having low back pain (see below). However, denies having pain at the time of evaluation. Patient states that the R leg may feel like it's going to give way. Patient states that there at times when her legs will feel numb especially at night when her ankles are swollen. Because of this, patient has been having difficulty working around the house. Patient is also afraid of falling. Condition started around 2020-2021 when she fell and broke her back. Patient went to the ER but was  not admitted to the hospital. Patient was not given any back brace as well. Since then, she has been having low back pain and issues with bladder incontinence to which her MD is aware of. Patient is currently on pain meds for it which helps. Patient's condition is getting worse which prompted her to seek MD consultation. Imaging was done (see below). Patient was then sent to outpatient PT evaluation and management.  PERTINENT HISTORY: Hx of lumbar fx, osteoporosis, bladder incontinence PAIN:  Are you having pain? Yes: NPRS scale: 8/10 Pain location: low back, waist and groin Pain description: deep ache, intermittent Aggravating factors: stand > 5-10 min Relieving factors: laying on her back  PRECAUTIONS: Fall  RED FLAGS: Bowel or bladder  incontinence: Yes: MD is aware, Spinal tumors: No, and Compression fracture: Yes: 2020-2021    WEIGHT BEARING RESTRICTIONS: No  FALLS:  Has patient fallen in last 6 months? No  LIVING ENVIRONMENT: Lives with: lives with their family Lives in: House/apartment Stairs: Yes: Internal: 12-14 steps; on right going up and on left going up and External: 0 steps; none Has following equipment at home: Chiropractor, Environmental consultant - 4 wheeled, Marine scientist  OCCUPATION: retired  PLOF: Independent and Independent with basic ADLs  PATIENT GOALS: "to try some relief"  NEXT MD VISIT: In another year  OBJECTIVE:  Note: Objective measures were completed at Evaluation unless otherwise noted.  DIAGNOSTIC FINDINGS:  03/25/23 DG HIP (WITH OR WITHOUT PELVIS) 2-3V RIGHT   COMPARISON:  None Available.   FINDINGS: There is no evidence of hip fracture or dislocation. Moderate osteophyte formation of right hip is noted.   IMPRESSION: Moderate degenerative joint disease of right hip. No acute abnormality seen.  PATIENT SURVEYS:  ABC scale 860/1600 = 53.8%  COGNITION: Overall cognitive status: Within functional limits for tasks assessed     SENSATION: WFL  MUSCLE LENGTH: Hamstrings: mild restriction on B Gastrocnemius: moderate restriction on B  POSTURE: rounded shoulders, forward head, and flexed trunk (standing)   LOWER EXTREMITY ROM: B hip flex/abd, B knee flex/ext, B ankle DF/PF are [facility] when done actively   LOWER EXTREMITY MMT:  MMT Right eval Left eval  Hip flexion 4- 4  Hip extension    Hip abduction 4- 4  Hip adduction    Hip internal rotation    Hip external rotation    Knee flexion 4 4  Knee extension 4- 4  Ankle dorsiflexion 4 4  Ankle plantarflexion 4 4  Ankle inversion    Ankle eversion     (Blank rows = not tested)  FUNCTIONAL TESTS:  2 minute walk test: 287 ft with R quad cane 30 sec chair stand test: 7x  GAIT: Distance walked: 287 ft Assistive  device utilized: Quad cane small base, needs to hold on to the walls at times instead of using the quad cane Level of assistance: Modified independence Comments: done during , mildly unsteady, slightly wide BOS, decreased hip flexion on initial contanct   TODAY'S TREATMENT:  DATE:  05/17/23 Evaluation and patient education done    PATIENT EDUCATION:  Education details: Educated on the pathoanatomy of hip pain and gait dysfunction. Educated on the goals and course of rehab. Educated on measures to reduce risk of falls at home. Person educated: Patient Education method: Explanation Education comprehension: verbalized understanding  HOME EXERCISE PROGRAM: None provided to date.  ASSESSMENT:  CLINICAL IMPRESSION: EVAL: Patient is a 89 y.o. female who was seen today for physical therapy evaluation and treatment for hip pain and gait abnormality. Patient's condition is further defined by difficulty with walking and standing due to impaired balance/proprioception, pain, weakness, and decreased soft tissue extensibility. Skilled PT is required to address the impairments and functional limitations listed below. Adjusted cane and advised to use the cane on the L as the  R LE appears to be the weaker side. Patient was a little skeptical about the use of the cane on the L as she has been using it on the R. However, patient demonstrated less unsteadiness when walking with the cane on the L.  OBJECTIVE IMPAIRMENTS: Abnormal gait, decreased activity tolerance, decreased balance, difficulty walking, decreased strength, and impaired flexibility.   ACTIVITY LIMITATIONS: carrying, lifting, bending, standing, squatting, stairs, and transfers  PARTICIPATION LIMITATIONS: meal prep, cleaning, laundry, shopping, community activity, and yard work  PERSONAL FACTORS: Age and 1-2  comorbidities: Hx of lumbar fx, osteoporosis  are also affecting patient's functional outcome.   REHAB POTENTIAL: Fair    CLINICAL DECISION MAKING: Evolving/moderate complexity  EVALUATION COMPLEXITY: Moderate   GOALS: Goals reviewed with patient? Yes  SHORT TERM GOALS: Target date: 05/31/23 Pt will demonstrate indep in HEP to facilitate carry-over of skilled services and improve functional outcomes Goal status: INITIAL  LONG TERM GOALS: Target date: 06/14/23  Pt will improve ABC scale score to 68% in order to demonstrate improved confidence with balance and safety during ambulation and other ADLs  Baseline: 53.8% Goal status: INITIAL  2. Pt will increase by at least 40 ft in order to demonstrate clinically significant improvement in community ambulation Baseline: 287 ft Goal status: INITIAL  3.  Pt will demonstrate increase in LE strength to 4/5 to facilitate ease and safety in ambulation  Baseline: 4-/5 Goal status: INITIAL  4.  Pt will be able to stand > 10 min with slight to mild pain on the R hip (3-4/10 pain) to facilitate ease in ADLs Baseline: 8/10 pain Goal status: INITIAL  PLAN:  PT FREQUENCY: 2x/week  PT DURATION: 4 weeks  PLANNED INTERVENTIONS: 97164- PT Re-evaluation, 97110-Therapeutic exercises, 97530- Therapeutic activity, 97112- Neuromuscular re-education, 97535- Self Care, 86578- Manual therapy, 46962- Gait training, Patient/Family education, Cryotherapy, and Moist heat  PLAN FOR NEXT SESSION: Provide HEP. Begin LE strengthening, flexibility, balance, and gait training activities. May also include core stabilization.   Tish Frederickson. Alveda Vanhorne, PT, DPT, OCS Board-Certified Clinical Specialist in Orthopedic PT PT Compact Privilege # ([place]): [id number]  05/17/2023, 4:05 PM

## 2023-05-21 ENCOUNTER — Ambulatory Visit (HOSPITAL_COMMUNITY): Payer: Medicare HMO | Admitting: Physical Therapy

## 2023-05-21 DIAGNOSIS — M25551 Pain in right hip: Secondary | ICD-10-CM

## 2023-05-21 DIAGNOSIS — R262 Difficulty in walking, not elsewhere classified: Secondary | ICD-10-CM

## 2023-05-21 NOTE — Therapy (Signed)
OUTPATIENT PHYSICAL THERAPY TREATMENT   Patient Name: Julie Park MRN: [id number] DOB:09/06/1933, 89 y.o., female Today's Date: 05/21/2023  END OF SESSION:  PT End of Session - 05/21/23 1426     Visit Number 2    Number of Visits 8    Date for PT Re-Evaluation 06/14/23    Authorization Type [employer] HMO/PPO    PT Start Time 1432    PT Stop Time 1515    PT Time Calculation (min) 43 min    Equipment Utilized During Treatment Gait belt    Activity Tolerance Patient tolerated treatment well    Behavior During Therapy WFL for tasks assessed/performed            Past Medical History:  Diagnosis Date   Anxiety    Chest pain 02/08/15   ETT normal   Diverticulosis    Diverticulitis (1982)   Fracture of rib of right side 08/16/2013   GERD (gastroesophageal reflux disease)    Hyperlipidemia, mixed    Hypothyroidism    IBS (irritable bowel syndrome)    Morton's neuroma    Right lumbar radiculopathy    Intermittent x 2 episodes in summer 2014   Shingles 1967 and 2010   Ulcer    Urine incontinence    Vertigo    Past Surgical History:  Procedure Laterality Date   CARDIOVASCULAR STRESS TEST  02/08/15   ETT normal   CATARACT EXTRACTION  2012   Dr. Elmer Picker   COLONOSCOPY  2005   Dr. Gwinda Passe at [facility] (normal per pt report)   DEXA  11/03/13   Heel T score -0.8.   FOOT NEUROMA SURGERY  1994   right foot   TONSILLECTOMY     TONSILLECTOMY AND ADENOIDECTOMY  1948   Dr. Linde Gillis [facility]   TUBAL LIGATION  [phone]   WISDOM TOOTH EXTRACTION     Patient Active Problem List   Diagnosis Date Noted   Right hip pain 03/12/2023   Cardiac chest pain 08/20/2022   Lower extremity pain 08/20/2022   Gait abnormality 04/25/2021   Nevus of choroid of right eye 08/24/2019   Posterior vitreous detachment of right eye 08/24/2019   Pseudophakia of both eyes 08/24/2019   GAD (generalized anxiety disorder) 07/11/2018   Overactive bladder 07/11/2018   Functional urinary  incontinence 07/10/2018   Hyponatremia 03/31/2014   HLD (hyperlipidemia)    Diverticular disease    Anxiety state    GERD (gastroesophageal reflux disease)    Hypothyroidism    Urine incontinence    IRRITABLE BOWEL SYNDROME 03/02/2010    PCP: Octavia Heir, NP  REFERRING PROVIDER: Levert Feinstein, MD  REFERRING DIAG: [phone] (ICD-10-CM) - Right hip pain R26.9 (ICD-10-CM) - Gait abnormality  THERAPY DIAG:  Pain in right hip  Difficulty in walking, not elsewhere classified  Rationale for Evaluation and Treatment: Rehabilitation  ONSET DATE: 2020-2021  SUBJECTIVE:   SUBJECTIVE STATEMENT: Pt comes today stating she is trying to use the QC on other side as instructed last visit, however she's used it this way for 4 years and she forgets.  Reports no pain or issues today.    EVAL: Arrives to the clinic with R hip pain. Also reports of having low back pain (see below). However, denies having pain at the time of evaluation. Patient states that the R leg may feel like it's going to give way. Patient states that there at times when her legs will feel numb especially at night when her ankles  are swollen. Because of this, patient has been having difficulty working around the house. Patient is also afraid of falling. Condition started around 2020-2021 when she fell and broke her back. Patient went to the ER but was not admitted to the hospital. Patient was not given any back brace as well. Since then, she has been having low back pain and issues with bladder incontinence to which her MD is aware of. Patient is currently on pain meds for it which helps. Patient's condition is getting worse which prompted her to seek MD consultation. Imaging was done (see below). Patient was then sent to outpatient PT evaluation and management.  PERTINENT HISTORY: Hx of lumbar fx, osteoporosis, bladder incontinence PAIN:  Are you having pain? Yes: NPRS scale: 0/10 Pain location: low back, waist and groin Pain  description: deep ache, intermittent Aggravating factors: stand > 5-10 min Relieving factors: laying on her back  PRECAUTIONS: Fall  RED FLAGS: Bowel or bladder incontinence: Yes: MD is aware, Spinal tumors: No, and Compression fracture: Yes: 2020-2021    WEIGHT BEARING RESTRICTIONS: No  FALLS:  Has patient fallen in last 6 months? No  LIVING ENVIRONMENT: Lives with: lives with their family Lives in: House/apartment Stairs: Yes: Internal: 12-14 steps; on right going up and on left going up and External: 0 steps; none Has following equipment at home: Chiropractor, Environmental consultant - 4 wheeled, Marine scientist  OCCUPATION: retired  PLOF: Independent and Independent with basic ADLs  PATIENT GOALS: "to try some relief"  NEXT MD VISIT: In another year  OBJECTIVE:  Note: Objective measures were completed at Evaluation unless otherwise noted.  DIAGNOSTIC FINDINGS:  03/25/23 DG HIP (WITH OR WITHOUT PELVIS) 2-3V RIGHT   COMPARISON:  None Available.   FINDINGS: There is no evidence of hip fracture or dislocation. Moderate osteophyte formation of right hip is noted.   IMPRESSION: Moderate degenerative joint disease of right hip. No acute abnormality seen.  PATIENT SURVEYS:  ABC scale 860/1600 = 53.8%  COGNITION: Overall cognitive status: Within functional limits for tasks assessed     SENSATION: WFL  MUSCLE LENGTH: Hamstrings: mild restriction on B Gastrocnemius: moderate restriction on B  POSTURE: rounded shoulders, forward head, and flexed trunk (standing)   LOWER EXTREMITY ROM: B hip flex/abd, B knee flex/ext, B ankle DF/PF are [facility] when done actively   LOWER EXTREMITY MMT:  MMT Right eval Left eval  Hip flexion 4- 4  Hip extension    Hip abduction 4- 4  Hip adduction    Hip internal rotation    Hip external rotation    Knee flexion 4 4  Knee extension 4- 4  Ankle dorsiflexion 4 4  Ankle plantarflexion 4 4  Ankle inversion    Ankle eversion      (Blank rows = not tested)  FUNCTIONAL TESTS:  2 minute walk test: 287 ft with R quad cane 30 sec chair stand test: 7x  GAIT: Distance walked: 287 ft Assistive device utilized: Quad cane small base, needs to hold on to the walls at times instead of using the quad cane Level of assistance: Modified independence Comments: done during , mildly unsteady, slightly wide BOS, decreased hip flexion on initial contanct   TODAY'S TREATMENT:  DATE:  05/21/23 Goal review Nustep seat 7 LE level 3 5 minutes Standing lumbar extension 10X Sit to stands 5X no UE Seated long arc quad 10X5" each Supine:  abdominal isometrics 10X5" holds  Bridge 10X  Lower trunk rotations 10X5" each direction Gait with SPC Logroll technique    05/17/23 Evaluation and patient education done    PATIENT EDUCATION:  Education details: Educated on the pathoanatomy of hip pain and gait dysfunction. Educated on the goals and course of rehab. Educated on measures to reduce risk of falls at home. Person educated: Patient Education method: Explanation Education comprehension: verbalized understanding  HOME EXERCISE PROGRAM: None provided at evaluation  Access Code: [email or web address] URL: [email or web address] Date: 05/21/2023 Prepared by: Emeline Gins  Exercises - Sit to Stand  - 2 x daily - 2 sets - 5 reps - Seated Long Arc Quad  - 2 x daily - 2 sets - 10 reps - 3 sec hold - Seated March  - 2 x daily - 2 sets - 10 reps - Supine Transversus Abdominis Bracing - Hands on Ground  - 2 x daily - 2 sets - 10 reps - 5 sec hold - Beginner Bridge  - 2 x daily - 2 sets - 10 reps - 5 sec hold - Supine Lower Trunk Rotation  - 2 x daily - 2 sets - 10 reps - 5 sec hold - Standing Lumbar Extension  - 2 x daily - 2 sets - 10 reps  ASSESSMENT:  CLINICAL IMPRESSION: Reviewed goals and POC  moving forward.  Began therex according to deficits from evaluation and given written home instructions.  Core and LE strengthening began with cues for form and hold times.   Instructed with logroll technique which pt demonstrated and reported she is already doing this at home.  Pt without complaints of pain or any issues during or at completion of therapy.  Progress to standing strengthening/stability.   Suggested using SPC rather than quad cane due to fall risk from forgetting and using on other side.  PT exhibited safety and stability using SPC in dept today.  PT will continue to benefit from skilled therapy to address deficits.   OBJECTIVE IMPAIRMENTS: Abnormal gait, decreased activity tolerance, decreased balance, difficulty walking, decreased strength, and impaired flexibility.   ACTIVITY LIMITATIONS: carrying, lifting, bending, standing, squatting, stairs, and transfers  PARTICIPATION LIMITATIONS: meal prep, cleaning, laundry, shopping, community activity, and yard work  PERSONAL FACTORS: Age and 1-2 comorbidities: Hx of lumbar fx, osteoporosis  are also affecting patient's functional outcome.   REHAB POTENTIAL: Fair    CLINICAL DECISION MAKING: Evolving/moderate complexity  EVALUATION COMPLEXITY: Moderate   GOALS: Goals reviewed with patient? Yes  SHORT TERM GOALS: Target date: 05/31/23 Pt will demonstrate indep in HEP to facilitate carry-over of skilled services and improve functional outcomes Goal status: INITIAL  LONG TERM GOALS: Target date: 06/14/23  Pt will improve ABC scale score to 68% in order to demonstrate improved confidence with balance and safety during ambulation and other ADLs  Baseline: 53.8% Goal status: INITIAL  2. Pt will increase by at least 40 ft in order to demonstrate clinically significant improvement in community ambulation Baseline: 287 ft Goal status: INITIAL  3.  Pt will demonstrate increase in LE strength to 4/5 to facilitate ease and safety  in ambulation  Baseline: 4-/5 Goal status: INITIAL  4.  Pt will be able to stand > 10 min with slight to mild pain on the R hip (3-4/10 pain) to facilitate  ease in ADLs Baseline: 8/10 pain Goal status: INITIAL  PLAN:  PT FREQUENCY: 2x/week  PT DURATION: 4 weeks  PLANNED INTERVENTIONS: 97164- PT Re-evaluation, 97110-Therapeutic exercises, 97530- Therapeutic activity, 97112- Neuromuscular re-education, 97535- Self Care, 86578- Manual therapy, 97116- Gait training, Patient/Family education, Cryotherapy, and Moist heat  PLAN FOR NEXT SESSION: progress LE strengthening, flexibility, balance, and gait training activities. May also include core stabilization.   Lurena Nida, PTA/CLT [facility] Ph: [phone]   05/21/2023, 2:27 PM

## 2023-05-27 ENCOUNTER — Other Ambulatory Visit: Payer: Self-pay | Admitting: Orthopedic Surgery

## 2023-05-27 DIAGNOSIS — F419 Anxiety disorder, unspecified: Secondary | ICD-10-CM

## 2023-05-28 NOTE — Telephone Encounter (Signed)
Patient is requesting a refill of the following medications: Requested Prescriptions   Pending Prescriptions Disp Refills   LORazepam (ATIVAN) 1 MG tablet [Pharmacy Med Name: LORAZEPAM 1 MG TABLET] 90 tablet     Sig: TAKE 1 TABLET (1 MG TOTAL) BY MOUTH EVERY 8 (EIGHT) HOURS AS NEEDED. FOR ANXIETY    Date of last approval: 12/04/2022  Refill amount: 90/5 refills   Treatment agreement date: 07/19/2022, notation made to update on pending appointment in Feb 2025.

## 2023-06-23 ENCOUNTER — Other Ambulatory Visit: Payer: Self-pay | Admitting: Orthopedic Surgery

## 2023-06-23 DIAGNOSIS — G8929 Other chronic pain: Secondary | ICD-10-CM

## 2023-06-23 DIAGNOSIS — F419 Anxiety disorder, unspecified: Secondary | ICD-10-CM

## 2023-06-24 NOTE — Telephone Encounter (Signed)
Patient is requesting a refill of the following medications: Requested Prescriptions   Pending Prescriptions Disp Refills   LORazepam (ATIVAN) 1 MG tablet [Pharmacy Med Name: LORAZEPAM 1 MG TABLET] 90 tablet 0    Sig: TAKE 1 TABLET (1 MG TOTAL) BY MOUTH EVERY 8 (EIGHT) HOURS AS NEEDED. FOR ANXIETY   traMADol (ULTRAM) 50 MG tablet [Pharmacy Med Name: TRAMADOL HCL 50 MG TABLET] 15 tablet 3    Sig: TAKE 1/2 TABLET BY MOUTH DAILY    Date of last refill: 03/01/23 and 05/28/23  Refill amount: 3 and 0  Treatment agreement date:  07/18/22

## 2023-07-18 ENCOUNTER — Other Ambulatory Visit: Payer: Self-pay | Admitting: *Deleted

## 2023-07-18 DIAGNOSIS — M810 Age-related osteoporosis without current pathological fracture: Secondary | ICD-10-CM

## 2023-07-18 MED ORDER — DENOSUMAB 60 MG/ML ~~LOC~~ SOSY
60.0000 mg | PREFILLED_SYRINGE | Freq: Once | SUBCUTANEOUS | Status: AC
  Administered 2023-08-22: 60 mg via SUBCUTANEOUS

## 2023-07-23 ENCOUNTER — Other Ambulatory Visit: Payer: Self-pay | Admitting: Nurse Practitioner

## 2023-07-23 DIAGNOSIS — F419 Anxiety disorder, unspecified: Secondary | ICD-10-CM

## 2023-07-24 ENCOUNTER — Telehealth: Payer: Self-pay | Admitting: *Deleted

## 2023-07-24 NOTE — Telephone Encounter (Signed)
Received Verification back from [employer] and it stated that patient owes a 20% copay for Prolia.  11/2022 she only owed a $20 copay (admin fee) with the same insurance.   Called [employer] [phone] and spoke with Mercie Eon and she stated that she will have a live person run the Verification.

## 2023-07-24 NOTE — Telephone Encounter (Signed)
Patient is requesting a refill of the following medications: Requested Prescriptions   Pending Prescriptions Disp Refills   LORazepam (ATIVAN) 1 MG tablet [Pharmacy Med Name: LORAZEPAM 1 MG TABLET] 90 tablet 0    Sig: TAKE 1 TABLET (1 MG TOTAL) BY MOUTH EVERY 8 (EIGHT) HOURS AS NEEDED. FOR ANXIETY    Date of last refill:06/24/2023 Refill amount: 90 tablets  Treatment agreement date:   07/18/2022

## 2023-07-30 ENCOUNTER — Telehealth: Payer: Self-pay | Admitting: *Deleted

## 2023-07-30 NOTE — Telephone Encounter (Signed)
Patient has an upcoming appointment for Prolia on 08/22/2023 same day appointment with you.   I scheduled a Lab appointment for labs prior to appointment due to the Prolia injection for 08/19/23  Please place Orders.

## 2023-07-31 ENCOUNTER — Other Ambulatory Visit: Payer: Self-pay | Admitting: Orthopedic Surgery

## 2023-07-31 DIAGNOSIS — M810 Age-related osteoporosis without current pathological fracture: Secondary | ICD-10-CM

## 2023-07-31 NOTE — Telephone Encounter (Signed)
 Orders placed.

## 2023-08-01 ENCOUNTER — Ambulatory Visit: Payer: Medicare HMO | Admitting: Orthopedic Surgery

## 2023-08-10 ENCOUNTER — Other Ambulatory Visit: Payer: Self-pay | Admitting: Orthopedic Surgery

## 2023-08-19 ENCOUNTER — Other Ambulatory Visit: Payer: Medicare HMO

## 2023-08-19 DIAGNOSIS — M810 Age-related osteoporosis without current pathological fracture: Secondary | ICD-10-CM

## 2023-08-20 ENCOUNTER — Encounter: Payer: Self-pay | Admitting: Orthopedic Surgery

## 2023-08-20 LAB — COMPREHENSIVE METABOLIC PANEL
AG Ratio: 1.5 (calc) (ref 1.0–2.5)
ALT: 12 U/L (ref 6–29)
AST: 15 U/L (ref 10–35)
Albumin: 4 g/dL (ref 3.6–5.1)
Alkaline phosphatase (APISO): 68 U/L (ref 37–153)
BUN/Creatinine Ratio: 19 (calc) (ref 6–22)
BUN: 20 mg/dL (ref 7–25)
CO2: 27 mmol/L (ref 20–32)
Calcium: 9.5 mg/dL (ref 8.6–10.4)
Chloride: 103 mmol/L (ref 98–110)
Creat: 1.06 mg/dL — ABNORMAL HIGH (ref 0.60–0.95)
Globulin: 2.7 g/dL (ref 1.9–3.7)
Glucose, Bld: 105 mg/dL — ABNORMAL HIGH (ref 65–99)
Potassium: 4.8 mmol/L (ref 3.5–5.3)
Sodium: 137 mmol/L (ref 135–146)
Total Bilirubin: 0.6 mg/dL (ref 0.2–1.2)
Total Protein: 6.7 g/dL (ref 6.1–8.1)

## 2023-08-22 ENCOUNTER — Other Ambulatory Visit: Payer: Self-pay | Admitting: Orthopedic Surgery

## 2023-08-22 ENCOUNTER — Encounter: Payer: Self-pay | Admitting: Orthopedic Surgery

## 2023-08-22 ENCOUNTER — Ambulatory Visit (INDEPENDENT_AMBULATORY_CARE_PROVIDER_SITE_OTHER): Payer: Medicare HMO | Admitting: Orthopedic Surgery

## 2023-08-22 VITALS — BP 124/62 | HR 60 | Temp 97.3°F | Resp 16 | Ht 65.5 in | Wt 181.4 lb

## 2023-08-22 DIAGNOSIS — M5441 Lumbago with sciatica, right side: Secondary | ICD-10-CM

## 2023-08-22 DIAGNOSIS — M810 Age-related osteoporosis without current pathological fracture: Secondary | ICD-10-CM

## 2023-08-22 DIAGNOSIS — F419 Anxiety disorder, unspecified: Secondary | ICD-10-CM

## 2023-08-22 DIAGNOSIS — I10 Essential (primary) hypertension: Secondary | ICD-10-CM | POA: Diagnosis not present

## 2023-08-22 DIAGNOSIS — E782 Mixed hyperlipidemia: Secondary | ICD-10-CM | POA: Diagnosis not present

## 2023-08-22 DIAGNOSIS — K219 Gastro-esophageal reflux disease without esophagitis: Secondary | ICD-10-CM | POA: Diagnosis not present

## 2023-08-22 DIAGNOSIS — E039 Hypothyroidism, unspecified: Secondary | ICD-10-CM

## 2023-08-22 DIAGNOSIS — G8929 Other chronic pain: Secondary | ICD-10-CM

## 2023-08-22 DIAGNOSIS — M5442 Lumbago with sciatica, left side: Secondary | ICD-10-CM

## 2023-08-22 DIAGNOSIS — R0981 Nasal congestion: Secondary | ICD-10-CM

## 2023-08-22 DIAGNOSIS — R635 Abnormal weight gain: Secondary | ICD-10-CM

## 2023-08-22 DIAGNOSIS — R0602 Shortness of breath: Secondary | ICD-10-CM

## 2023-08-22 MED ORDER — LORAZEPAM 1 MG PO TABS: 1.0000 mg | ORAL_TABLET | Freq: Three times a day (TID) | ORAL | 0 refills | Status: DC | PRN

## 2023-08-22 MED ORDER — DENOSUMAB 60 MG/ML ~~LOC~~ SOSY: 60.0000 mg | PREFILLED_SYRINGE | SUBCUTANEOUS | Status: AC

## 2023-08-22 NOTE — Progress Notes (Signed)
 Careteam: Patient Care Team: Julie Heir, NP as PCP - General (Adult Health Nurse Practitioner) Julie Bicker, MD as PCP - Cardiology (Cardiology) Julie Flow, MD as Consulting Physician (Ophthalmology) Julie Repress, MD as Referring Physician (Ophthalmology) Julie Hearing, MD as Consulting Physician (Orthopedic Surgery)  Seen by: Julie Park, AGNP-C  PLACE OF SERVICE:  [facility]  Advanced Directive information Does Patient Have a Medical Advance Directive?: Yes, Type of Advance Directive: Living will;Healthcare Power of [place];Out of facility DNR (pink MOST or yellow form), Does patient want to make changes to medical advance directive?: No - Patient declined  No Known Allergies  Chief Complaint  Patient presents with   Medical Management of Chronic Issues    6 month follow up.   Form Completion    Sign treatment agreement    Injection     Prolia injection       HPI: Patient is a 89 y.o. female seen today for medical management of chronic conditions.   Discussed the use of AI scribe software for clinical note transcription with the patient, who gave verbal consent to proceed.  She is accompanied by her daughter.  She experiences shortness of breath upon exertion, a new symptom for her, becoming winded after climbing a single flight of stairs and often needing to stop halfway to catch her breath. There is no shortness of breath at rest. She had an EKG about a year ago, which showed sinus tachycardia with some PACs, but no recent cardiac evaluations have been done.  She reports chronic back pain, which worsens when she is on her feet for more than five minutes, necessitating frequent breaks. The pain is primarily located in her lower back and sometimes. She is currently taking tramadol, half a tablet twice a day, gabapentin three times a day, and seldom uses Tylenol.  Her daughter expresses concern about her lack of a consistent eating and medication  schedule, noting that she often delays meals and medication intake due to distractions. She manages her own medications but sometimes relies on her daughter for assistance. She is aware of her weight gain, which has recently exceeded 180 pounds, and attributes it to decreased mobility and back pain. She is trying to manage her weight through calorie counting. Admits to diet with carbs and sugars.    Prolia injection administered today. Recent calcium 9.5, BUN/creat 19/1.06. Last DEXA 11/2022, t score -3.1 in left FA.        Review of Systems:  Review of Systems  Constitutional: Negative.   HENT: Negative.    Respiratory:  Positive for shortness of breath.   Cardiovascular:  Negative for chest pain and leg swelling.  Gastrointestinal:  Negative for abdominal pain and constipation.  Genitourinary:  Negative for hematuria.  Musculoskeletal:  Positive for back pain and joint pain. Negative for falls.  Skin: Negative.   Neurological:  Negative for dizziness and headaches.  Psychiatric/Behavioral:  Negative for depression and memory loss. The patient is not nervous/anxious and does not have insomnia.     Past Medical History:  Diagnosis Date   Anxiety    Chest pain 02/08/15   ETT normal   Diverticulosis    Diverticulitis (1982)   Fracture of rib of right side 08/16/2013   GERD (gastroesophageal reflux disease)    Hyperlipidemia, mixed    Hypothyroidism    IBS (irritable bowel syndrome)    Morton's neuroma    Right lumbar radiculopathy    Intermittent x 2 episodes in  summer 2014   Shingles 1967 and 2010   Ulcer    Urine incontinence    Vertigo    Past Surgical History:  Procedure Laterality Date   CARDIOVASCULAR STRESS TEST  02/08/15   ETT normal   CATARACT EXTRACTION  2012   Dr. Elmer Picker   COLONOSCOPY  2005   [facility]. [facility]winda Passe at [facility] (normal per pt report)   DEXA  11/03/13   Heel T score -0.8.   FOOT NEUROMA SURGERY  1994   right foot   TONSILLECTOMY     TONSILLECTOMY AND  ADENOIDECTOMY  1948   Dr. Linde Gillis [facility]   TUBAL LIGATION  1974   WISDOM TOOTH EXTRACTION     Social History:   reports that she has quit smoking. Her smoking use included cigarettes. She started smoking about 40 years ago. She has a 40.2 pack-year smoking history. She has never used smokeless tobacco. She reports that she does not drink alcohol and does not use drugs.  Family History  Problem Relation Age of Onset   Tuberculosis Mother    Pancreatic cancer Father    Asthma Daughter    Heart attack Paternal Grandfather     Medications: Patient's Medications  New Prescriptions   No medications on file  Previous Medications   ACETAMINOPHEN (TYLENOL) 325 MG TABLET    Take 2 tablets (650 mg total) by mouth every 6 (six) hours as needed.   ASPIRIN 81 MG TABLET    Take 81 mg by mouth every morning.    ATORVASTATIN (LIPITOR) 10 MG TABLET    TAKE 1 TABLET DAILY   FIBER POWD    Take by mouth.   FLUTICASONE (FLONASE) 50 MCG/ACT NASAL SPRAY    SPRAY 2 SPRAYS INTO EACH NOSTRIL EVERY DAY   GABAPENTIN (NEURONTIN) 300 MG CAPSULE    Take 2 capsules (600 mg total) by mouth 3 (three) times daily.   LEVOTHYROXINE (SYNTHROID) 100 MCG TABLET    TAKE 1 TABLET BY MOUTH EVERY DAY   LORATADINE (CLARITIN) 10 MG TABLET    TAKE 1 TABLET BY MOUTH EVERY DAY   LORAZEPAM (ATIVAN) 1 MG TABLET    TAKE 1 TABLET (1 MG TOTAL) BY MOUTH EVERY 8 (EIGHT) HOURS AS NEEDED. FOR ANXIETY   MAGNESIUM 250 MG TABS    Take 2 tablets by mouth daily.    METOPROLOL TARTRATE (LOPRESSOR) 25 MG TABLET    TAKE 1 TABLET BY MOUTH TWICE A DAY   NITROGLYCERIN (NITROSTAT) 0.4 MG SL TABLET    Place 1 tablet (0.4 mg total) under the tongue every 5 (five) minutes x 3 doses as needed for chest pain (if no relief after 3rd dose, proceed to ED or call 911).   NYSTATIN CREAM (MYCOSTATIN)    APPLY 1 APPLICATION TOPICALLY AS NEEDED FOR DRY SKIN.   OMEPRAZOLE (PRILOSEC) 40 MG CAPSULE    TAKE 1 CAPSULE EVERY DAY   PROPYLENE GLYCOL (SYSTANE  BALANCE) 0.6 % SOLN    Apply to eye daily as needed.   SODIUM CHLORIDE (OCEAN) 0.65 % NASAL SPRAY    Place 1 spray into the nose as needed for congestion.   TRAMADOL (ULTRAM) 50 MG TABLET    TAKE 1/2 TABLET BY MOUTH DAILY  Modified Medications   No medications on file  Discontinued Medications   CELECOXIB (CELEBREX) 50 MG CAPSULE    Take 1 capsule (50 mg total) by mouth 2 (two) times daily as needed for pain.   DENOSUMAB (PROLIA) 60 MG/ML  SOSY INJECTION    Inject 60 mg into the skin every 6 (six) months.   MIRABEGRON ER (MYRBETRIQ) 50 MG TB24 TABLET    Take 1 tablet (50 mg total) by mouth daily.    Physical Exam:  Vitals:   08/22/23 1312  BP: 124/62  Pulse: 60  Resp: 16  Temp: (!) 97.3 F (36.3 C)  SpO2: 96%  Weight: 181 lb 6.4 oz (82.3 kg)  Height: 5' 5.5" (1.664 m)   Body mass index is 29.73 kg/m. Wt Readings from Last 3 Encounters:  08/22/23 181 lb 6.4 oz (82.3 kg)  03/12/23 178 lb 8 oz (81 kg)  02/06/23 173 lb (78.5 kg)    Physical Exam Vitals reviewed.  Constitutional:      Appearance: She is obese.  HENT:     Head: Normocephalic.  Eyes:     General:        Right eye: No discharge.        Left eye: No discharge.  Neck:     Thyroid: No thyroid mass or thyromegaly.  Cardiovascular:     Rate and Rhythm: Normal rate and regular rhythm.     Pulses: Normal pulses.     Heart sounds: Normal heart sounds.  Pulmonary:     Effort: Pulmonary effort is normal.     Breath sounds: Normal breath sounds.  Abdominal:     General: Bowel sounds are normal.     Palpations: Abdomen is soft.  Musculoskeletal:     Cervical back: Neck supple.     Right lower leg: No edema.     Left lower leg: No edema.  Lymphadenopathy:     Cervical: No cervical adenopathy.  Skin:    General: Skin is warm.     Capillary Refill: Capillary refill takes less than 2 seconds.  Neurological:     General: No focal deficit present.     Mental Status: She is alert and oriented to person, place,  and time.  Psychiatric:        Mood and Affect: Mood normal.     Labs reviewed: Basic Metabolic Panel: Recent Labs    02/06/23 0934 08/19/23 0916  NA 134* 137  K 4.8 4.8  CL 100 103  CO2 26 27  GLUCOSE 112* 105*  BUN 17 20  CREATININE 0.94 1.06*  CALCIUM 9.5 9.5  TSH 1.15  --    Liver Function Tests: Recent Labs    02/06/23 0934 08/19/23 0916  AST 16 15  ALT 12 12  BILITOT 0.5 0.6  PROT 7.1 6.7   No results for input(s): "LIPASE", "AMYLASE" in the last 8760 hours. No results for input(s): "AMMONIA" in the last 8760 hours. CBC: Recent Labs    02/06/23 0934  WBC 5.4  NEUTROABS 3,434  HGB 15.0  HCT 46.8*  MCV 89.3  PLT 236   Lipid Panel: Recent Labs    02/06/23 0934  CHOL 155  HDL 62  LDLCALC 74  TRIG 107  CHOLHDL 2.5   TSH: Recent Labs    02/06/23 0934  TSH 1.15   A1C: Lab Results  Component Value Date   HGBA1C 5.6 08/15/2020     Assessment/Plan 1. Primary hypertension (Primary) - controlled - BUN/creat 19/1.06 - cont metoprolol  2. Mixed hyperlipidemia - total 155. LDL 74 01/2023 - cont atorvastatin  3. Senile osteoporosis - Prolia injection administered today -  calcium 9.5, BUN/creat 19/1.06 - DEXA 11/2022, t score -3.1 in left FA - repeat Prolia injection  in 6 months   4. Gastroesophageal reflux disease without esophagitis - hgb stable - cont omeprazole  5. Chronic midline low back pain with bilateral sciatica - stable with tramadol and gabapentin  6. Anxiety - no recent panic - controlled substance contract signed today - cont Ativan prn - LORazepam (ATIVAN) 1 MG tablet; Take 1 tablet (1 mg total) by mouth every 8 (eight) hours as needed. for anxiety  Dispense: 90 tablet; Refill: 0  7. Shortness of breath on exertion - suspect due to deconditioning - occurs when using stairs - EKG ST with PAC's - recommend repeat EKG if symptoms occur  8. Weight gain - BMI 29.73 - sedentary lifestyle - h/o hypothyroidism> on  levothyroxine - discussed reducing sweets and counting calories  9. Acquired hypothyroidism - TSH stable - cont levothyroxine  Total time: 37 minutes. Greater than 50% of total time spent doing patient education regarding health maintenance, osteoporosis, DOE, HTN, HLD, back pain and weight loss including symptom/medication management.    Next appt: Twania Bujak Norval Gable, NP  Cristi Gwynn Scherry Ran  [facility] [phone]

## 2023-08-22 NOTE — Patient Instructions (Addendum)
 Limit sweets to a few servings weekly  Eat more protein  Please have labs done at least 1 week prior to next prolia injection/ office visit

## 2023-08-27 ENCOUNTER — Telehealth: Payer: Self-pay

## 2023-08-27 ENCOUNTER — Other Ambulatory Visit: Payer: Self-pay | Admitting: Orthopedic Surgery

## 2023-08-27 DIAGNOSIS — F419 Anxiety disorder, unspecified: Secondary | ICD-10-CM

## 2023-08-27 NOTE — Telephone Encounter (Signed)
 Patient has request refill on medication  Lorazepam 1mg . Patient medication last refilled 08/22/2023. Patient has Non opioid contract on file dated 07/24/2022. Patient has no appointment coming up. Message pend and routed to PCP Octavia Heir, NP for approval.

## 2023-08-27 NOTE — Telephone Encounter (Signed)
 Patient call this afternoon because wanted an update on her medication Lorazepam that was sent to the pharmacy. Patient stating that she got a call from the pharmacy that the medication refill was denied but I did let the patient know the medication was reorder and that I would call the pharmacy to ask for an update.

## 2023-08-29 NOTE — Telephone Encounter (Signed)
 Left message on voicemail for patient to return call when available and to let her know that her she can pick it up her medication from the pharmacy.

## 2023-09-07 ENCOUNTER — Other Ambulatory Visit: Payer: Self-pay | Admitting: Internal Medicine

## 2023-09-07 ENCOUNTER — Other Ambulatory Visit: Payer: Self-pay | Admitting: Orthopedic Surgery

## 2023-09-07 DIAGNOSIS — K219 Gastro-esophageal reflux disease without esophagitis: Secondary | ICD-10-CM

## 2023-09-24 ENCOUNTER — Other Ambulatory Visit: Payer: Self-pay | Admitting: Orthopedic Surgery

## 2023-09-24 DIAGNOSIS — F419 Anxiety disorder, unspecified: Secondary | ICD-10-CM

## 2023-09-24 NOTE — Telephone Encounter (Signed)
 Pharmacy requested refill.  Epic LR: 08/22/2023 Contract Date: 08/21/2023  Pended Rx and sent to Hazle Nordmann, NP for approval.

## 2023-09-30 ENCOUNTER — Telehealth: Admitting: Physician Assistant

## 2023-09-30 ENCOUNTER — Telehealth: Admitting: Family Medicine

## 2023-09-30 DIAGNOSIS — B029 Zoster without complications: Secondary | ICD-10-CM | POA: Diagnosis not present

## 2023-09-30 DIAGNOSIS — R21 Rash and other nonspecific skin eruption: Secondary | ICD-10-CM

## 2023-09-30 MED ORDER — MUPIROCIN 2 % EX OINT: 1.0000 | TOPICAL_OINTMENT | Freq: Two times a day (BID) | CUTANEOUS | 0 refills | Status: DC

## 2023-09-30 MED ORDER — VALACYCLOVIR HCL 1 G PO TABS: 1000.0000 mg | ORAL_TABLET | Freq: Three times a day (TID) | ORAL | 0 refills | Status: AC

## 2023-09-30 NOTE — Progress Notes (Signed)
  Thank you for the details you included in the comment boxes. Those details are very helpful in determining the best course of treatment for you and help [place] to provide the best care.Because we need to get more clarification on your symptoms, we recommend that you schedule a Virtual Urgent Care video visit in order for the provider to better assess what is going on.  The provider will be able to give you a more accurate diagnosis and treatment plan if we can more freely discuss your symptoms and with the addition of a virtual examination.

## 2023-09-30 NOTE — Patient Instructions (Addendum)
 Rosemary Holms, thank you for joining Freddy Finner, NP for today's virtual visit.  While this provider is not your primary care provider (PCP), if your PCP is located in our provider database this encounter information will be shared with them immediately following your visit.   A [facility] MyChart account gives you access to today's visit and all your visits, tests, and labs performed at [facility] " click here if you don't have a [facility] MyChart account or go to [email or web address]  Consent: (Patient) TITILAYO HAGANS provided verbal consent for this virtual visit at the beginning of the encounter.  Current Medications:  Current Outpatient Medications:    mupirocin ointment (BACTROBAN) 2 %, Apply 1 Application topically 2 (two) times daily., Disp: 22 g, Rfl: 0   valACYclovir (VALTREX) 1000 MG tablet, Take 1 tablet (1,000 mg total) by mouth 3 (three) times daily for 7 days., Disp: 20 tablet, Rfl: 0   acetaminophen (TYLENOL) 325 MG tablet, Take 2 tablets (650 mg total) by mouth every 6 (six) hours as needed., Disp: 30 tablet, Rfl: 0   aspirin 81 MG tablet, Take 81 mg by mouth every morning. , Disp: , Rfl:    atorvastatin (LIPITOR) 10 MG tablet, TAKE 1 TABLET DAILY, Disp: 90 tablet, Rfl: 1   Fiber POWD, Take by mouth., Disp: , Rfl:    fluticasone (FLONASE) 50 MCG/ACT nasal spray, SPRAY 2 SPRAYS INTO EACH NOSTRIL EVERY DAY, Disp: 48 mL, Rfl: 3   gabapentin (NEURONTIN) 300 MG capsule, Take 2 capsules (600 mg total) by mouth 3 (three) times daily., Disp: 180 capsule, Rfl: 11   levothyroxine (SYNTHROID) 100 MCG tablet, TAKE 1 TABLET BY MOUTH EVERY DAY, Disp: 90 tablet, Rfl: 3   loratadine (CLARITIN) 10 MG tablet, TAKE 1 TABLET BY MOUTH EVERY DAY, Disp: 90 tablet, Rfl: 3   LORazepam (ATIVAN) 1 MG tablet, TAKE 1 TABLET (1 MG TOTAL) BY MOUTH EVERY 8 (EIGHT) HOURS AS NEEDED. FOR ANXIETY, Disp: 90 tablet, Rfl: 0   Magnesium 250 MG TABS, Take 2 tablets by mouth daily. , Disp:  , Rfl:    metoprolol tartrate (LOPRESSOR) 25 MG tablet, TAKE 1 TABLET BY MOUTH TWICE A DAY, Disp: 180 tablet, Rfl: 0   nitroGLYCERIN (NITROSTAT) 0.4 MG SL tablet, Place 1 tablet (0.4 mg total) under the tongue every 5 (five) minutes x 3 doses as needed for chest pain (if no relief after 3rd dose, proceed to ED or call 911)., Disp: 25 tablet, Rfl: 3   nystatin cream (MYCOSTATIN), APPLY 1 APPLICATION TOPICALLY AS NEEDED FOR DRY SKIN., Disp: 30 g, Rfl: 3   omeprazole (PRILOSEC) 40 MG capsule, TAKE 1 CAPSULE EVERY DAY, Disp: 90 capsule, Rfl: 1   Propylene Glycol (SYSTANE BALANCE) 0.6 % SOLN, Apply to eye daily as needed., Disp: , Rfl:    sodium chloride (OCEAN) 0.65 % nasal spray, Place 1 spray into the nose as needed for congestion., Disp: 30 mL, Rfl: 12   traMADol (ULTRAM) 50 MG tablet, TAKE 1/2 TABLET BY MOUTH DAILY, Disp: 15 tablet, Rfl: 3  Current Facility-Administered Medications:    [START ON 02/18/2024] denosumab (PROLIA) injection 60 mg, 60 mg, Subcutaneous, Q6 months, Fargo, Amy E, NP   Medications ordered in this encounter:  Meds ordered this encounter  Medications   mupirocin ointment (BACTROBAN) 2 %    Sig: Apply 1 Application topically 2 (two) times daily.    Dispense:  22 g    Refill:  0    Supervising Provider:  LAMPTEY, PHILIP O [[id number]]   valACYclovir (VALTREX) 1000 MG tablet    Sig: Take 1 tablet (1,000 mg total) by mouth 3 (three) times daily for 7 days.    Dispense:  20 tablet    Refill:  0    Supervising Provider:   Merrilee Jansky [[id number]]     *If you need refills on other medications prior to your next appointment, please contact your pharmacy*  Follow-Up: Call back or seek an in-person evaluation if the symptoms worsen or if the condition fails to improve as anticipated.  [facility] [phone]  Other Instructions  Impetigo, Adult Impetigo is an infection of the skin. It commonly occurs in young children, but it can also occur in adults.  The infection causes itchy blisters and sores that produce brownish-yellow fluid. As the fluid dries, it forms a thick, honey-colored crust. These skin changes usually occur on the face, but they can also affect other areas of the body. Impetigo usually goes away in 7-10 days with treatment. What are the causes? This condition is caused by two types of bacteria. It may be caused by staphylococci or streptococci bacteria. These bacteria cause impetigo when they get under the surface of the skin. This often happens after some damage to the skin, such as: Cuts, scrapes, or scratches. Rashes. Insect bites, especially when you scratch the area of a bite. Chickenpox or other illnesses that cause open skin sores. Nail biting or chewing. Impetigo can spread easily from one person to another (is contagious). It may be spread through close skin contact or by sharing towels, clothing, or other items that an infected person has touched. Scratching the affected area can cause impetigo to spread to other parts of the body. The bacteria can get under your fingernails and spread when you touch another area of your skin. What increases the risk? The following factors may make you more likely to develop this condition: Playing sports that include skin-to-skin contact with others. Having broken skin, such as from a cut or scrape. Living in an area that has high humidity levels. Having poor hygiene. Having high levels of staphylococci in your nose. Having a condition that weakens the skin integrity, such as: Having a weak body defense system (immune system). Having a skin condition with open sores, such as chickenpox. Having diabetes. What are the signs or symptoms? The main symptom of this condition is small blisters, often on the face around the mouth and nose. In time, the blisters break open and turn into tiny sores (lesions) with a yellow crust. In some cases, the blisters cause itching or burning.  Scratching, irritation, or lack of treatment may cause these small lesions to get larger. Other possible symptoms include: Larger blisters. Pus. Swollen lymph glands. How is this diagnosed? This condition is usually diagnosed during a physical exam. A skin sample or a sample of fluid from a blister may be taken for lab tests that involve growing bacteria (culture test). Lab tests can help confirm the diagnosis or help determine the best treatment. How is this treated? Treatment for this condition depends on the severity of the condition: Mild impetigo can be treated with prescription antibiotic cream. Oral antibiotic medicine may be used in more severe cases. Medicines that reduce itchiness (antihistamines)may also be used. Follow these instructions at home: Medicines Take over-the-counter and prescription medicines only as told by your health care provider. Apply or take your antibiotic as told by your health care provider. Do not  stop using the antibiotic even if your condition improves. Before applying antibiotic cream or ointment, you should: Gently wash the infected areas with antibacterial soap and warm water. Soak crusted areas in warm, soapy water using antibacterial soap. Gently rub the areas to remove crusts. Do not scrub. Preventing the spread of infection  To help prevent impetigo from spreading to other body areas: Keep your fingernails short and clean. Do not scratch the blisters or sores. Cover infected areas, if necessary, to keep from scratching. Wash your hands often with soap and warm water. To help prevent impetigo from spreading to other people: Do not share towels. Wash your clothing and bedsheets in water that is 140F (60C) or warmer. Stay home until you have used an antibiotic cream for 48 hours (2 days) or an oral antibiotic medicine for 24 hours (1 day). You should only return to work and activities with other people if your skin shows significant  improvement. You may return to contact sports after you have used antibiotic medicine for 72 hours (3 days). General instructions Keep all follow-up visits. This is important. How is this prevented? Wash your hands often with soap and warm water. Do not share towels, washcloths, clothing, bedding, or razors. Keep your fingernails short. Keep any cuts, scrapes, bug bites, or rashes clean and covered. Use insect repellent to prevent bug bites. Contact a health care provider if: You develop more blisters or sores, even with treatment. Other family members get sores. Your skin sores are not improving after 72 hours (3 days) of treatment. You have a fever. Get help right away if: You see spreading redness or swelling of the skin around your sores. You develop a sore throat. The area around your rash becomes warm, red, or tender to the touch. You have dark, reddish-brown urine. You do not urinate often or you urinate small amounts. You are very tired (lethargic). You have swelling in your face, hands, or feet. Summary Impetigo is a skin infection that causes itchy blisters and sores that produce brownish-yellow fluid. As the fluid dries, it forms a crust. This condition is caused by staphylococci or streptococci bacteria. These bacteria cause impetigo when they get under the surface of the skin, such as through cuts, rashes, bug bites, or open sores. Treatment for this condition may include antibiotic ointment or oral antibiotics. To help prevent impetigo from spreading to other body areas, make sure you keep your fingernails short, avoid scratching, cover any blisters, and wash your hands often. If you have impetigo, stay home until you have used an antibiotic cream for 48 hours (2 days) or an oral antibiotic medicine for 24 hours (1 day). You should only return to work and activities with other people if your skin shows significant improvement. This information is not intended to replace  advice given to you by your health care provider. Make sure you discuss any questions you have with your health care provider. Document Revised: 11/11/2019 Document Reviewed: 11/11/2019 Elsevier Patient Education  2024 Elsevier Inc.Shingles  Shingles, or herpes zoster, is an infection. It gives you a skin rash and blisters. These infected areas may hurt a lot. Shingles only happens if: You've had chickenpox. You've been given a shot called a vaccine to protect you from getting chickenpox. Shingles is rare in this case. What are the causes? Shingles is caused by a germ called the varicella-zoster virus. This is the same germ that causes chickenpox. After you're exposed to the germ, it stays in your body but  is dormant. This means it isn't active. Shingles happens if the germ becomes active again. This can happen years after you're first exposed to the germ. What increases the risk? You may be more likely to get shingles if: You're older than 88 years of age. You're under a lot of stress. You have a weak immune system. The immune system is your body's defense system. It may be weak if: You have human immunodeficiency virus (HIV). You have acquired immunodeficiency syndrome (AIDS). You have cancer. You take medicines that weaken your immune system. These include organ transplant medicines. What are the signs or symptoms? The first symptoms of shingles may be itching, tingling, or pain. Your skin may feel like it's burning. A few days or weeks later, you'll get a rash. Here's what you can expect: The rash is likely to be on one side of your body. The rash may be shaped like a belt or a band. Over time, it will turn into blisters filled with fluid. The blisters will break open and change into scabs. The scabs will dry up in about 2-3 weeks. You may also have: A fever. Chills. A headache. Nausea. How is this diagnosed? Shingles is diagnosed with a skin exam. A sample called a culture may  be taken from one of your blisters and sent to a lab. This will show if you have shingles. How is this treated? The rash may last for several weeks. There's no cure for shingles, but your health care provider may give you medicines. These medicines may: Help with pain. Help with itching. Help with irritation and swelling. Help you get better sooner. Help to prevent long-term problems. If the rash is on your face, you may need to see an eye doctor or an ear, nose, and throat (ENT) doctor. Follow these instructions at home: Medicines Take your medicines only as told by your provider. Put an anti-itch cream or numbing cream on the rash or blisters as told by your provider. Relieving itching and discomfort  To help with itching: Put cold, wet cloths called cold compresses on the rash or blisters. Take a cool bath. Try adding baking soda or dry oatmeal to the water. Do not bathe in hot water. Use calamine lotion on the rash or blisters. You can get this type of lotion at the store. Blister and rash care Keep your rash covered with a loose bandage. Wear loose clothes that don't rub on your rash. Take care of your rash as told by your provider. Make sure you: Wash your hands with soap and water for at least 20 seconds before and after you change your bandage. If you can't use soap and water, use hand sanitizer. Keep your rash and blisters clean by washing them with mild soap and cool water. Change your bandage. Check your rash every day for signs of infection. Check for: More redness, swelling, or pain. Fluid or blood. Warmth. Pus or a bad smell. Do not scratch your rash. Do not pick at your blisters. To help you not scratch: Keep your fingernails clean and cut short. Try to wear gloves or mittens when you sleep. General instructions Rest. Wash your hands often with soap and water for at least 20 seconds. If you can't use soap and water, use hand sanitizer. Washing your hands lowers your  chance of getting a skin infection. Your infection can cause chickenpox in others. If you have blisters that aren't scabs yet, stay away from: Babies. Pregnant people. Children who have eczema.  Older people who have organ transplants. People who have a long-term, or chronic, illness. Anyone who hasn't had chickenpox before. Anyone who hasn't gotten the chickenpox vaccine. How is this prevented? Vaccines are the best way to prevent you from getting chickenpox or shingles. Talk with your provider about getting these shots. Where to find more information Centers for Disease Control and Prevention (CDC): TonerPromos.no Contact a health care provider if: Your pain doesn't get better with medicine. Your pain doesn't get better after the rash heals. You have any signs of infection around the rash. Your rash or blisters get worse. You have a fever or chills. Get help right away if: The rash is on your face or nose. You have pain in your face or by your eye. You lose feeling on one side of your face. You have trouble seeing. You have ear pain or ringing in your ear. This information is not intended to replace advice given to you by your health care provider. Make sure you discuss any questions you have with your health care provider. Document Revised: 03/14/2023 Document Reviewed: 07/27/2022 Elsevier Patient Education  2024 Elsevier Inc.   If you have been instructed to have an in-person evaluation today at a local Urgent Care facility, please use the link below. It will take you to a list of all of our available [facility], including address, phone number and hours of operation. Please do not delay care.  [facility]  If you or a family member do not have a primary care provider, use the link below to schedule a visit and establish care. When you choose a [facility] primary care physician or advanced practice provider, you gain a long-term partner in health. Find a  Primary Care Provider  Learn more about [facility]'s in-office and virtual care options: [facility] - Get Care Now

## 2023-09-30 NOTE — Progress Notes (Signed)
 Virtual Visit Consent   Julie Park, you are scheduled for a virtual visit with a [facility] provider today. Just as with appointments in the office, your consent must be obtained to participate. Your consent will be active for this visit and any virtual visit you may have with one of our providers in the next 365 days. If you have a MyChart account, a copy of this consent can be sent to you electronically.  As this is a virtual visit, video technology does not allow for your provider to perform a traditional examination. This may limit your provider's ability to fully assess your condition. If your provider identifies any concerns that need to be evaluated in person or the need to arrange testing (such as labs, EKG, etc.), we will make arrangements to do so. Although advances in technology are sophisticated, we cannot ensure that it will always work on either your end or our end. If the connection with a video visit is poor, the visit may have to be switched to a telephone visit. With either a video or telephone visit, we are not always able to ensure that we have a secure connection.  By engaging in this virtual visit, you consent to the provision of healthcare and authorize for your insurance to be billed (if applicable) for the services provided during this visit. Depending on your insurance coverage, you may receive a charge related to this service.  I need to obtain your verbal consent now. Are you willing to proceed with your visit today? Julie Park has provided verbal consent on 09/30/2023 for a virtual visit (video or telephone). Julie Finner, NP  Date: 09/30/2023 3:37 PM   Virtual Visit via Video Note   I, Julie Park, connected with  Julie Park  ([id number], 1933-08-10) on 09/30/23 at  3:45 PM EDT by a video-enabled telemedicine application and verified that I am speaking with the correct person using two identifiers.  Location: Patient: Virtual Visit Location Patient:  Home Provider: Virtual Visit Location Provider: Home Office   I discussed the limitations of evaluation and management by telemedicine and the availability of in person appointments. The patient expressed understanding and agreed to proceed.    History of Present Illness: Julie Park is a 89 y.o. who identifies as a female who was assigned female at birth, and is being seen today for rash  Onset was 3 days ago  Associated symptoms are headache at times Has had shingles in 1968 on the left side on the bra line area, and shingles again in 2010 had them again on the face Modifying factors are none at this time Denies chest pain, shortness of breath, fevers, chills  Problems:  Patient Active Problem List   Diagnosis Date Noted   Right hip pain 03/12/2023   Cardiac chest pain 08/20/2022   Lower extremity pain 08/20/2022   Gait abnormality 04/25/2021   Nevus of choroid of right eye 08/24/2019   Posterior vitreous detachment of right eye 08/24/2019   Pseudophakia of both eyes 08/24/2019   GAD (generalized anxiety disorder) 07/11/2018   Overactive bladder 07/11/2018   Functional urinary incontinence 07/10/2018   Hyponatremia 03/31/2014   HLD (hyperlipidemia)    Diverticular disease    Anxiety state    GERD (gastroesophageal reflux disease)    Hypothyroidism    Urine incontinence    IRRITABLE BOWEL SYNDROME 03/02/2010    Allergies: No Known Allergies Medications:  Current Outpatient Medications:    acetaminophen (TYLENOL) 325  MG tablet, Take 2 tablets (650 mg total) by mouth every 6 (six) hours as needed., Disp: 30 tablet, Rfl: 0   aspirin 81 MG tablet, Take 81 mg by mouth every morning. , Disp: , Rfl:    atorvastatin (LIPITOR) 10 MG tablet, TAKE 1 TABLET DAILY, Disp: 90 tablet, Rfl: 1   Fiber POWD, Take by mouth., Disp: , Rfl:    fluticasone (FLONASE) 50 MCG/ACT nasal spray, SPRAY 2 SPRAYS INTO EACH NOSTRIL EVERY DAY, Disp: 48 mL, Rfl: 3   gabapentin (NEURONTIN) 300 MG  capsule, Take 2 capsules (600 mg total) by mouth 3 (three) times daily., Disp: 180 capsule, Rfl: 11   levothyroxine (SYNTHROID) 100 MCG tablet, TAKE 1 TABLET BY MOUTH EVERY DAY, Disp: 90 tablet, Rfl: 3   loratadine (CLARITIN) 10 MG tablet, TAKE 1 TABLET BY MOUTH EVERY DAY, Disp: 90 tablet, Rfl: 3   LORazepam (ATIVAN) 1 MG tablet, TAKE 1 TABLET (1 MG TOTAL) BY MOUTH EVERY 8 (EIGHT) HOURS AS NEEDED. FOR ANXIETY, Disp: 90 tablet, Rfl: 0   Magnesium 250 MG TABS, Take 2 tablets by mouth daily. , Disp: , Rfl:    metoprolol tartrate (LOPRESSOR) 25 MG tablet, TAKE 1 TABLET BY MOUTH TWICE A DAY, Disp: 180 tablet, Rfl: 0   nitroGLYCERIN (NITROSTAT) 0.4 MG SL tablet, Place 1 tablet (0.4 mg total) under the tongue every 5 (five) minutes x 3 doses as needed for chest pain (if no relief after 3rd dose, proceed to ED or call 911)., Disp: 25 tablet, Rfl: 3   nystatin cream (MYCOSTATIN), APPLY 1 APPLICATION TOPICALLY AS NEEDED FOR DRY SKIN., Disp: 30 g, Rfl: 3   omeprazole (PRILOSEC) 40 MG capsule, TAKE 1 CAPSULE EVERY DAY, Disp: 90 capsule, Rfl: 1   Propylene Glycol (SYSTANE BALANCE) 0.6 % SOLN, Apply to eye daily as needed., Disp: , Rfl:    sodium chloride (OCEAN) 0.65 % nasal spray, Place 1 spray into the nose as needed for congestion., Disp: 30 mL, Rfl: 12   traMADol (ULTRAM) 50 MG tablet, TAKE 1/2 TABLET BY MOUTH DAILY, Disp: 15 tablet, Rfl: 3  Current Facility-Administered Medications:    [START ON 02/18/2024] denosumab (PROLIA) injection 60 mg, 60 mg, Subcutaneous, Q6 months, Fargo, Amy E, NP  Observations/Objective: Patient is well-developed, well-nourished in no acute distress.  Resting comfortably  at home.  Head is normocephalic, atraumatic.  No labored breathing.  Speech is clear and coherent with logical content.  Patient is alert and oriented at baseline.  Small rash above the left lip area- some crusting noted  Assessment and Plan:  1. Herpes zoster without complication (Primary)  -  mupirocin ointment (BACTROBAN) 2 %; Apply 1 Application topically 2 (two) times daily.  Dispense: 22 g; Refill: 0 - valACYclovir (VALTREX) 1000 MG tablet; Take 1 tablet (1,000 mg total) by mouth 3 (three) times daily for 7 days.  Dispense: 20 tablet; Refill: 0  -will treat as shingles given her history, and lack of ability to feel pain because of her higher level Neurontin  -given the look of impetigo and classic location- will also order mupirocin for use -keep area clean  -follow up if not improving    Reviewed side effects, risks and benefits of medication.    Patient acknowledged agreement and understanding of the plan.   Past Medical, Surgical, Social History, Allergies, and Medications have been Reviewed.    Follow Up Instructions: I discussed the assessment and treatment plan with the patient. The patient was provided an opportunity to ask  questions and all were answered. The patient agreed with the plan and demonstrated an understanding of the instructions.  A copy of instructions were sent to the patient via MyChart unless otherwise noted below.    The patient was advised to call back or seek an in-person evaluation if the symptoms worsen or if the condition fails to improve as anticipated.    Julie Finner, NP

## 2023-10-23 ENCOUNTER — Encounter: Payer: Self-pay | Admitting: Orthopedic Surgery

## 2023-10-23 ENCOUNTER — Other Ambulatory Visit: Payer: Self-pay | Admitting: Orthopedic Surgery

## 2023-10-23 DIAGNOSIS — G8929 Other chronic pain: Secondary | ICD-10-CM

## 2023-10-23 NOTE — Telephone Encounter (Signed)
Message routed to PCP Fargo, Amy E, NP  

## 2023-10-23 NOTE — Telephone Encounter (Signed)
 Noted. Thank you. Message routed to [facility].B referral coordinator.

## 2023-10-24 ENCOUNTER — Other Ambulatory Visit: Payer: Self-pay | Admitting: Nurse Practitioner

## 2023-10-24 ENCOUNTER — Other Ambulatory Visit: Payer: Self-pay | Admitting: Orthopedic Surgery

## 2023-10-24 DIAGNOSIS — F419 Anxiety disorder, unspecified: Secondary | ICD-10-CM

## 2023-10-24 DIAGNOSIS — G8929 Other chronic pain: Secondary | ICD-10-CM

## 2023-10-25 NOTE — Telephone Encounter (Signed)
 Patient has request refill on medication Tramadol  50mg . Patient medication last refilled 06/24/2023. Patient has non opioid contract on file dated 09/04/2023. Patient medication pend and sent to PCP Arnetha Bhat, NP for approval.

## 2023-10-25 NOTE — Telephone Encounter (Signed)
 Patient has request refill on medication Lorazepam . Patient medication last refilled 09/24/2023. Patient has contract on file dated 09/04/2023. Patient medication pend and sent to PCP Arnetha Bhat, NP for approval.

## 2023-10-31 ENCOUNTER — Telehealth: Payer: Self-pay | Admitting: Orthopedic Surgery

## 2023-10-31 NOTE — Telephone Encounter (Signed)
 Please advise.   Copied from CRM [id number]. Topic: Referral - Status >> Oct 31, 2023  9:03 AM Carrielelia G wrote: Reason for CRM: Patients daughter called, stating that patient Julie Park's referral need to be re coded.. it is currently going to  "[facility]" when it should be going to "[facility]"   Please advise.

## 2023-11-11 ENCOUNTER — Other Ambulatory Visit (HOSPITAL_COMMUNITY): Payer: Self-pay | Admitting: Orthopedic Surgery

## 2023-11-11 ENCOUNTER — Ambulatory Visit (HOSPITAL_BASED_OUTPATIENT_CLINIC_OR_DEPARTMENT_OTHER): Attending: Orthopedic Surgery | Admitting: Physical Therapy

## 2023-11-11 ENCOUNTER — Encounter (HOSPITAL_BASED_OUTPATIENT_CLINIC_OR_DEPARTMENT_OTHER): Payer: Self-pay | Admitting: Physical Therapy

## 2023-11-11 ENCOUNTER — Other Ambulatory Visit: Payer: Self-pay

## 2023-11-11 DIAGNOSIS — R2681 Unsteadiness on feet: Secondary | ICD-10-CM | POA: Insufficient documentation

## 2023-11-11 DIAGNOSIS — M5459 Other low back pain: Secondary | ICD-10-CM | POA: Diagnosis present

## 2023-11-11 DIAGNOSIS — Z1231 Encounter for screening mammogram for malignant neoplasm of breast: Secondary | ICD-10-CM

## 2023-11-11 DIAGNOSIS — M6281 Muscle weakness (generalized): Secondary | ICD-10-CM | POA: Diagnosis present

## 2023-11-11 NOTE — Therapy (Signed)
 OUTPATIENT PHYSICAL THERAPY THORACOLUMBAR EVALUATION   Patient Name: Julie Park MRN: [id number] DOB:17-Mar-1934, 89 y.o., female Today's Date: 11/11/2023  END OF SESSION:  PT End of Session - 11/11/23 1741     Visit Number 1    Number of Visits 16    Date for PT Re-Evaluation 02/07/24    Authorization Type Rain[employer]unting [employer]    PT Start Time 1532    PT Stop Time 1614    PT Time Calculation (min) 42 min    Activity Tolerance Patient tolerated treatment well    Behavior During Therapy [facility] for tasks assessed/performed             Past Medical History:  Diagnosis Date   Anxiety    Chest pain 02/08/15   ETT normal   Diverticulosis    Diverticulitis (1982)   Fracture of rib of right side 08/16/2013   GERD (gastroesophageal reflux disease)    Hyperlipidemia, mixed    Hypothyroidism    IBS (irritable bowel syndrome)    Morton's neuroma    Right lumbar radiculopathy    Intermittent x 2 episodes in summer 2014   Shingles 1967 and 2010   Ulcer    Urine incontinence    Vertigo    Past Surgical History:  Procedure Laterality Date   CARDIOVASCULAR STRESS TEST  02/08/15   ETT normal   CATARACT EXTRACTION  2012   Dr. Lasandra Points   COLONOSCOPY  2005   Dr. Hercules Lombard at [facility] (normal per pt report)   DEXA  11/03/13   Heel T score -0.8.   FOOT NEUROMA SURGERY  1994   right foot   TONSILLECTOMY     TONSILLECTOMY AND ADENOIDECTOMY  1948   Dr. Eppie Hasting [facility]   TUBAL LIGATION  [phone]   WISDOM TOOTH EXTRACTION     Patient Active Problem List   Diagnosis Date Noted   Right hip pain 03/12/2023   Cardiac chest pain 08/20/2022   Lower extremity pain 08/20/2022   Gait abnormality 04/25/2021   Nevus of choroid of right eye 08/24/2019   Posterior vitreous detachment of right eye 08/24/2019   Pseudophakia of both eyes 08/24/2019   GAD (generalized anxiety disorder) 07/11/2018   Overactive bladder 07/11/2018   Functional urinary incontinence 07/10/2018   Hyponatremia 03/31/2014    HLD (hyperlipidemia)    Diverticular disease    Anxiety state    GERD (gastroesophageal reflux disease)    Hypothyroidism    Urine incontinence    IRRITABLE BOWEL SYNDROME 03/02/2010    PCP: Arnetha Bhat, NP   REFERRING PROVIDER: Arnetha Bhat, NP   REFERRING DIAG: M54.41,M54.42,G89.29 (ICD-10-CM) - Chronic midline low back pain with bilateral sciatica   Rationale for Evaluation and Treatment: Rehabilitation  THERAPY DIAG:  Other low back pain  Muscle weakness (generalized)  Unsteadiness on feet  ONSET DATE: 2020  SUBJECTIVE:  SUBJECTIVE STATEMENT: Picked up a case of waters in 2020 and had compression fx  (T10 or 12) which has healed but pain has been consistent since then.  I also have oa right hip which hurts.  I am afraid of falling.  I don't move around much in my home.  Use a cane when I lave the house.  As long as I can have 2 fingers on the wall or sofa I can get around my house pretty good.  PERTINENT HISTORY:  Thoracic compression fx 2020 Oa hip right  PAIN:  Are you having pain? Yes: NPRS scale: 2/10 LB Pain location: mid to lower back Pain description: ache Aggravating factors: walking 10 minutes Relieving factors: resting lying down  PRECAUTIONS: Fall  RED FLAGS: None   WEIGHT BEARING RESTRICTIONS: No  FALLS:  Has patient fallen in last 6 months? No  LIVING ENVIRONMENT: Lives with: lives with their family Lives in: House/apartment Stairs: Yes: Internal: 12-14 steps; on right going up and on left going up and External: 0 steps; none Has following equipment at home: Quad cane large base, Environmental consultant - 4 wheeled, Marine scientist  OCCUPATION: retired  PLOF: Insurance account manager touch with walking through home    PATIENT GOALS: be able to walk for a period of time with  little to  no pain in my back. Dtr wants mother to improve gait.  NEXT MD VISIT: as needed  OBJECTIVE:  Note: Objective measures were completed at Evaluation unless otherwise noted.  DIAGNOSTIC FINDINGS:  03/11/24 IMPRESSION: Moderate degenerative joint disease of right hip. No acute abnormality seen.  PATIENT SURVEYS:  Modified Oswestry 23/50=46%   COGNITION: Overall cognitive status: Within functional limits for tasks assessed     SENSATION: WFL  MUSCLE LENGTH: Hamstrings: mild restriction on B Gastrocnemius: moderate restriction on B   POSTURE: rounded shoulders, forward head, and flexed trunk (standing).  Left pelvic obliquity   LUMBAR ROM:   AROM eval  Flexion full  Extension 50% limited P!  Right lateral flexion P!  Left lateral flexion 50% limited P!  Right rotation   Left rotation    (Blank rows = not tested)    LOWER EXTREMITY MMT:    MMT Right eval Left eval  Hip flexion 3+ 3+  Hip extension    Hip abduction 4+ 4+  Hip adduction 5 5  Hip internal rotation    Hip external rotation    Knee flexion 3+ 3+  Knee extension 3+ 3+  Ankle dorsiflexion    Ankle plantarflexion    Ankle inversion    Ankle eversion     (Blank rows = not tested)    FUNCTIONAL TESTS:  30 seconds chair stand test: 3 from pool bench with ue support Timed up and go (TUG): >30s BERG Balance Test          Date:   Sit to Stand 3  Standing unsupported 4  Sitting with back unsupported but feet supported 4  Stand to sit  2  Transfers  3  Standing unsupported with eyes closed 3  Standing unsupported feet together 3  From standing position, reach forward with outstretched arm 3  From standing position, pick up object from floor 3  From standing position, turn and look behind over each shoulder 1  Turn 360 2  Standing unsupported, alternately place foot on step 0  Standing unsupported, one foot in front 0  Standing on one leg 0  Total:  31/56  GAIT: Distance  walked: 400 Assistive device utilized: Personal assistant bench Level of assistance: Modified independence Comments: guarded, reduced step length and cadence, decreased heel strike and toe off  TREATMENT  Eval Self care:Posture and Optometrist handout and instruction.  Gait: use of Ad. Increasing step length. Use of hans rails with stair negotiation. Walking outdoor                                                                                                                            PATIENT EDUCATION:  Education details: Discussed eval findings, rehab rationale, aquatic program progression/POC and pools in area. Patient is in agreement  Person educated: Patient Education method: Chief Technology Officer Education comprehension: verbalized understanding  HOME EXERCISE PROGRAM: Walking daily   ASSESSMENT:  CLINICAL IMPRESSION: Patient is a 89 y.o. f who was seen today for physical therapy evaluation and treatment for LBP. She sustained a compression fx 5 years ago which continues to cause pain. As per pt, she is afraid of falling and because of that has been very sedentary. Has not had a fall in past year.  Main goals are to improve gait, toleration to amb and improve strength for overall improved function/mobility and health.  Exam indicates decreased strength throughout LE and core, balance and proprioceptive deficits with increased risk of falls, Left pelvic obliquity and gait deviation.  She will benefit from skilled physical therapy to improve all areas of deficits as well as QOL.  OBJECTIVE IMPAIRMENTS: Abnormal gait, decreased activity tolerance, decreased balance, decreased coordination, decreased knowledge of use of DME, decreased mobility, difficulty walking, decreased strength, postural dysfunction, and pain.   ACTIVITY LIMITATIONS: carrying, lifting, bending, standing, squatting, stairs, transfers, and locomotion level  PARTICIPATION LIMITATIONS: meal prep,  cleaning, shopping, and community activity  PERSONAL FACTORS: Age, Fitness, Past/current experiences, and Time since onset of injury/illness/exacerbation are also affecting patient's functional outcome.   REHAB POTENTIAL: Good  CLINICAL DECISION MAKING: Evolving/moderate complexity  EVALUATION COMPLEXITY: Moderate   GOALS: Goals reviewed with patient? Yes  SHORT TERM GOALS: Target date: 12/08/23  Pt will tolerate full aquatic sessions consistently without increase in pain and with improving function to demonstrate good toleration and effectiveness of intervention.  Baseline: Goal status:INITIAL  2.  Pt will tolerate stair climbing using combination of an alternating and step to pattern ascending and descending 6 steps without use of handrail Baseline:  Goal status: INITIAL  3.  Pt will report reduction of pain submerged to 0/10 Baseline:  Goal status: INITIAL  4.  Pt will begin amb routinely 2-3 x weekly with daughter for exercise as HEP. Baseline:  Goal status: INITIAL   LONG TERM GOALS: Target date: 02/07/24  Pt to improve on ODI by 13-15 (MCID)  % to demonstrate statistically significant Improvement in function. Baseline:Modified Oswestry 23/50=46%  Goal status: INITIAL   2. Pt will amb x1000 ft without limitation to pain using ad Baseline:  Goal status: INITIAL  3.  Pt will improve on Berg balance test to >/= 40/56 to demonstrate a decrease in fall risk. Baseline: 31/56 Goal status: INITIAL  4.  Pt will improve strength in all areas listed by 1 grade to demonstrate improved overall physical function Baseline:  Goal status: INITIAL  5.  Pt will demonstrate improved gait pattern with more normal step length with heel strike and toe off each step Baseline:  Goal status: INITIAL  6.  Pt will improve on Tug test to <or= 21s (avg for older adults) to demonstrate improvement in lower extremity function, mobility and decreased fall risk. Baseline:  Goal status:  INITIAL  PLAN:  PT FREQUENCY: 2x/week  PT DURATION: 8 weeks  PLANNED INTERVENTIONS: 97164- PT Re-evaluation, 97110-Therapeutic exercises, 97530- Therapeutic activity, 97112- Neuromuscular re-education, 97535- Self Care, 16109- Manual therapy, [phone]- Gait training, [phone]- Aquatic Therapy, [phone]- Electrical stimulation (unattended), [phone]- Ionotophoresis 4mg /ml Dexamethasone, Patient/Family education, Balance training, Stair training, Taping, Dry Needling, Joint mobilization, DME instructions, Cryotherapy, and Moist heat.  PLAN FOR NEXT SESSION: general core and LE strengthening, balance retraining, gait, AD instruction, pain managemen.  Begin aquatics only, transition land after a few weeks   Adriana Hopping Laneta Pintos) Krystie Leiter MPT 11/11/23 5:43 PM [facility] [address] [place], [place], [address] Phone: [phone]   Fax:  [phone]

## 2023-11-24 ENCOUNTER — Other Ambulatory Visit: Payer: Self-pay | Admitting: Orthopedic Surgery

## 2023-11-24 DIAGNOSIS — G8929 Other chronic pain: Secondary | ICD-10-CM

## 2023-11-24 DIAGNOSIS — B372 Candidiasis of skin and nail: Secondary | ICD-10-CM

## 2023-11-24 DIAGNOSIS — F419 Anxiety disorder, unspecified: Secondary | ICD-10-CM

## 2023-11-25 ENCOUNTER — Other Ambulatory Visit: Payer: Self-pay | Admitting: Orthopedic Surgery

## 2023-11-25 DIAGNOSIS — G8929 Other chronic pain: Secondary | ICD-10-CM

## 2023-11-25 NOTE — Telephone Encounter (Signed)
 Patient pharmacy states that she pick up controlled medications on 10/30/2023 . Patient states that she doesn't remember picking up prescriptions during that time. Pharmacy wanted to know if you'll make exception for her to pick up recent refill today on Lorazepam /Tramadol  11/25/2023 due to her going out of town. Message routed to PCP Arnetha Bhat, NP

## 2023-11-25 NOTE — Telephone Encounter (Signed)
 Copied from CRM [id number]. Topic: Clinical - Medication Refill >> Nov 25, 2023 11:37 AM Blair Bumpers wrote: Medication: LORazepam  (ATIVAN ) 1 MG tablet & traMADol  (ULTRAM ) 50 MG tablet  Has the patient contacted their pharmacy? Yes (Agent: If no, request that the patient contact the pharmacy for the refill. If patient does not wish to contact the pharmacy document the reason why and proceed with request.) (Agent: If yes, when and what did the pharmacy advise?)  This is the patient's preferred pharmacy:  [id number] - [place], [place] - [address] [place] [address] Phone: [phone] Fax: [phone]  Is this the correct pharmacy for this prescription? Yes If no, delete pharmacy and type the correct one.   Has the prescription been filled recently? Yes  Is the patient out of the medication? Yes  Has the patient been seen for an appointment in the last year OR does the patient have an upcoming appointment? Yes  Can we respond through MyChart? Yes  Agent: Please be advised that Rx refills may take up to 3 business days. We ask that you follow-up with your pharmacy.

## 2023-11-26 NOTE — Telephone Encounter (Signed)
 Im unable to refuse/delete medication because I don't have permission. Message routed back to PCP Arnetha Bhat, NP

## 2023-11-27 ENCOUNTER — Encounter (HOSPITAL_BASED_OUTPATIENT_CLINIC_OR_DEPARTMENT_OTHER): Payer: Self-pay | Admitting: Physical Therapy

## 2023-11-27 ENCOUNTER — Ambulatory Visit (HOSPITAL_BASED_OUTPATIENT_CLINIC_OR_DEPARTMENT_OTHER): Attending: Orthopedic Surgery | Admitting: Physical Therapy

## 2023-11-27 DIAGNOSIS — R2681 Unsteadiness on feet: Secondary | ICD-10-CM | POA: Insufficient documentation

## 2023-11-27 DIAGNOSIS — M6281 Muscle weakness (generalized): Secondary | ICD-10-CM | POA: Insufficient documentation

## 2023-11-27 DIAGNOSIS — M25551 Pain in right hip: Secondary | ICD-10-CM | POA: Insufficient documentation

## 2023-11-27 DIAGNOSIS — M5459 Other low back pain: Secondary | ICD-10-CM | POA: Diagnosis present

## 2023-11-27 NOTE — Therapy (Signed)
 OUTPATIENT PHYSICAL THERAPY THORACOLUMBAR EVALUATION   Patient Name: Julie Park MRN: [id number] DOB:1934/06/10, 89 y.o., female Today's Date: 11/27/2023  END OF SESSION:  PT End of Session - 11/27/23 1530     Visit Number 2    Number of Visits 16    Date for PT Re-Evaluation 02/07/24    Authorization Type [employer]    PT Start Time 1531    PT Stop Time 1610    PT Time Calculation (min) 39 min    Activity Tolerance Patient tolerated treatment well    Behavior During Therapy [facility] for tasks assessed/performed             Past Medical History:  Diagnosis Date   Anxiety    Chest pain 02/08/15   ETT normal   Diverticulosis    Diverticulitis (1982)   Fracture of rib of right side 08/16/2013   GERD (gastroesophageal reflux disease)    Hyperlipidemia, mixed    Hypothyroidism    IBS (irritable bowel syndrome)    Morton's neuroma    Right lumbar radiculopathy    Intermittent x 2 episodes in summer 2014   Shingles 1967 and 2010   Ulcer    Urine incontinence    Vertigo    Past Surgical History:  Procedure Laterality Date   CARDIOVASCULAR STRESS TEST  02/08/15   ETT normal   CATARACT EXTRACTION  2012   Dr. Lasandra Points   COLONOSCOPY  2005   Dr. Hercules Lombard at [facility] (normal per pt report)   DEXA  11/03/13   Heel T score -0.8.   FOOT NEUROMA SURGERY  1994   right foot   TONSILLECTOMY     TONSILLECTOMY AND ADENOIDECTOMY  1948   Dr. Eppie Hasting [facility]   TUBAL LIGATION  [phone]   WISDOM TOOTH EXTRACTION     Patient Active Problem List   Diagnosis Date Noted   Right hip pain 03/12/2023   Cardiac chest pain 08/20/2022   Lower extremity pain 08/20/2022   Gait abnormality 04/25/2021   Nevus of choroid of right eye 08/24/2019   Posterior vitreous detachment of right eye 08/24/2019   Pseudophakia of both eyes 08/24/2019   GAD (generalized anxiety disorder) 07/11/2018   Overactive bladder 07/11/2018   Functional urinary incontinence 07/10/2018   Hyponatremia 03/31/2014    HLD (hyperlipidemia)    Diverticular disease    Anxiety state    GERD (gastroesophageal reflux disease)    Hypothyroidism    Urine incontinence    IRRITABLE BOWEL SYNDROME 03/02/2010    PCP: Arnetha Bhat, NP   REFERRING PROVIDER: Arnetha Bhat, NP   REFERRING DIAG: M54.41,M54.42,G89.29 (ICD-10-CM) - Chronic midline low back pain with bilateral sciatica   Rationale for Evaluation and Treatment: Rehabilitation  THERAPY DIAG:  Other low back pain  Unsteadiness on feet  Pain in right hip  Muscle weakness (generalized)  ONSET DATE: 2020  SUBJECTIVE:  SUBJECTIVE STATEMENT: I didn't sleep well last night (dtr thinks she was a little nervous abut today)   Initial Subjective Picked up a case of waters in 2020 and had compression fx  (T10 or 12) which has healed but pain has been consistent since then.  I also have oa right hip which hurts.  I am afraid of falling.  I don't move around much in my home.  Use a cane when I lave the house.  As long as I can have 2 fingers on the wall or sofa I can get around my house pretty good.  PERTINENT HISTORY:  Thoracic compression fx 2020 Oa hip right  PAIN:  Are you having pain? Yes: NPRS scale: 0/10 LB Pain location: mid to lower back Pain description: ache Aggravating factors: walking 10 minutes Relieving factors: resting lying down  PRECAUTIONS: Fall  RED FLAGS: None   WEIGHT BEARING RESTRICTIONS: No  FALLS:  Has patient fallen in last 6 months? No  LIVING ENVIRONMENT: Lives with: lives with their family Lives in: House/apartment Stairs: Yes: Internal: 12-14 steps; on right going up and on left going up and External: 0 steps; none Has following equipment at home: Quad cane large base, Environmental consultant - 4 wheeled, Marine scientist  OCCUPATION:  retired  PLOF: Insurance account manager touch with walking through home    PATIENT GOALS: be able to walk for a period of time with little to  no pain in my back. Dtr wants mother to improve gait.  NEXT MD VISIT: as needed  OBJECTIVE:  Note: Objective measures were completed at Evaluation unless otherwise noted.  DIAGNOSTIC FINDINGS:  03/11/24 IMPRESSION: Moderate degenerative joint disease of right hip. No acute abnormality seen.  PATIENT SURVEYS:  Modified Oswestry 23/50=46%   COGNITION: Overall cognitive status: Within functional limits for tasks assessed     SENSATION: WFL  MUSCLE LENGTH: Hamstrings: mild restriction on B Gastrocnemius: moderate restriction on B   POSTURE: rounded shoulders, forward head, and flexed trunk (standing).  Left pelvic obliquity   LUMBAR ROM:   AROM eval  Flexion full  Extension 50% limited P!  Right lateral flexion P!  Left lateral flexion 50% limited P!  Right rotation   Left rotation    (Blank rows = not tested)    LOWER EXTREMITY MMT:    MMT Right eval Left eval  Hip flexion 3+ 3+  Hip extension    Hip abduction 4+ 4+  Hip adduction 5 5  Hip internal rotation    Hip external rotation    Knee flexion 3+ 3+  Knee extension 3+ 3+  Ankle dorsiflexion    Ankle plantarflexion    Ankle inversion    Ankle eversion     (Blank rows = not tested)    FUNCTIONAL TESTS:  30 seconds chair stand test: 3 from pool bench with ue support Timed up and go (TUG): >30s BERG Balance Test          Date:   Sit to Stand 3  Standing unsupported 4  Sitting with back unsupported but feet supported 4  Stand to sit  2  Transfers  3  Standing unsupported with eyes closed 3  Standing unsupported feet together 3  From standing position, reach forward with outstretched arm 3  From standing position, pick up object from floor 3  From standing position, turn and look behind over each shoulder 1  Turn 360 2  Standing unsupported,  alternately place foot on step 0  Standing  unsupported, one foot in front 0  Standing on one leg 0  Total:  31/56     GAIT: Distance walked: 400 Assistive device utilized: Personal assistant bench Level of assistance: Modified independence Comments: guarded, reduced step length and cadence, decreased heel strike and toe off  TREATMENT  [facility] Adult PT Treatment:                                                DATE: 11/27/23 Pt seen for aquatic therapy today.  Treatment took place in water 3.5-4.75 ft in depth at the [facility] pool. Temp of water was 91.  Pt entered/exited the pool via stairs using step to and alternating pattern with hand rail.  *Intro to setting *stair negotiation using ue on hand rails using an alternating pattern descending then (when exiting) step to pattern ascending *Ue support on wall: toe raises; heel raises; hip add/abd (few reps due to LB discomfort); hip extension; relaxed squats *seated on lift: cycling; hip add/abd; LAQ *side stepping length of wall to 4.3 ft ue support on wall->repeated ue support barbell *forward amb 1/2 width then full width of pool using barbel.  Some unsteadiness which she recovers from    Pt requires the buoyancy and hydrostatic pressure of water for support, and to offload joints by unweighting joint load by at least 50 % in navel deep water and by at least 75-80% in chest to neck deep water.  Viscosity of the water is needed for resistance of strengthening. Water current perturbations provides challenge to standing balance requiring increased core activation.                                                                        PATIENT EDUCATION:  Education details: Discussed eval findings, rehab rationale, aquatic program progression/POC and pools in area. Patient is in agreement  Person educated: Patient Education method: Chief Technology Officer Education comprehension: verbalized understanding  HOME  EXERCISE PROGRAM: Walking daily   ASSESSMENT:  CLINICAL IMPRESSION: Pt demonstrates safety and independence in aquatic setting with therapist instructing from deck.   She has some apprehension in setting, moving along wall initially.  May need therapist in water going forward but completes without difficulty today. Pt is directed through various movement patterns and trials in both sitting and standing positions.  Focus of session is to familiarize pt with submersion and how the properties of water will effect her movement. Over time pt able to walk across pool with barbell forward the 1/2 way backward.  She is given multiple rest periods throughout. Pt puts forth good effort and enjoys session.  She and dtr (Tish) are instructed that she may have some muscle soreness post session.  Goals are ongoing.     Initial Impression Patient is a 89 y.o. f who was seen today for physical therapy evaluation and treatment for LBP. She sustained a compression fx 5 years ago which continues to cause pain. As per pt, she is afraid of falling and because of that has been very sedentary. Has not had a fall in past  year.  Main goals are to improve gait, toleration to amb and improve strength for overall improved function/mobility and health.  Exam indicates decreased strength throughout LE and core, balance and proprioceptive deficits with increased risk of falls, Left pelvic obliquity and gait deviation.  She will benefit from skilled physical therapy to improve all areas of deficits as well as QOL.  OBJECTIVE IMPAIRMENTS: Abnormal gait, decreased activity tolerance, decreased balance, decreased coordination, decreased knowledge of use of DME, decreased mobility, difficulty walking, decreased strength, postural dysfunction, and pain.   ACTIVITY LIMITATIONS: carrying, lifting, bending, standing, squatting, stairs, transfers, and locomotion level  PARTICIPATION LIMITATIONS: meal prep, cleaning, shopping, and community  activity  PERSONAL FACTORS: Age, Fitness, Past/current experiences, and Time since onset of injury/illness/exacerbation are also affecting patient's functional outcome.   REHAB POTENTIAL: Good  CLINICAL DECISION MAKING: Evolving/moderate complexity  EVALUATION COMPLEXITY: Moderate   GOALS: Goals reviewed with patient? Yes  SHORT TERM GOALS: Target date: 12/08/23  Pt will tolerate full aquatic sessions consistently without increase in pain and with improving function to demonstrate good toleration and effectiveness of intervention.  Baseline: Goal status:INITIAL  2.  Pt will tolerate stair climbing using combination of an alternating and step to pattern ascending and descending 6 steps without use of handrail Baseline:  Goal status: INITIAL  3.  Pt will report reduction of pain submerged to 0/10 Baseline:  Goal status: INITIAL  4.  Pt will begin amb routinely 2-3 x weekly with daughter for exercise as HEP. Baseline:  Goal status: INITIAL   LONG TERM GOALS: Target date: 02/07/24  Pt to improve on ODI by 13-15 (MCID)  % to demonstrate statistically significant Improvement in function. Baseline:Modified Oswestry 23/50=46%  Goal status: INITIAL   2. Pt will amb x1000 ft without limitation to pain using ad Baseline:  Goal status: INITIAL   3.  Pt will improve on Berg balance test to >/= 40/56 to demonstrate a decrease in fall risk. Baseline: 31/56 Goal status: INITIAL  4.  Pt will improve strength in all areas listed by 1 grade to demonstrate improved overall physical function Baseline:  Goal status: INITIAL  5.  Pt will demonstrate improved gait pattern with more normal step length with heel strike and toe off each step Baseline:  Goal status: INITIAL  6.  Pt will improve on Tug test to <or= 21s (avg for older adults) to demonstrate improvement in lower extremity function, mobility and decreased fall risk. Baseline:  Goal status: INITIAL  PLAN:  PT FREQUENCY:  2x/week  PT DURATION: 8 weeks  PLANNED INTERVENTIONS: 97164- PT Re-evaluation, 97110-Therapeutic exercises, 97530- Therapeutic activity, 97112- Neuromuscular re-education, 97535- Self Care, 16109- Manual therapy, [phone]- Gait training, [phone]- Aquatic Therapy, [phone]- Electrical stimulation (unattended), [phone]- Ionotophoresis 4mg /ml Dexamethasone, Patient/Family education, Balance training, Stair training, Taping, Dry Needling, Joint mobilization, DME instructions, Cryotherapy, and Moist heat.  PLAN FOR NEXT SESSION: general core and LE strengthening, balance retraining, gait, AD instruction, pain managemen.  Begin aquatics only, transition land after a few weeks   Adriana Hopping Laneta Pintos) Ailen Strauch MPT 11/27/23 3:31 PM [facility] [address] [place], [place], [address] Phone: [phone]   Fax:  [phone]

## 2023-11-28 ENCOUNTER — Ambulatory Visit: Admitting: Internal Medicine

## 2023-11-30 IMAGING — MG MM DIGITAL SCREENING BILAT W/ TOMO AND CAD
8 series · 8 of 24 positions shown · non-contrast
Comparison: Previous exam(s).

CLINICAL DATA: Screening.

EXAM:
DIGITAL SCREENING BILATERAL MAMMOGRAM WITH TOMOSYNTHESIS AND CAD
TECHNIQUE: Bilateral screening digital craniocaudal and mediolateral oblique
mammograms were obtained. Bilateral screening digital breast
tomosynthesis was performed. The images were evaluated with
computer-aided detection.

[L CC synth-2D]
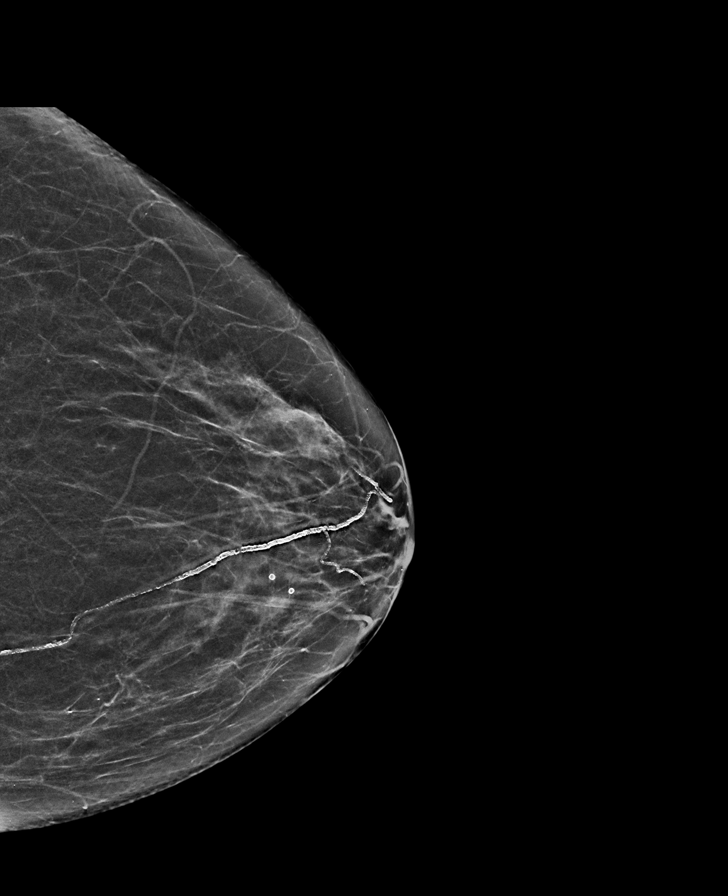

[L MLO synth-2D]
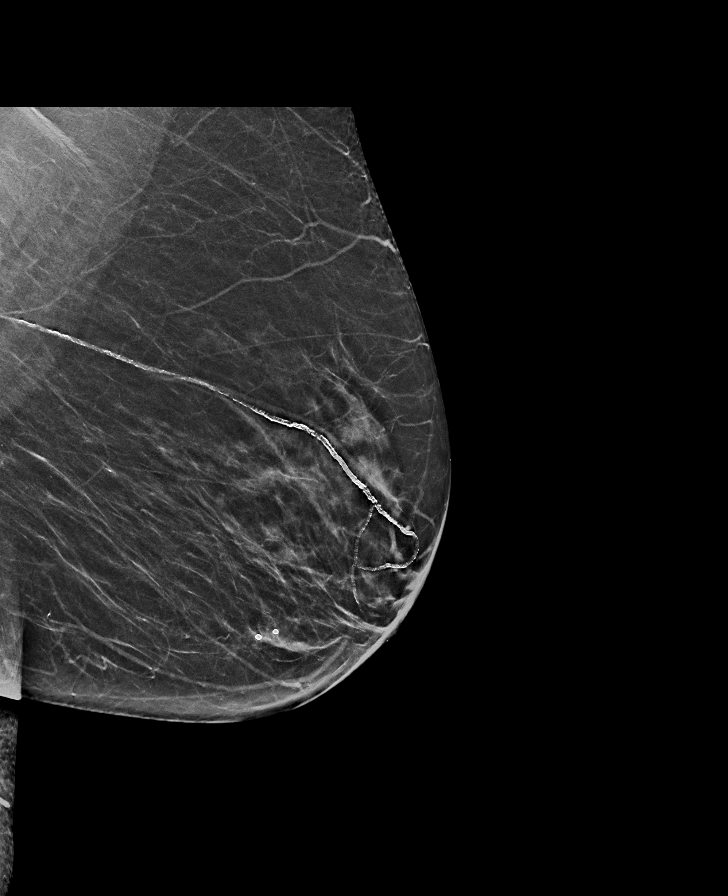

[R MLO synth-2D]
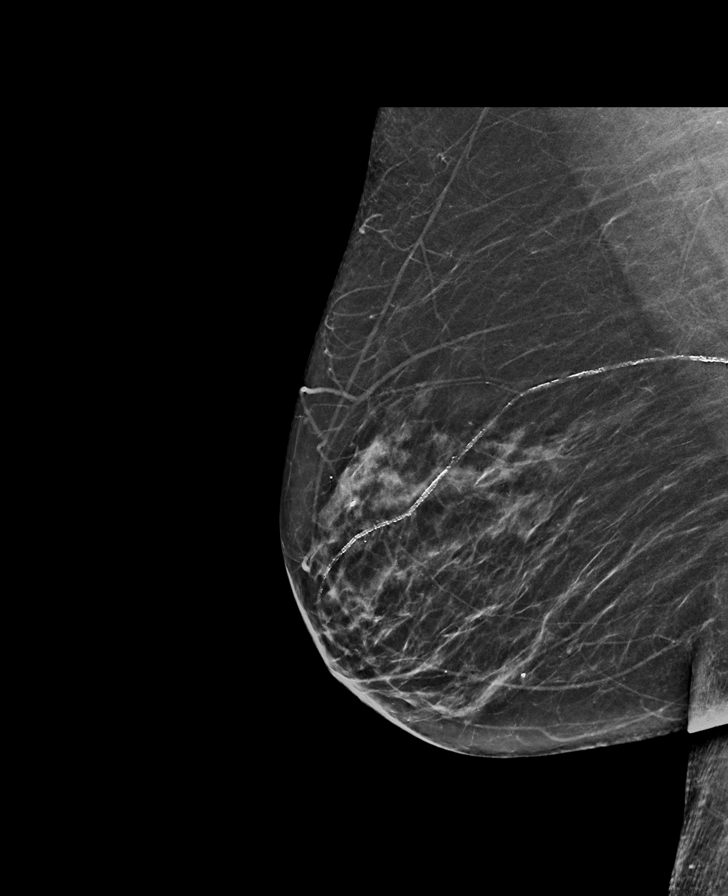

[R CC synth-2D]
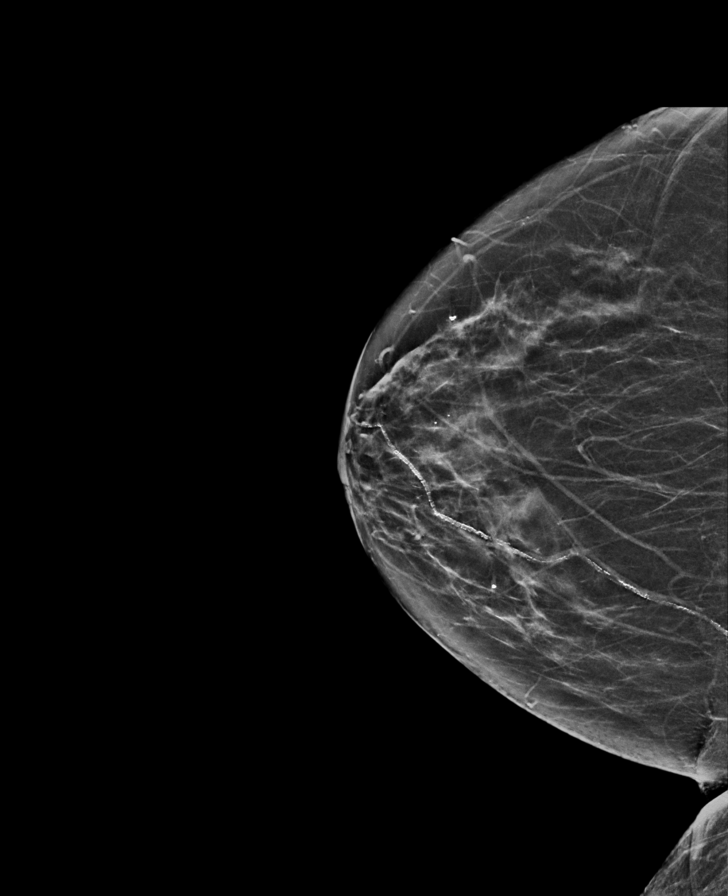

[R CC tomo · tomo slice 31/60.0]
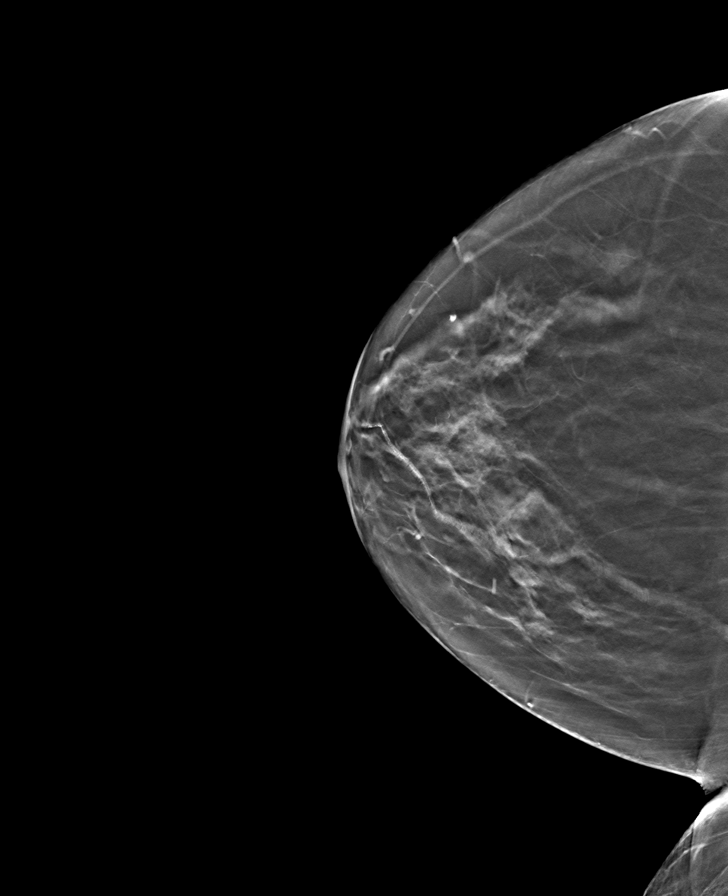

[L CC tomo · tomo slice 29/57.0]
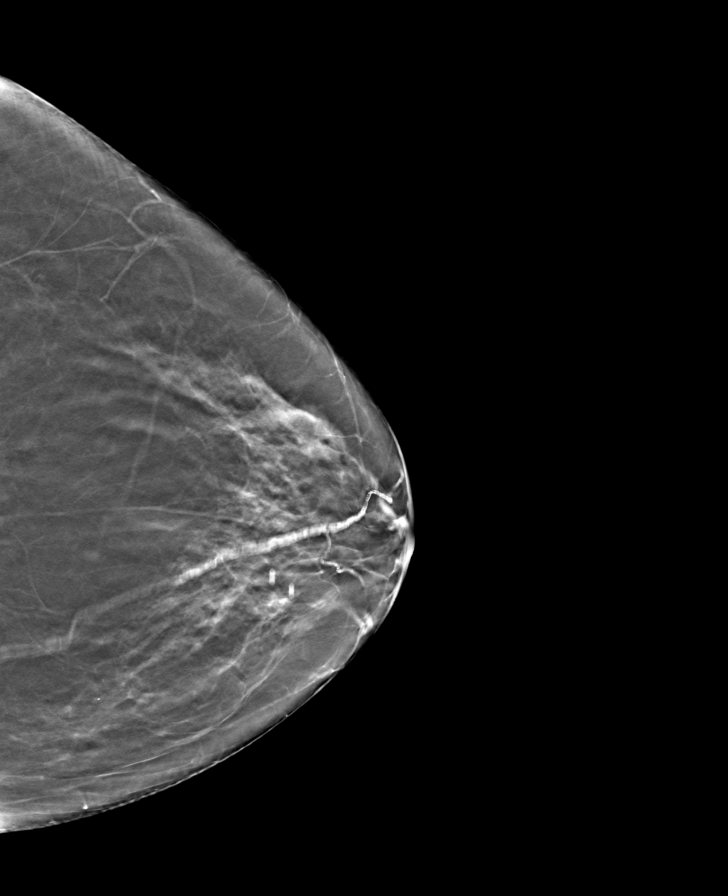

[L MLO tomo · tomo slice 30/59.0]
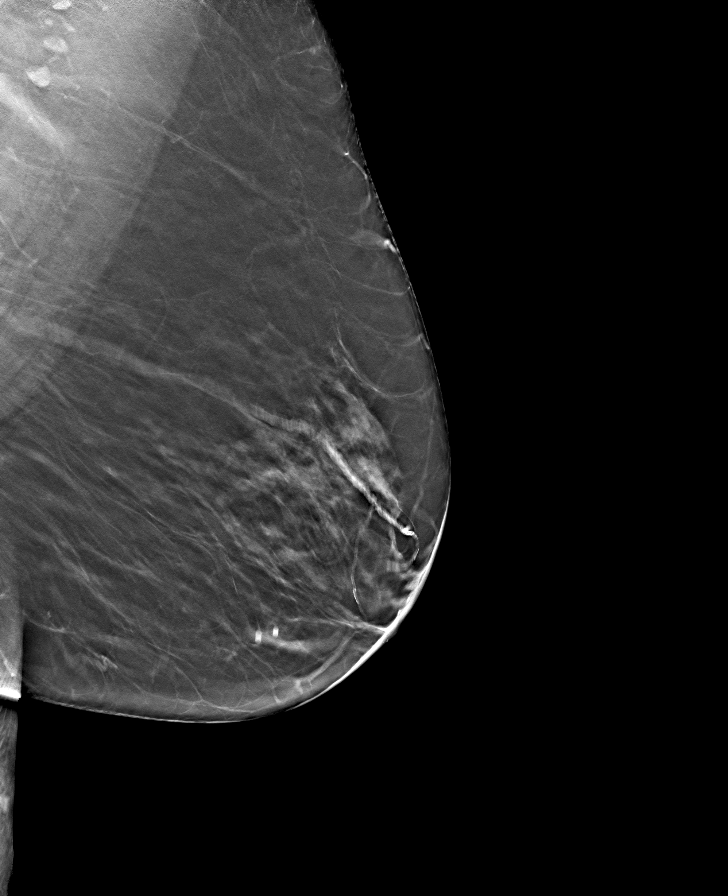

[R MLO tomo · tomo slice 31/60.0]
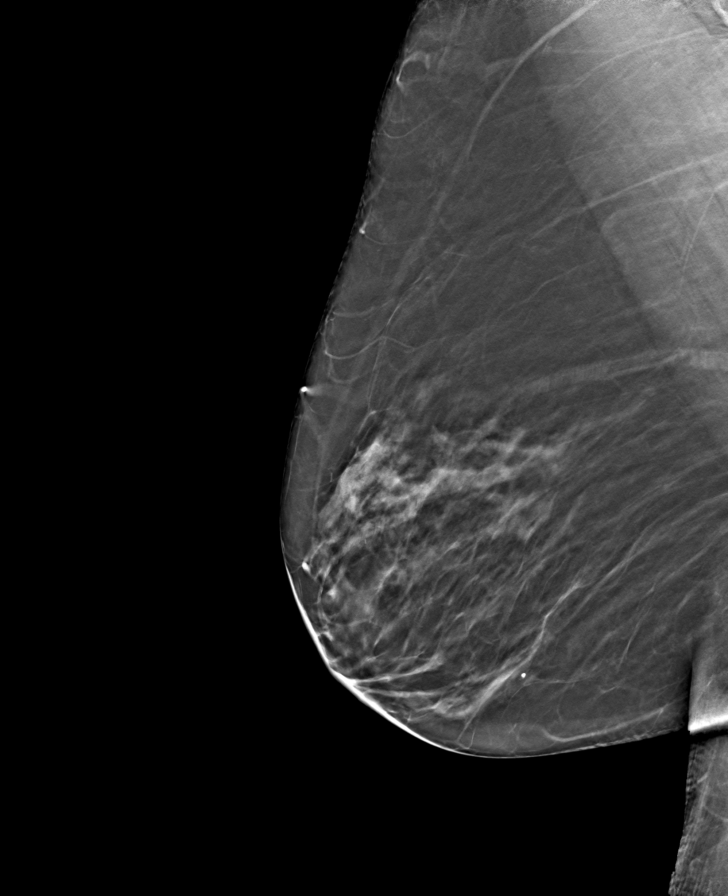

[8 of 24 positions shown; findings below may reference images not displayed]

ACR Breast Density Category b: There are scattered areas of
fibroglandular density.
FINDINGS: There are no findings suspicious for malignancy.
IMPRESSION: No mammographic evidence of malignancy. A result letter of this
screening mammogram will be mailed directly to the patient.

RECOMMENDATION:
Screening mammogram in one year. (Code:51-O-LD2)

BI-RADS CATEGORY  1: Negative.

## 2023-12-05 ENCOUNTER — Other Ambulatory Visit: Payer: Self-pay | Admitting: Internal Medicine

## 2023-12-06 ENCOUNTER — Ambulatory Visit (HOSPITAL_BASED_OUTPATIENT_CLINIC_OR_DEPARTMENT_OTHER): Admitting: Physical Therapy

## 2023-12-06 ENCOUNTER — Encounter (HOSPITAL_BASED_OUTPATIENT_CLINIC_OR_DEPARTMENT_OTHER): Payer: Self-pay | Admitting: Physical Therapy

## 2023-12-06 DIAGNOSIS — M5459 Other low back pain: Secondary | ICD-10-CM

## 2023-12-06 DIAGNOSIS — R2681 Unsteadiness on feet: Secondary | ICD-10-CM

## 2023-12-06 DIAGNOSIS — M25551 Pain in right hip: Secondary | ICD-10-CM

## 2023-12-06 NOTE — Therapy (Signed)
 OUTPATIENT PHYSICAL THERAPY THORACOLUMBAR EVALUATION   Patient Name: Julie Park MRN: [id number] DOB:02/07/1934, 89 y.o., female Today's Date: 12/06/2023  END OF SESSION:  PT End of Session - 12/06/23 1150     Visit Number 3    Number of Visits 16    Date for PT Re-Evaluation 02/07/24    Authorization Type [employer]    PT Start Time 1147    PT Stop Time 1225    PT Time Calculation (min) 38 min    Activity Tolerance Patient tolerated treatment well    Behavior During Therapy [facility] for tasks assessed/performed          Past Medical History:  Diagnosis Date   Anxiety    Chest pain 02/08/15   ETT normal   Diverticulosis    Diverticulitis (1982)   Fracture of rib of right side 08/16/2013   GERD (gastroesophageal reflux disease)    Hyperlipidemia, mixed    Hypothyroidism    IBS (irritable bowel syndrome)    Morton's neuroma    Right lumbar radiculopathy    Intermittent x 2 episodes in summer 2014   Shingles 1967 and 2010   Ulcer    Urine incontinence    Vertigo    Past Surgical History:  Procedure Laterality Date   CARDIOVASCULAR STRESS TEST  02/08/15   ETT normal   CATARACT EXTRACTION  2012   Dr. Lasandra Points   COLONOSCOPY  2005   Dr. Hercules Lombard at [facility] (normal per pt report)   DEXA  11/03/13   Heel T score -0.8.   FOOT NEUROMA SURGERY  1994   right foot   TONSILLECTOMY     TONSILLECTOMY AND ADENOIDECTOMY  1948   Dr. Eppie Hasting [facility]   TUBAL LIGATION  [phone]   WISDOM TOOTH EXTRACTION     Patient Active Problem List   Diagnosis Date Noted   Right hip pain 03/12/2023   Cardiac chest pain 08/20/2022   Lower extremity pain 08/20/2022   Gait abnormality 04/25/2021   Nevus of choroid of right eye 08/24/2019   Posterior vitreous detachment of right eye 08/24/2019   Pseudophakia of both eyes 08/24/2019   GAD (generalized anxiety disorder) 07/11/2018   Overactive bladder 07/11/2018   Functional urinary incontinence 07/10/2018   Hyponatremia 03/31/2014   HLD  (hyperlipidemia)    Diverticular disease    Anxiety state    GERD (gastroesophageal reflux disease)    Hypothyroidism    Urine incontinence    IRRITABLE BOWEL SYNDROME 03/02/2010    PCP: Arnetha Bhat, NP   REFERRING PROVIDER: Arnetha Bhat, NP   REFERRING DIAG: M54.41,M54.42,G89.29 (ICD-10-CM) - Chronic midline low back pain with bilateral sciatica   Rationale for Evaluation and Treatment: Rehabilitation  THERAPY DIAG:  Other low back pain  Unsteadiness on feet  Pain in right hip  ONSET DATE: 2020  SUBJECTIVE:  SUBJECTIVE STATEMENT: Pt reports she got into the pool on vacation and walked for at least 1/2 hour.  No pain in hip   Initial Subjective Picked up a case of waters in 2020 and had compression fx  (T10 or 12) which has healed but pain has been consistent since then.  I also have oa right hip which hurts.  I am afraid of falling.  I don't move around much in my home.  Use a cane when I lave the house.  As long as I can have 2 fingers on the wall or sofa I can get around my house pretty good.  PERTINENT HISTORY:  Thoracic compression fx 2020 Oa hip right  PAIN:  Are you having pain? Yes: NPRS scale: 0/10 LB Pain location: mid to lower back Pain description: ache Aggravating factors: walking 10 minutes Relieving factors: resting lying down  PRECAUTIONS: Fall  RED FLAGS: None   WEIGHT BEARING RESTRICTIONS: No  FALLS:  Has patient fallen in last 6 months? No  LIVING ENVIRONMENT: Lives with: lives with their family Lives in: House/apartment Stairs: Yes: Internal: 12-14 steps; on right going up and on left going up and External: 0 steps; none Has following equipment at home: Quad cane large base, Environmental consultant - 4 wheeled, Marine scientist  OCCUPATION: retired  PLOF:  Insurance account manager touch with walking through home    PATIENT GOALS: be able to walk for a period of time with little to  no pain in my back. Dtr wants mother to improve gait.  NEXT MD VISIT: as needed  OBJECTIVE:  Note: Objective measures were completed at Evaluation unless otherwise noted.  DIAGNOSTIC FINDINGS:  03/11/24 IMPRESSION: Moderate degenerative joint disease of right hip. No acute abnormality seen.  PATIENT SURVEYS:  Modified Oswestry 23/50=46%   COGNITION: Overall cognitive status: Within functional limits for tasks assessed     SENSATION: WFL  MUSCLE LENGTH: Hamstrings: mild restriction on B Gastrocnemius: moderate restriction on B   POSTURE: rounded shoulders, forward head, and flexed trunk (standing).  Left pelvic obliquity   LUMBAR ROM:   AROM eval  Flexion full  Extension 50% limited P!  Right lateral flexion P!  Left lateral flexion 50% limited P!  Right rotation   Left rotation    (Blank rows = not tested)    LOWER EXTREMITY MMT:    MMT Right eval Left eval  Hip flexion 3+ 3+  Hip extension    Hip abduction 4+ 4+  Hip adduction 5 5  Hip internal rotation    Hip external rotation    Knee flexion 3+ 3+  Knee extension 3+ 3+  Ankle dorsiflexion    Ankle plantarflexion    Ankle inversion    Ankle eversion     (Blank rows = not tested)    FUNCTIONAL TESTS:  30 seconds chair stand test: 3 from pool bench with ue support Timed up and go (TUG): >30s BERG Balance Test          Date:   Sit to Stand 3  Standing unsupported 4  Sitting with back unsupported but feet supported 4  Stand to sit  2  Transfers  3  Standing unsupported with eyes closed 3  Standing unsupported feet together 3  From standing position, reach forward with outstretched arm 3  From standing position, pick up object from floor 3  From standing position, turn and look behind over each shoulder 1  Turn 360 2  Standing unsupported, alternately place foot  on  step 0  Standing unsupported, one foot in front 0  Standing on one leg 0  Total:  31/56     GAIT: Distance walked: 400 Assistive device utilized: Personal assistant bench Level of assistance: Modified independence Comments: guarded, reduced step length and cadence, decreased heel strike and toe off  TREATMENT  [facility] Adult PT Treatment:                                                DATE: 11/27/23 Pt seen for aquatic therapy today.  Treatment took place in water 3.5-4.75 ft in depth at the [facility] pool. Temp of water was 91.  Pt entered/exited the pool via stairs using step to and alternating pattern with hand rail.  *stair negotiation using ue on hand rails using an alternating pattern descending then (when exiting) step to pattern ascending *forward and backward and side stepping amb ue support barbel multiple widths *Forward marching x 2 widths *Forward march with knee kick x 2 widths (good balance challenge) *UE support barbell 3.6 ft: toe raises; heel raises; hip add/abd x 10.  - seated rest period *seated on lift: cycling; hip add/abd; LAQ  -seated rest period   Pt requires the buoyancy and hydrostatic pressure of water for support, and to offload joints by unweighting joint load by at least 50 % in navel deep water and by at least 75-80% in chest to neck deep water.  Viscosity of the water is needed for resistance of strengthening. Water current perturbations provides challenge to standing balance requiring increased core activation.                                                                        PATIENT EDUCATION:  Education details: Discussed eval findings, rehab rationale, aquatic program progression/POC and pools in area. Patient is in agreement  Person educated: Patient Education method: Chief Technology Officer Education comprehension: verbalized understanding  HOME EXERCISE PROGRAM: Walking daily   ASSESSMENT:  CLINICAL  IMPRESSION: Pt reports good response from last/first aquatic session without residual pian or excessive fatigue.  She has good toleration today of progressed strengthening and balance challenges demonstrating good SLS with opposite le exercise.  She demonstrates improved confidence in setting moving easily through 3.22ft - 4.0 with ue support, no LOB.  She is given VC and demonstration for execution of exercise and pacing. She requires at least 2 resting periods for recovery.     Initial Impression Patient is a 89 y.o. f who was seen today for physical therapy evaluation and treatment for LBP. She sustained a compression fx 5 years ago which continues to cause pain. As per pt, she is afraid of falling and because of that has been very sedentary. Has not had a fall in past year.  Main goals are to improve gait, toleration to amb and improve strength for overall improved function/mobility and health.  Exam indicates decreased strength throughout LE and core, balance and proprioceptive deficits with increased risk of falls, Left pelvic obliquity and gait deviation.  She will benefit from skilled physical therapy  to improve all areas of deficits as well as QOL.  OBJECTIVE IMPAIRMENTS: Abnormal gait, decreased activity tolerance, decreased balance, decreased coordination, decreased knowledge of use of DME, decreased mobility, difficulty walking, decreased strength, postural dysfunction, and pain.   ACTIVITY LIMITATIONS: carrying, lifting, bending, standing, squatting, stairs, transfers, and locomotion level  PARTICIPATION LIMITATIONS: meal prep, cleaning, shopping, and community activity  PERSONAL FACTORS: Age, Fitness, Past/current experiences, and Time since onset of injury/illness/exacerbation are also affecting patient's functional outcome.   REHAB POTENTIAL: Good  CLINICAL DECISION MAKING: Evolving/moderate complexity  EVALUATION COMPLEXITY: Moderate   GOALS: Goals reviewed with patient?  Yes  SHORT TERM GOALS: Target date: 12/08/23  Pt will tolerate full aquatic sessions consistently without increase in pain and with improving function to demonstrate good toleration and effectiveness of intervention.  Baseline: Goal status:INITIAL  2.  Pt will tolerate stair climbing using combination of an alternating and step to pattern ascending and descending 6 steps without use of handrail Baseline:  Goal status: INITIAL  3.  Pt will report reduction of pain submerged to 0/10 Baseline:  Goal status: INITIAL  4.  Pt will begin amb routinely 2-3 x weekly with daughter for exercise as HEP. Baseline:  Goal status: INITIAL   LONG TERM GOALS: Target date: 02/07/24  Pt to improve on ODI by 13-15 (MCID)  % to demonstrate statistically significant Improvement in function. Baseline:Modified Oswestry 23/50=46%  Goal status: INITIAL   2. Pt will amb x1000 ft without limitation to pain using ad Baseline:  Goal status: INITIAL   3.  Pt will improve on Berg balance test to >/= 40/56 to demonstrate a decrease in fall risk. Baseline: 31/56 Goal status: INITIAL  4.  Pt will improve strength in all areas listed by 1 grade to demonstrate improved overall physical function Baseline:  Goal status: INITIAL  5.  Pt will demonstrate improved gait pattern with more normal step length with heel strike and toe off each step Baseline:  Goal status: INITIAL  6.  Pt will improve on Tug test to <or= 21s (avg for older adults) to demonstrate improvement in lower extremity function, mobility and decreased fall risk. Baseline:  Goal status: INITIAL  PLAN:  PT FREQUENCY: 2x/week  PT DURATION: 8 weeks  PLANNED INTERVENTIONS: 97164- PT Re-evaluation, 97110-Therapeutic exercises, 97530- Therapeutic activity, 97112- Neuromuscular re-education, 97535- Self Care, 40981- Manual therapy, [phone]- Gait training, [phone]- Aquatic Therapy, [phone]- Electrical stimulation (unattended), [phone]- Ionotophoresis  4mg /ml Dexamethasone, Patient/Family education, Balance training, Stair training, Taping, Dry Needling, Joint mobilization, DME instructions, Cryotherapy, and Moist heat.  PLAN FOR NEXT SESSION: general core and LE strengthening, balance retraining, gait, AD instruction, pain managemen.  Begin aquatics only, transition land after a few weeks   Adriana Hopping Laneta Pintos) [place] Rommel MPT 12/06/23 11:54 AM [facility] [address] [place], [place], [address] Phone: [phone]   Fax:  [phone]

## 2023-12-09 ENCOUNTER — Ambulatory Visit (HOSPITAL_COMMUNITY)

## 2023-12-10 ENCOUNTER — Ambulatory Visit (HOSPITAL_BASED_OUTPATIENT_CLINIC_OR_DEPARTMENT_OTHER): Admitting: Physical Therapy

## 2023-12-10 DIAGNOSIS — M6281 Muscle weakness (generalized): Secondary | ICD-10-CM

## 2023-12-10 DIAGNOSIS — M5459 Other low back pain: Secondary | ICD-10-CM

## 2023-12-10 DIAGNOSIS — R2681 Unsteadiness on feet: Secondary | ICD-10-CM

## 2023-12-10 DIAGNOSIS — M25551 Pain in right hip: Secondary | ICD-10-CM

## 2023-12-10 NOTE — Therapy (Signed)
 OUTPATIENT PHYSICAL THERAPY THORACOLUMBAR TREATMENT   Patient Name: Julie Park MRN: [id number] DOB:[birth year], 89 y.o., female Today's Date: 12/10/2023  END OF SESSION:  PT End of Session - 12/10/23 1413     Visit Number 4    Number of Visits 16    Date for PT Re-Evaluation 02/07/24    Authorization Type [employer]    PT Start Time 1401    PT Stop Time 1439    PT Time Calculation (min) 38 min    Behavior During Therapy [facility] for tasks assessed/performed          Past Medical History:  Diagnosis Date   Anxiety    Chest pain 02/08/15   ETT normal   Diverticulosis    Diverticulitis (1982)   Fracture of rib of right side 08/16/2013   GERD (gastroesophageal reflux disease)    Hyperlipidemia, mixed    Hypothyroidism    IBS (irritable bowel syndrome)    Morton's neuroma    Right lumbar radiculopathy    Intermittent x 2 episodes in summer 2014   Shingles 1967 and 2010   Ulcer    Urine incontinence    Vertigo    Past Surgical History:  Procedure Laterality Date   CARDIOVASCULAR STRESS TEST  02/08/15   ETT normal   CATARACT EXTRACTION  2012   Dr. Lasandra Points   COLONOSCOPY  2005   [facility] Hercules Lombard at [facility] (normal per pt report)   DEXA  11/03/13   Heel T score -0.8.   FOOT NEUROMA SURGERY  1994   right foot   TONSILLECTOMY     TONSILLECTOMY AND ADENOIDECTOMY  1948   Dr. Eppie Hasting [facility]   TUBAL LIGATION  [phone]   WISDOM TOOTH EXTRACTION     Patient Active Problem List   Diagnosis Date Noted   Right hip pain 03/12/2023   Cardiac chest pain 08/20/2022   Lower extremity pain 08/20/2022   Gait abnormality 04/25/2021   Nevus of choroid of right eye 08/24/2019   Posterior vitreous detachment of right eye 08/24/2019   Pseudophakia of both eyes 08/24/2019   GAD (generalized anxiety disorder) 07/11/2018   Overactive bladder 07/11/2018   Functional urinary incontinence 07/10/2018   Hyponatremia 03/31/2014   HLD (hyperlipidemia)    Diverticular disease    Anxiety  state    GERD (gastroesophageal reflux disease)    Hypothyroidism    Urine incontinence    IRRITABLE BOWEL SYNDROME 03/02/2010    PCP: Arnetha Bhat, NP   REFERRING PROVIDER: Arnetha Bhat, NP   REFERRING DIAG: M54.41,M54.42,G89.29 (ICD-10-CM) - Chronic midline low back pain with bilateral sciatica   Rationale for Evaluation and Treatment: Rehabilitation  THERAPY DIAG:  Other low back pain  Unsteadiness on feet  Pain in right hip  Muscle weakness (generalized)  ONSET DATE: 2020  SUBJECTIVE:  SUBJECTIVE STATEMENT: Pt reports she felt rejuvenated after last session.  She reports she did have some pain with the noodle pull downs.   POOL ACCESS: neighbor has pool.  She may consider joining [employer]  Initial Subjective Picked up a case of waters in 2020 and had compression fx  (T10 or 12) which has healed but pain has been consistent since then.  I also have oa right hip which hurts.  I am afraid of falling.  I don't move around much in my home.  Use a cane when I lave the house.  As long as I can have 2 fingers on the wall or sofa I can get around my house pretty good.  PERTINENT HISTORY:  Thoracic compression fx 2020 Oa hip right  PAIN:  Are you having pain? no: NPRS scale: 0/10 LB Pain location: mid to lower back Pain description: a Aggravating factors: walking 10 minutes Relieving factors: resting lying down  PRECAUTIONS: Fall  RED FLAGS: None   WEIGHT BEARING RESTRICTIONS: No  FALLS:  Has patient fallen in last 6 months? No  LIVING ENVIRONMENT: Lives with: lives with their family Lives in: House/apartment Stairs: Yes: Internal: 12-14 steps; on right going up and on left going up and External: 0 steps; none Has following equipment at home: Quad cane large base, Environmental consultant  - 4 wheeled, Marine scientist  OCCUPATION: retired  PLOF: Insurance account manager touch with walking through home    PATIENT GOALS: be able to walk for a period of time with little to  no pain in my back. Dtr wants mother to improve gait.  NEXT MD VISIT: as needed  OBJECTIVE:  Note: Objective measures were completed at Evaluation unless otherwise noted.  DIAGNOSTIC FINDINGS:  03/11/24 IMPRESSION: Moderate degenerative joint disease of right hip. No acute abnormality seen.  PATIENT SURVEYS:  Modified Oswestry 23/50=46%   COGNITION: Overall cognitive status: Within functional limits for tasks assessed     SENSATION: WFL  MUSCLE LENGTH: Hamstrings: mild restriction on B Gastrocnemius: moderate restriction on B   POSTURE: rounded shoulders, forward head, and flexed trunk (standing).  Left pelvic obliquity   LUMBAR ROM:   AROM eval  Flexion full  Extension 50% limited P!  Right lateral flexion P!  Left lateral flexion 50% limited P!  Right rotation   Left rotation    (Blank rows = not tested)    LOWER EXTREMITY MMT:    MMT Right eval Left eval  Hip flexion 3+ 3+  Hip extension    Hip abduction 4+ 4+  Hip adduction 5 5  Hip internal rotation    Hip external rotation    Knee flexion 3+ 3+  Knee extension 3+ 3+  Ankle dorsiflexion    Ankle plantarflexion    Ankle inversion    Ankle eversion     (Blank rows = not tested)    FUNCTIONAL TESTS:  30 seconds chair stand test: 3 from pool bench with ue support Timed up and go (TUG): >30s BERG Balance Test          Date:   Sit to Stand 3  Standing unsupported 4  Sitting with back unsupported but feet supported 4  Stand to sit  2  Transfers  3  Standing unsupported with eyes closed 3  Standing unsupported feet together 3  From standing position, reach forward with outstretched arm 3  From standing position, pick up object from floor 3  From standing position, turn and look behind over  each shoulder 1   Turn 360 2  Standing unsupported, alternately place foot on step 0  Standing unsupported, one foot in front 0  Standing on one leg 0  Total:  31/56     GAIT: Distance walked: 400 Assistive device utilized: Quad cane small base and Shower bench Level of assistance: Modified independence Comments: guarded, reduced step length and cadence, decreased heel strike and toe off  TREATMENT  [facility] Adult PT Treatment:                                                DATE: 12/10/23 Pt seen for aquatic therapy today.  Treatment took place in water 3.5-4.75 ft in depth at the [facility] pool. Temp of water was 91.  Pt entered/exited the pool via stairs using step to and alternating pattern with hand rail.  *UE on yellow hand floats: walking forward and backward, multiple widths, cues for vertical trunk and relaxed arms *UE on yellow hand floats: Forward/backward marching x 1 lap * side stepping with arm abdct/ addct -> with rainbow hand floats (difficulty coordinating initially) *Forward march with knee kick x 2 widths (good balance challenge) *UE support on wall: heel raises x 12 * TrA set with 1/2 hollow noodle pull down to thighs x 12 * tandem gait forward/ backward with intermittent UE on wall to steady * STS from bench in water with feet on blue step x 5   Pt requires the buoyancy and hydrostatic pressure of water for support, and to offload joints by unweighting joint load by at least 50 % in navel deep water and by at least 75-80% in chest to neck deep water.  Viscosity of the water is needed for resistance of strengthening. Water current perturbations provides challenge to standing balance requiring increased core activation.                                                                        PATIENT EDUCATION:  Education details: intro to aquatic therapy  Person educated: Patient Education method: Explanation  Education comprehension: verbalized understanding  HOME  EXERCISE PROGRAM: Walking daily   ASSESSMENT:  CLINICAL IMPRESSION: Pt did not require any rest breaks this visit.  Tolerated 1/2 noodle pull down to thighs with greater ease today.  She tolerated session without increase in symptoms. Tandem gait is challenging but improved with repetition. Pt has met STG1.      Initial Impression Patient is a 89 y.o. f who was seen today for physical therapy evaluation and treatment for LBP. She sustained a compression fx 5 years ago which continues to cause pain. As per pt, she is afraid of falling and because of that has been very sedentary. Has not had a fall in past year.  Main goals are to improve gait, toleration to amb and improve strength for overall improved function/mobility and health.  Exam indicates decreased strength throughout LE and core, balance and proprioceptive deficits with increased risk of falls, Left pelvic obliquity and gait deviation.  She will benefit from skilled physical therapy to improve all areas of deficits as  well as QOL.  OBJECTIVE IMPAIRMENTS: Abnormal gait, decreased activity tolerance, decreased balance, decreased coordination, decreased knowledge of use of DME, decreased mobility, difficulty walking, decreased strength, postural dysfunction, and pain.   ACTIVITY LIMITATIONS: carrying, lifting, bending, standing, squatting, stairs, transfers, and locomotion level  PARTICIPATION LIMITATIONS: meal prep, cleaning, shopping, and community activity  PERSONAL FACTORS: Age, Fitness, Past/current experiences, and Time since onset of injury/illness/exacerbation are also affecting patient's functional outcome.   REHAB POTENTIAL: Good  CLINICAL DECISION MAKING: Evolving/moderate complexity  EVALUATION COMPLEXITY: Moderate   GOALS: Goals reviewed with patient? Yes  SHORT TERM GOALS: Target date: 12/08/23  Pt will tolerate full aquatic sessions consistently without increase in pain and with improving function to demonstrate  good toleration and effectiveness of intervention.  Baseline: Goal status:MET -12/10/23  2.  Pt will tolerate stair climbing using combination of an alternating and step to pattern ascending and descending 6 steps without use of handrail Baseline:  Goal status: INITIAL  3.  Pt will report reduction of pain submerged to 0/10 Baseline:  Goal status: INITIAL  4.  Pt will begin amb routinely 2-3 x weekly with daughter for exercise as HEP. Baseline:  Goal status: INITIAL   LONG TERM GOALS: Target date: 02/07/24  Pt to improve on ODI by 13-15 (MCID)  % to demonstrate statistically significant Improvement in function. Baseline:Modified Oswestry 23/50=46%  Goal status: INITIAL   2. Pt will amb x1000 ft without limitation to pain using ad Baseline:  Goal status: INITIAL   3.  Pt will improve on Berg balance test to >/= 40/56 to demonstrate a decrease in fall risk. Baseline: 31/56 Goal status: INITIAL  4.  Pt will improve strength in all areas listed by 1 grade to demonstrate improved overall physical function Baseline:  Goal status: INITIAL  5.  Pt will demonstrate improved gait pattern with more normal step length with heel strike and toe off each step Baseline:  Goal status: INITIAL  6.  Pt will improve on Tug test to <or= 21s (avg for older adults) to demonstrate improvement in lower extremity function, mobility and decreased fall risk. Baseline:  Goal status: INITIAL  PLAN:  PT FREQUENCY: 2x/week  PT DURATION: 8 weeks  PLANNED INTERVENTIONS: 97164- PT Re-evaluation, 97110-Therapeutic exercises, 97530- Therapeutic activity, 97112- Neuromuscular re-education, 97535- Self Care, 81191- Manual therapy, [phone]- Gait training, [phone]- Aquatic Therapy, [phone]- Electrical stimulation (unattended), [phone]- Ionotophoresis 4mg /ml Dexamethasone, Patient/Family education, Balance training, Stair training, Taping, Dry Needling, Joint mobilization, DME instructions, Cryotherapy, and Moist  heat.  PLAN FOR NEXT SESSION: general core and LE strengthening, balance retraining, gait, AD instruction, pain managemen.  Begin aquatics only, transition land after a few weeks  Almedia Jacobsen, [place] 12/10/23 2:57 PM [facility] [address] [place], [place], [address] Phone: [phone]   Fax:  [phone]

## 2023-12-13 ENCOUNTER — Ambulatory Visit (HOSPITAL_BASED_OUTPATIENT_CLINIC_OR_DEPARTMENT_OTHER): Admitting: Physical Therapy

## 2023-12-17 ENCOUNTER — Ambulatory Visit (HOSPITAL_BASED_OUTPATIENT_CLINIC_OR_DEPARTMENT_OTHER): Admitting: Physical Therapy

## 2023-12-20 ENCOUNTER — Ambulatory Visit (HOSPITAL_BASED_OUTPATIENT_CLINIC_OR_DEPARTMENT_OTHER): Admitting: Physical Therapy

## 2023-12-23 ENCOUNTER — Encounter: Payer: Self-pay | Admitting: Nurse Practitioner

## 2023-12-23 ENCOUNTER — Ambulatory Visit (HOSPITAL_BASED_OUTPATIENT_CLINIC_OR_DEPARTMENT_OTHER): Admitting: Physical Therapy

## 2023-12-23 ENCOUNTER — Ambulatory Visit (INDEPENDENT_AMBULATORY_CARE_PROVIDER_SITE_OTHER): Admitting: Nurse Practitioner

## 2023-12-23 VITALS — BP 140/80 | HR 97 | Temp 96.3°F | Resp 17 | Ht 65.5 in | Wt 173.0 lb

## 2023-12-23 DIAGNOSIS — K219 Gastro-esophageal reflux disease without esophagitis: Secondary | ICD-10-CM

## 2023-12-23 DIAGNOSIS — R519 Headache, unspecified: Secondary | ICD-10-CM | POA: Diagnosis not present

## 2023-12-23 DIAGNOSIS — R112 Nausea with vomiting, unspecified: Secondary | ICD-10-CM | POA: Diagnosis not present

## 2023-12-23 DIAGNOSIS — K2900 Acute gastritis without bleeding: Secondary | ICD-10-CM

## 2023-12-23 DIAGNOSIS — E871 Hypo-osmolality and hyponatremia: Secondary | ICD-10-CM

## 2023-12-23 DIAGNOSIS — R0989 Other specified symptoms and signs involving the circulatory and respiratory systems: Secondary | ICD-10-CM | POA: Diagnosis not present

## 2023-12-23 DIAGNOSIS — E039 Hypothyroidism, unspecified: Secondary | ICD-10-CM

## 2023-12-23 DIAGNOSIS — K2970 Gastritis, unspecified, without bleeding: Secondary | ICD-10-CM | POA: Insufficient documentation

## 2023-12-23 DIAGNOSIS — I10 Essential (primary) hypertension: Secondary | ICD-10-CM | POA: Insufficient documentation

## 2023-12-23 DIAGNOSIS — F411 Generalized anxiety disorder: Secondary | ICD-10-CM

## 2023-12-23 MED ORDER — ONDANSETRON HCL 4 MG PO TABS: 4.0000 mg | ORAL_TABLET | Freq: Three times a day (TID) | ORAL | 0 refills | Status: DC | PRN

## 2023-12-23 NOTE — Assessment & Plan Note (Signed)
 Blood pressure is controlled,  on Metoprolol , Bun/creat 20/1.06 08/19/23

## 2023-12-23 NOTE — Assessment & Plan Note (Signed)
 stable on Omeprazole , Hgb 15 02/06/23

## 2023-12-23 NOTE — Assessment & Plan Note (Signed)
 on Lorazepam 

## 2023-12-23 NOTE — Progress Notes (Unsigned)
 Location:   [facility]   Place of Service:    Provider: Larwance Hark NP  Code Status: DNR Goals of Care:     12/23/2023    2:07 PM  Advanced Directives  Does Patient Have a Medical Advance Directive? Yes  Type of Estate agent of [place];Living will  Does patient want to make changes to medical advance directive? No - Patient declined  Copy of Healthcare Power of Attorney in Chart? Yes - validated most recent copy scanned in chart (See row information)     Chief Complaint  Patient presents with   Emesis    Patient complains of nausea, vomiting, headache, diarrhea, runny nose, and warmer than usual. Symptoms started Thursday 12/19/2023    HPI: Patient is a 89 y.o. female seen today for nausea, vomiting, diarrhea, runny nose. Afebrile. No cough, SOB, or chest pain.   HTN, on Metoprolol , Bun/creat 20/1.06 08/19/23  Anxiety, on Lorazepam   GERD stable on Omeprazole , Hgb 15 02/06/23  Hypothyroidism, on Levothyroxine . TSH 1.15 02/06/24     Past Medical History:  Diagnosis Date   Anxiety    Chest pain 02/08/15   ETT normal   Diverticulosis    Diverticulitis (1982)   Fracture of rib of right side 08/16/2013   GERD (gastroesophageal reflux disease)    Hyperlipidemia, mixed    Hypothyroidism    IBS (irritable bowel syndrome)    Morton's neuroma    Right lumbar radiculopathy    Intermittent x 2 episodes in summer 2014   Shingles 1967 and 2010   Ulcer    Urine incontinence    Vertigo     Past Surgical History:  Procedure Laterality Date   CARDIOVASCULAR STRESS TEST  02/08/15   ETT normal   CATARACT EXTRACTION  2012   Dr. Cleatus   COLONOSCOPY  2005   [facility]. Myriam at [facility] (normal per pt report)   DEXA  11/03/13   Heel T score -0.8.   FOOT NEUROMA SURGERY  1994   right foot   TONSILLECTOMY     TONSILLECTOMY AND ADENOIDECTOMY  1948   Dr. Neomia Benne [facility]   TUBAL LIGATION  [phone]   WISDOM TOOTH EXTRACTION      No Known  Allergies  Allergies as of 12/23/2023   No Known Allergies      Medication List        Accurate as of December 23, 2023 11:59 PM. If you have any questions, ask your nurse or doctor.          acetaminophen  325 MG tablet Commonly known as: Tylenol  Take 2 tablets (650 mg total) by mouth every 6 (six) hours as needed.   aspirin  81 MG tablet Take 81 mg by mouth every morning.   atorvastatin  10 MG tablet Commonly known as: LIPITOR TAKE 1 TABLET DAILY   Fiber Powd Take by mouth.   fluticasone  50 MCG/ACT nasal spray Commonly known as: FLONASE  SPRAY 2 SPRAYS INTO EACH NOSTRIL EVERY DAY   gabapentin  300 MG capsule Commonly known as: NEURONTIN  Take 2 capsules (600 mg total) by mouth 3 (three) times daily.   levothyroxine  100 MCG tablet Commonly known as: SYNTHROID  TAKE 1 TABLET BY MOUTH EVERY DAY   loratadine  10 MG tablet Commonly known as: CLARITIN  TAKE 1 TABLET BY MOUTH EVERY DAY   LORazepam  1 MG tablet Commonly known as: ATIVAN  TAKE 1 TABLET (1 MG TOTAL) BY MOUTH EVERY 8 (EIGHT) HOURS AS NEEDED. FOR ANXIETY   Magnesium  250  MG Tabs Take 2 tablets by mouth daily.   metoprolol  tartrate 25 MG tablet Commonly known as: LOPRESSOR  TAKE 1 TABLET BY MOUTH TWICE A DAY   mupirocin  ointment 2 % Commonly known as: BACTROBAN  Apply 1 Application topically 2 (two) times daily.   nitroGLYCERIN  0.4 MG SL tablet Commonly known as: NITROSTAT  PLACE 1 TABLET (0.4 MG TOTAL) UNDER THE TONGUE EVERY 5 (FIVE) MINUTES X 3 DOSES AS NEEDED FOR CHEST PAIN (IF NO RELIEF AFTER 3RD DOSE, PROCEED TO ED OR CALL 911).   nystatin  cream Commonly known as: MYCOSTATIN  APPLY 1 APPLICATION TOPICALLY AS NEEDED FOR DRY SKIN.   omeprazole  40 MG capsule Commonly known as: PRILOSEC TAKE 1 CAPSULE EVERY DAY   ondansetron  4 MG tablet Commonly known as: Zofran  Take 1 tablet (4 mg total) by mouth every 8 (eight) hours as needed for nausea or vomiting. Started by: Maahir Horst X Jerusalen Mateja   sodium chloride  0.65 %  nasal spray Commonly known as: OCEAN Place 1 spray into the nose as needed for congestion.   sodium chloride  1 g tablet Take 1 tablet (1 g total) by mouth 2 (two) times daily with a meal. Started by: Haik Mahoney X Jayonna Meyering   Systane Balance 0.6 % Soln Generic drug: Propylene Glycol Apply to eye daily as needed.   traMADol  50 MG tablet Commonly known as: ULTRAM  TAKE 1/2 TABLET BY MOUTH DAILY        Review of Systems:  Review of Systems  Constitutional:  Positive for appetite change and fatigue. Negative for fever.  HENT:  Positive for congestion. Negative for sinus pressure, sore throat and trouble swallowing.   Respiratory:  Negative for cough and wheezing.   Cardiovascular:  Negative for leg swelling.  Gastrointestinal:  Positive for diarrhea, nausea and vomiting. Negative for abdominal pain and constipation.  Genitourinary:  Negative for dysuria and urgency.  Musculoskeletal:  Negative for gait problem.  Skin:  Negative for color change.  Neurological:  Negative for weakness and light-headedness.  Psychiatric/Behavioral:  Negative for confusion and sleep disturbance. The patient is not nervous/anxious.     Health Maintenance  Topic Date Due   Medicare Annual Wellness (AWV)  09/27/2023   MAMMOGRAM  11/26/2023   INFLUENZA VACCINE  01/24/2024   DTaP/Tdap/Td (3 - Td or Tdap) 12/19/2028   Pneumococcal Vaccine: 50+ Years  Completed   DEXA SCAN  Completed   Hepatitis B Vaccines  Aged Out   HPV VACCINES  Aged Out   Meningococcal B Vaccine  Aged Out   COVID-19 Vaccine  Discontinued   Zoster Vaccines- Shingrix  Discontinued    Physical Exam: Vitals:   12/23/23 1400  BP: (!) 140/80  Pulse: 97  Resp: 17  Temp: (!) 96.3 F (35.7 C)  SpO2: 95%  Weight: 173 lb (78.5 kg)  Height: 5' 5.5 (1.664 m)   Body mass index is 28.35 kg/m. Physical Exam Vitals and nursing note reviewed.  Constitutional:      Appearance: Normal appearance.  HENT:     Head: Normocephalic and atraumatic.      Nose: Nose normal.     Mouth/Throat:     Mouth: Mucous membranes are moist.   Eyes:     Extraocular Movements: Extraocular movements intact.     Conjunctiva/sclera: Conjunctivae normal.     Pupils: Pupils are equal, round, and reactive to light.    Cardiovascular:     Rate and Rhythm: Normal rate and regular rhythm.     Heart sounds: No murmur heard. Pulmonary:  Effort: Pulmonary effort is normal.     Breath sounds: No wheezing, rhonchi or rales.  Abdominal:     General: Bowel sounds are normal.     Palpations: Abdomen is soft.     Tenderness: There is no abdominal tenderness. There is no right CVA tenderness, left CVA tenderness, guarding or rebound.   Musculoskeletal:        General: Normal range of motion.     Cervical back: Normal range of motion and neck supple.     Right lower leg: No edema.     Left lower leg: No edema.   Skin:    General: Skin is warm and dry.   Neurological:     General: No focal deficit present.     Mental Status: She is alert and oriented to person, place, and time. Mental status is at baseline.     Gait: Gait normal.   Psychiatric:        Mood and Affect: Mood normal.        Behavior: Behavior normal.        Thought Content: Thought content normal.        Judgment: Judgment normal.     Labs reviewed: Basic Metabolic Panel: Recent Labs    02/06/23 0934 08/19/23 0916 12/23/23 1508  NA 134* 137 128*  K 4.8 4.8 4.5  CL 100 103 94*  CO2 26 27 24   GLUCOSE 112* 105* 147*  BUN 17 20 9   CREATININE 0.94 1.06* 0.76  CALCIUM  9.5 9.5 9.0  TSH 1.15  --   --    Liver Function Tests: Recent Labs    02/06/23 0934 08/19/23 0916 12/23/23 1508  AST 16 15 16   ALT 12 12 14   BILITOT 0.5 0.6 0.7  PROT 7.1 6.7 7.6   No results for input(s): LIPASE, AMYLASE in the last 8760 hours. No results for input(s): AMMONIA in the last 8760 hours. CBC: Recent Labs    02/06/23 0934  WBC 5.4  NEUTROABS 3,434  HGB 15.0  HCT 46.8*   MCV 89.3  PLT 236   Lipid Panel: Recent Labs    02/06/23 0934  CHOL 155  HDL 62  LDLCALC 74  TRIG 107  CHOLHDL 2.5   Lab Results  Component Value Date   HGBA1C 5.6 08/15/2020    Procedures since last visit: DG HIP UNILAT WITH PELVIS 2-3 VIEWS RIGHT Result Date: 03/25/2023 CLINICAL DATA:  Chronic right hip pain without recent falls. EXAM: DG HIP (WITH OR WITHOUT PELVIS) 2-3V RIGHT COMPARISON:  None Available. FINDINGS: There is no evidence of hip fracture or dislocation. Moderate osteophyte formation of right hip is noted. IMPRESSION: Moderate degenerative joint disease of right hip. No acute abnormality seen. Electronically Signed   By: Lynwood Landy Raddle M.D.   On: 03/25/2023 16:51    Assessment/Plan Gastritis Less small loose stools over the past 2 days,  last this am, vomited 2 days ago after sandwich, abd discomfort, no pain, afebrile CBC/diff, CMP/eGFR Zofran  prn  HTN (hypertension) Blood pressure is controlled,  on Metoprolol , Bun/creat 20/1.06 08/19/23  Anxiety state on Lorazepam   GERD (gastroesophageal reflux disease) stable on Omeprazole , Hgb 15 02/06/23  Hypothyroidism on Levothyroxine . TSH 1.15 02/06/24  Hyponatremia Sodium 128 12/23/2023, will add salt tablet 1 g 2 times a day, increase oral fluid hydration, repeat BMP 12/26/2023.    Labs/tests ordered: CBC/diff, CMP/eGFR Next appt:  2 weeks.

## 2023-12-23 NOTE — Assessment & Plan Note (Signed)
>>  ASSESSMENT AND PLAN FOR ANXIETY WRITTEN ON 12/23/2023  4:31 PM BY MAST, MAN X, NP  on Lorazepam

## 2023-12-23 NOTE — Assessment & Plan Note (Addendum)
 Less small loose stools over the past 2 days,  last this am, vomited 2 days ago after sandwich, abd discomfort, no pain, afebrile CBC/diff, CMP/eGFR Zofran  prn

## 2023-12-23 NOTE — Assessment & Plan Note (Signed)
 on Levothyroxine . TSH 1.15 02/06/24

## 2023-12-24 ENCOUNTER — Ambulatory Visit: Payer: Self-pay | Admitting: *Deleted

## 2023-12-24 ENCOUNTER — Ambulatory Visit: Payer: Self-pay | Admitting: Nurse Practitioner

## 2023-12-24 LAB — COMPREHENSIVE METABOLIC PANEL WITH GFR
AG Ratio: 1.3 (calc) (ref 1.0–2.5)
ALT: 14 U/L (ref 6–29)
AST: 16 U/L (ref 10–35)
Albumin: 4.3 g/dL (ref 3.6–5.1)
Alkaline phosphatase (APISO): 71 U/L (ref 37–153)
BUN: 9 mg/dL (ref 7–25)
CO2: 24 mmol/L (ref 20–32)
Calcium: 9 mg/dL (ref 8.6–10.4)
Chloride: 94 mmol/L — ABNORMAL LOW (ref 98–110)
Creat: 0.76 mg/dL (ref 0.60–0.95)
Globulin: 3.3 g/dL (ref 1.9–3.7)
Glucose, Bld: 147 mg/dL — ABNORMAL HIGH (ref 65–99)
Potassium: 4.5 mmol/L (ref 3.5–5.3)
Sodium: 128 mmol/L — ABNORMAL LOW (ref 135–146)
Total Bilirubin: 0.7 mg/dL (ref 0.2–1.2)
Total Protein: 7.6 g/dL (ref 6.1–8.1)
eGFR: 75 mL/min/{1.73_m2} (ref >=60)

## 2023-12-24 LAB — POCT INFLUENZA A/B
Influenza A, POC: NEGATIVE
Influenza B, POC: NEGATIVE

## 2023-12-24 LAB — POC COVID19 BINAXNOW: SARS Coronavirus 2 Ag: NEGATIVE

## 2023-12-24 MED ORDER — SODIUM CHLORIDE 1 G PO TABS
1.0000 g | ORAL_TABLET | Freq: Two times a day (BID) | ORAL | 1 refills | Status: DC
Start: 2023-12-24 — End: 2024-01-22

## 2023-12-24 NOTE — Telephone Encounter (Signed)
 Patient seen Julie Park yesterday. I will send message to her for a directive to give patient/daughter

## 2023-12-24 NOTE — Telephone Encounter (Signed)
 Mast, Man X, NP to Me  (Selected Message)     12/24/23 12:28 PM Please inform the patient her sodium 128 12/23/2023, will add salt tablet 1 g 2 times a day-prescription sent, increase oral fluid hydration, repeat BMP 12/26/2023. ED eval and treat if no better   Thank you   Left message on voicemail for patient/daughter to return call when available, reason for call: share the above response from Provider. I also sent a mychart message with providers response

## 2023-12-24 NOTE — Assessment & Plan Note (Signed)
 Sodium 128 12/23/2023, will add salt tablet 1 g 2 times a day, increase oral fluid hydration, repeat BMP 12/26/2023.

## 2023-12-24 NOTE — Telephone Encounter (Signed)
 FYI Only or Action Required?: Action required by provider: clinical question for provider and lab or test result follow-up needed.  Patient was last seen in primary care on 12/23/2023 by Mast, Man X, NP. Called Nurse Triage reporting Cough. Symptoms began today. Interventions attempted: Rest, hydration, or home remedies. Symptoms are: stable.  Triage Disposition: See Physician Within 24 Hours  Patient/caregiver understands and will follow disposition?: No, wishes to speak with PCP                Copied from CRM [id number]. Topic: Clinical - Medical Advice >> Dec 24, 2023 10:26 AM Diannia H wrote: Reason for CRM: Patient was seen yesterday and now she has developed a cough, running nose, sneezing, headache, and a fever, the fever is up to 100. Jerel her daughter is wanting to know is this all apart of her diagnosis or does she need to be seen. She will do a Covid test later to see what that says, could you please assist? callback number is [phone]. Reason for Disposition  [1] Continuous (nonstop) coughing interferes with work or school AND [2] no improvement using cough treatment per Care Advice    Patient does not have non stop coughing  Answer Assessment - Initial Assessment Questions 1. ONSET: When did the cough begin?      Yesterday after OV per patient daughter not with patient at this time. 2. SEVERITY: How bad is the cough today?      Na  3. SPUTUM: Describe the color of your sputum (none, dry cough; clear, white, yellow, green)     Not sure  4. HEMOPTYSIS: Are you coughing up any blood? If so ask: How much? (flecks, streaks, tablespoons, etc.)     Na  5. DIFFICULTY BREATHING: Are you having difficulty breathing? If Yes, ask: How bad is it? (e.g., mild, moderate, severe)    - MILD: No SOB at rest, mild SOB with walking, speaks normally in sentences, can lie down, no retractions, pulse < 100.    - MODERATE: SOB at rest, SOB with minimal exertion and prefers  to sit, cannot lie down flat, speaks in phrases, mild retractions, audible wheezing, pulse 100-120.    - SEVERE: Very SOB at rest, speaks in single words, struggling to breathe, sitting hunched forward, retractions, pulse > 120      Denies  6. FEVER: Do you have a fever? If Yes, ask: What is your temperature, how was it measured, and when did it start?     99.6 one hour ago and daughter reports this temp is high for patient 7. CARDIAC HISTORY: Do you have any history of heart disease? (e.g., heart attack, congestive heart failure)      See hx  8. LUNG HISTORY: Do you have any history of lung disease?  (e.g., pulmonary embolus, asthma, emphysema)     See hx  9. PE RISK FACTORS: Do you have a history of blood clots? (or: recent major surgery, recent prolonged travel, bedridden)     Na  10. OTHER SYMPTOMS: Do you have any other symptoms? (e.g., runny nose, wheezing, chest pain)       Coughing, runny nose, sneezing, headache, fever 99.6. 11. PREGNANCY: Is there any chance you are pregnant? When was your last menstrual period?       Na  12. TRAVEL: Have you traveled out of the country in the last month? (e.g., travel history, exposures)       Na   Patient daughter requesting if results  from lab work back. In review of chart, covid / flu testing ordered but has not been collected. Patient unable to produce specimen for c diff collection. No collection or order noted for BMP/ CBC when patient daughter requesting results. Please advise if patient needs additional OV. Daughter will call back if patient c/o chest pain or difficulty breathing. Recommended to keep patient hydrated with water, Gatorade or Pedialyte . Please advise  Protocols used: Cough - Acute Non-Productive-A-AH

## 2023-12-25 ENCOUNTER — Ambulatory Visit (HOSPITAL_BASED_OUTPATIENT_CLINIC_OR_DEPARTMENT_OTHER): Admitting: Physical Therapy

## 2023-12-25 ENCOUNTER — Ambulatory Visit: Payer: Self-pay

## 2023-12-25 NOTE — Telephone Encounter (Signed)
  FYI Only or Action Required?: FYI only for provider.  Patient was last seen in primary care on 12/23/2023 by Mast, Man X, NP. Called Nurse Triage reporting medication question. Symptoms began today. Interventions attempted: Prescription medications: zofran  and Rest, hydration, or home remedies. Symptoms are: completely resolved.  Triage Disposition: Information or Advice Only Call  Patient/caregiver understands and will follow disposition?:   Copied from CRM [id number]. Topic: Clinical - Red Word Triage >> Dec 25, 2023  4:00 PM Brittney F wrote: [facility] that prompted transfer to Nurse Triage:   Patient has not been able to keep food down for two days outside melon and crackers; Everything else turns her stomach; The patient has been making sure to stay hydrated by drinking water and Gatorade; Patient has not been taking her medication as prescribed due to not feeling well; Patient is in need of advice on which medications she can take with minimal food on her stomach.  Rapid heartbeat Exhaustion Temperature break around 4am   Callback Number: [phone] Reason for Disposition  Caller has medicine question only, adult not sick, AND triager answers question  Answer Assessment - Initial Assessment Questions Patient would like to know if it is safe to take her medications as she was recently treated for nausea and vomiting.  4. SYMPTOMS: Do you have any symptoms? If Yes, ask: What symptoms are you having?  How bad are the symptoms (e.g., mild, moderate, severe)     Nausea, vomiting, and diarrhea have resolved, patient also has zofran  PRN  Reviewed patient's medication list with her from her last office visit on 12/23/2023. She will continue to advance her diet and resume medications  Protocols used: Medication Question Call-A-AH

## 2023-12-26 ENCOUNTER — Other Ambulatory Visit: Payer: Self-pay | Admitting: Orthopedic Surgery

## 2023-12-26 DIAGNOSIS — G8929 Other chronic pain: Secondary | ICD-10-CM

## 2023-12-26 DIAGNOSIS — F419 Anxiety disorder, unspecified: Secondary | ICD-10-CM

## 2023-12-26 NOTE — Telephone Encounter (Signed)
 Patient is requesting a refill of the following medications: Requested Prescriptions   Pending Prescriptions Disp Refills   LORazepam  (ATIVAN ) 1 MG tablet [Pharmacy Med Name: LORAZEPAM  1 MG TABLET] 90 tablet 0    Sig: TAKE 1 TABLET (1 MG TOTAL) BY MOUTH EVERY 8 (EIGHT) HOURS AS NEEDED. FOR ANXIETY   traMADol  (ULTRAM ) 50 MG tablet [Pharmacy Med Name: TRAMADOL  HCL 50 MG TABLET] 15 tablet 0    Sig: TAKE 1/2 TABLET BY MOUTH DAILY    Date of last refill: 11/25/23  Refill amount: 0  Treatment agreement date: 08/21/23

## 2023-12-31 ENCOUNTER — Encounter (HOSPITAL_BASED_OUTPATIENT_CLINIC_OR_DEPARTMENT_OTHER): Payer: Self-pay | Admitting: Physical Therapy

## 2023-12-31 ENCOUNTER — Ambulatory Visit (HOSPITAL_BASED_OUTPATIENT_CLINIC_OR_DEPARTMENT_OTHER): Attending: Orthopedic Surgery | Admitting: Physical Therapy

## 2023-12-31 DIAGNOSIS — M5459 Other low back pain: Secondary | ICD-10-CM | POA: Diagnosis present

## 2023-12-31 DIAGNOSIS — R2681 Unsteadiness on feet: Secondary | ICD-10-CM | POA: Insufficient documentation

## 2023-12-31 DIAGNOSIS — M25551 Pain in right hip: Secondary | ICD-10-CM | POA: Diagnosis present

## 2023-12-31 DIAGNOSIS — R262 Difficulty in walking, not elsewhere classified: Secondary | ICD-10-CM | POA: Diagnosis present

## 2023-12-31 DIAGNOSIS — M6281 Muscle weakness (generalized): Secondary | ICD-10-CM | POA: Insufficient documentation

## 2023-12-31 NOTE — Telephone Encounter (Signed)
 Spoke with patient, patient states she picked up sodium tablets, however she has not started due to nausea and the sodium tablet would make the nausea worse (according to the potential side effect on the package insert). Patient plans to start sodium tablet today. Patient states she will call back to schedule lab appointment. Order was already placed by [facility]

## 2023-12-31 NOTE — Therapy (Signed)
 OUTPATIENT PHYSICAL THERAPY THORACOLUMBAR TREATMENT   Patient Name: Julie Park MRN: [id number] DOB:1934-01-31, 89 y.o., female Today's Date: 12/31/2023  END OF SESSION:  PT End of Session - 12/31/23 1145     Visit Number 5    Number of Visits 16    Date for PT Re-Evaluation 02/07/24    Authorization Type [employer]ul[employer]r    PT Start Time 1145    PT Stop Time 1225    PT Time Calculation (min) 40 min    Activity Tolerance Patient tolerated treatment well    Behavior During Therapy [facility] for tasks assessed/performed          Past Medical History:  Diagnosis Date   Anxiety    Chest pain 02/08/15   ETT normal   Diverticulosis    Diverticulitis (1982)   Fracture of rib of right side 08/16/2013   GERD (gastroesophageal reflux disease)    Hyperlipidemia, mixed    Hypothyroidism    IBS (irritable bowel syndrome)    Morton's neuroma    Right lumbar radiculopathy    Intermittent x 2 episodes in summer 2014   Shingles 1967 and 2010   Ulcer    Urine incontinence    Vertigo    Past Surgical History:  Procedure Laterality Date   CARDIOVASCULAR STRESS TEST  02/08/15   ETT normal   CATARACT EXTRACTION  2012   Dr. Cleatus   COLONOSCOPY  2005   Dr. Myriam at [facility] (normal per pt report)   DEXA  11/03/13   Heel T score -0.8.   FOOT NEUROMA SURGERY  1994   right foot   TONSILLECTOMY     TONSILLECTOMY AND ADENOIDECTOMY  1948   Dr. Neomia Benne [facility]   TUBAL LIGATION  [phone]   WISDOM TOOTH EXTRACTION     Patient Active Problem List   Diagnosis Date Noted   Gastritis 12/23/2023   HTN (hypertension) 12/23/2023   Right hip pain 03/12/2023   Cardiac chest pain 08/20/2022   Lower extremity pain 08/20/2022   Gait abnormality 04/25/2021   Nevus of choroid of right eye 08/24/2019   Posterior vitreous detachment of right eye 08/24/2019   Pseudophakia of both eyes 08/24/2019   GAD (generalized anxiety disorder) 07/11/2018   Overactive bladder 07/11/2018   Functional urinary  incontinence 07/10/2018   Hyponatremia 03/31/2014   HLD (hyperlipidemia)    Diverticular disease    Anxiety state    GERD (gastroesophageal reflux disease)    Hypothyroidism    Urine incontinence    IRRITABLE BOWEL SYNDROME 03/02/2010    PCP: Gil Greig BRAVO, NP   REFERRING PROVIDER: Gil Greig BRAVO, NP   REFERRING DIAG: M54.41,M54.42,G89.29 (ICD-10-CM) - Chronic midline low back pain with bilateral sciatica   Rationale for Evaluation and Treatment: Rehabilitation  THERAPY DIAG:  Other low back pain  Unsteadiness on feet  Pain in right hip  ONSET DATE: 2020  SUBJECTIVE:  SUBJECTIVE STATEMENT: Pt had a norovirus which lasted for  about a week.  Feeling weak today.  POOL ACCESS: neighbor has pool.  She may consider joining [employer]  Initial Subjective Picked up a case of waters in 2020 and had compression fx  (T10 or 12) which has healed but pain has been consistent since then.  I also have oa right hip which hurts.  I am afraid of falling.  I don't move around much in my home.  Use a cane when I lave the house.  As long as I can have 2 fingers on the wall or sofa I can get around my house pretty good.  PERTINENT HISTORY:  Thoracic compression fx 2020 Oa hip right  PAIN:  Are you having pain? no: NPRS scale: 0/10 LB Pain location: mid to lower back Pain description: a Aggravating factors: walking 10 minutes Relieving factors: resting lying down  PRECAUTIONS: Fall  RED FLAGS: None   WEIGHT BEARING RESTRICTIONS: No  FALLS:  Has patient fallen in last 6 months? No  LIVING ENVIRONMENT: Lives with: lives with their family Lives in: House/apartment Stairs: Yes: Internal: 12-14 steps; on right going up and on left going up and External: 0 steps; none Has following equipment at  home: Quad cane large base, Environmental consultant - 4 wheeled, Marine scientist  OCCUPATION: retired  PLOF: Insurance account manager touch with walking through home    PATIENT GOALS: be able to walk for a period of time with little to  no pain in my back. Dtr wants mother to improve gait.  NEXT MD VISIT: as needed  OBJECTIVE:  Note: Objective measures were completed at Evaluation unless otherwise noted.  DIAGNOSTIC FINDINGS:  03/11/24 IMPRESSION: Moderate degenerative joint disease of right hip. No acute abnormality seen.  PATIENT SURVEYS:  Modified Oswestry 23/50=46%   COGNITION: Overall cognitive status: Within functional limits for tasks assessed     SENSATION: WFL  MUSCLE LENGTH: Hamstrings: mild restriction on B Gastrocnemius: moderate restriction on B   POSTURE: rounded shoulders, forward head, and flexed trunk (standing).  Left pelvic obliquity   LUMBAR ROM:   AROM eval  Flexion full  Extension 50% limited P!  Right lateral flexion P!  Left lateral flexion 50% limited P!  Right rotation   Left rotation    (Blank rows = not tested)    LOWER EXTREMITY MMT:    MMT Right eval Left eval  Hip flexion 3+ 3+  Hip extension    Hip abduction 4+ 4+  Hip adduction 5 5  Hip internal rotation    Hip external rotation    Knee flexion 3+ 3+  Knee extension 3+ 3+  Ankle dorsiflexion    Ankle plantarflexion    Ankle inversion    Ankle eversion     (Blank rows = not tested)    FUNCTIONAL TESTS:  30 seconds chair stand test: 3 from pool bench with ue support Timed up and go (TUG): >30s BERG Balance Test          Date:   Sit to Stand 3  Standing unsupported 4  Sitting with back unsupported but feet supported 4  Stand to sit  2  Transfers  3  Standing unsupported with eyes closed 3  Standing unsupported feet together 3  From standing position, reach forward with outstretched arm 3  From standing position, pick up object from floor 3  From standing position, turn and  look behind over each shoulder 1  Turn 360  2  Standing unsupported, alternately place foot on step 0  Standing unsupported, one foot in front 0  Standing on one leg 0  Total:  31/56     GAIT: Distance walked: 400 Assistive device utilized: Personal assistant bench Level of assistance: Modified independence Comments: guarded, reduced step length and cadence, decreased heel strike and toe off  TREATMENT  [facility] Adult PT Treatment:                                                DATE: 12/30/23 Pt seen for aquatic therapy today.  Treatment took place in water 3.5-4.75 ft in depth at the [facility] pool. Temp of water was 91.  Pt entered/exited the pool via stairs using step to and alternating pattern with hand rail.  *UE on barbell: walking forward, backward and side stepping, multiple widths, cues for vertical trunk and relaxed arms *seated on lift rest->cycling, hip add/abd; LAQ *seated rest period *standing ue support on wall 3.6 ft: df; pf; high knee marching; hip add/abd; relaxed squats *seated rest period->cycling *stair climbing in and out using a combination of step to and alternating pattern and hand rails indep  Pt requires the buoyancy and hydrostatic pressure of water for support, and to offload joints by unweighting joint load by at least 50 % in navel deep water and by at least 75-80% in chest to neck deep water.  Viscosity of the water is needed for resistance of strengthening. Water current perturbations provides challenge to standing balance requiring increased core activation.                                                                        PATIENT EDUCATION:  Education details: intro to aquatic therapy  Person educated: Patient Education method: Explanation  Education comprehension: verbalized understanding  HOME EXERCISE PROGRAM: Walking daily   ASSESSMENT:  CLINICAL IMPRESSION: Pt returning post Norovirus with reports of still  feeling weak, has lost 8 lbs. Dtr reports she was able to get pool acces over weekend and pt was able to get in and exercise some but she has not yet been able to engage in any regular exercise program. She is provided multiple rest periods throughout.  VC encouraging hydration. Pt does report a reduction in LBP as well as meds to control since being sick as she rested in bed a lot.  She did have a reduction of overall pain prior to illness as well since the initiation of PT. Session focused on re-acquainting her with exercises and building back toleration.       Initial Impression Patient is a 89 y.o. f who was seen today for physical therapy evaluation and treatment for LBP. She sustained a compression fx 5 years ago which continues to cause pain. As per pt, she is afraid of falling and because of that has been very sedentary. Has not had a fall in past year.  Main goals are to improve gait, toleration to amb and improve strength for overall improved function/mobility and health.  Exam indicates decreased strength throughout LE and core,  balance and proprioceptive deficits with increased risk of falls, Left pelvic obliquity and gait deviation.  She will benefit from skilled physical therapy to improve all areas of deficits as well as QOL.  OBJECTIVE IMPAIRMENTS: Abnormal gait, decreased activity tolerance, decreased balance, decreased coordination, decreased knowledge of use of DME, decreased mobility, difficulty walking, decreased strength, postural dysfunction, and pain.   ACTIVITY LIMITATIONS: carrying, lifting, bending, standing, squatting, stairs, transfers, and locomotion level  PARTICIPATION LIMITATIONS: meal prep, cleaning, shopping, and community activity  PERSONAL FACTORS: Age, Fitness, Past/current experiences, and Time since onset of injury/illness/exacerbation are also affecting patient's functional outcome.   REHAB POTENTIAL: Good  CLINICAL DECISION MAKING: Evolving/moderate  complexity  EVALUATION COMPLEXITY: Moderate   GOALS: Goals reviewed with patient? Yes  SHORT TERM GOALS: Target date: 12/08/23  Pt will tolerate full aquatic sessions consistently without increase in pain and with improving function to demonstrate good toleration and effectiveness of intervention.  Baseline: Goal status:MET -12/10/23  2.  Pt will tolerate stair climbing using combination of an alternating and step to pattern ascending and descending 6 steps without use of handrail Baseline:  Goal status: Met 12/31/23  3.  Pt will report reduction of pain submerged to 0/10 Baseline:  Goal status: Met 12/31/23  4.  Pt will begin amb routinely 2-3 x weekly with daughter for exercise as HEP. Baseline:  Goal status: In progress 12/31/23   LONG TERM GOALS: Target date: 02/07/24  Pt to improve on ODI by 13-15 (MCID)  % to demonstrate statistically significant Improvement in function. Baseline:Modified Oswestry 23/50=46%  Goal status: INITIAL   2. Pt will amb x1000 ft without limitation to pain using ad Baseline:  Goal status: INITIAL   3.  Pt will improve on Berg balance test to >/= 40/56 to demonstrate a decrease in fall risk. Baseline: 31/56 Goal status: INITIAL  4.  Pt will improve strength in all areas listed by 1 grade to demonstrate improved overall physical function Baseline:  Goal status: INITIAL  5.  Pt will demonstrate improved gait pattern with more normal step length with heel strike and toe off each step Baseline:  Goal status: INITIAL  6.  Pt will improve on Tug test to <or= 21s (avg for older adults) to demonstrate improvement in lower extremity function, mobility and decreased fall risk. Baseline:  Goal status: INITIAL  PLAN:  PT FREQUENCY: 2x/week  PT DURATION: 8 weeks  PLANNED INTERVENTIONS: 97164- PT Re-evaluation, 97110-Therapeutic exercises, 97530- Therapeutic activity, 97112- Neuromuscular re-education, 97535- Self Care, 02859- Manual therapy, [phone]-  Gait training, [phone]- Aquatic Therapy, [phone]- Electrical stimulation (unattended), [phone]- Ionotophoresis 4mg /ml Dexamethasone, Patient/Family education, Balance training, Stair training, Taping, Dry Needling, Joint mobilization, DME instructions, Cryotherapy, and Moist heat.  PLAN FOR NEXT SESSION: general core and LE strengthening, balance retraining, gait, AD instruction, pain managemen.  Begin aquatics only, transition land after a few weeks  Ronal Kem) Lexi Conaty MPT 12/31/23 12:35 PM [facility] [address] [place], [place], [address] Phone: [phone]   Fax:  [phone]

## 2024-01-02 ENCOUNTER — Encounter (HOSPITAL_BASED_OUTPATIENT_CLINIC_OR_DEPARTMENT_OTHER): Payer: Self-pay | Admitting: Physical Therapy

## 2024-01-02 ENCOUNTER — Ambulatory Visit (HOSPITAL_BASED_OUTPATIENT_CLINIC_OR_DEPARTMENT_OTHER): Admitting: Physical Therapy

## 2024-01-02 DIAGNOSIS — R2681 Unsteadiness on feet: Secondary | ICD-10-CM

## 2024-01-02 DIAGNOSIS — M25551 Pain in right hip: Secondary | ICD-10-CM

## 2024-01-02 DIAGNOSIS — M5459 Other low back pain: Secondary | ICD-10-CM

## 2024-01-02 DIAGNOSIS — M6281 Muscle weakness (generalized): Secondary | ICD-10-CM

## 2024-01-02 NOTE — Therapy (Signed)
 OUTPATIENT PHYSICAL THERAPY THORACOLUMBAR TREATMENT   Patient Name: Julie Park MRN: [id number] DOB:October 13, 1933, 89 y.o., female Today's Date: 01/02/2024  END OF SESSION:  PT End of Session - 01/02/24 1159     Visit Number 6    Number of Visits 16    Date for PT Re-Evaluation 02/07/24    Authorization Type [employer] Mcr    PT Start Time 1150    PT Stop Time 1230    PT Time Calculation (min) 40 min    Activity Tolerance Patient tolerated treatment well    Behavior During Therapy [facility] for tasks assessed/performed          Past Medical History:  Diagnosis Date   Anxiety    Chest pain 02/08/15   ETT normal   Diverticulosis    Diverticulitis (1982)   Fracture of rib of right side 08/16/2013   GERD (gastroesophageal reflux disease)    Hyperlipidemia, mixed    Hypothyroidism    IBS (irritable bowel syndrome)    Morton's neuroma    Right lumbar radiculopathy    Intermittent x 2 episodes in summer 2014   Shingles 1967 and 2010   Ulcer    Urine incontinence    Vertigo    Past Surgical History:  Procedure Laterality Date   CARDIOVASCULAR STRESS TEST  02/08/15   ETT normal   CATARACT EXTRACTION  2012   Dr. Cleatus   COLONOSCOPY  2005   Dr[facility] Myriam at [facility] (normal per pt report)   DEXA  11/03/13   Heel T score -0.8.   FOOT NEUROMA SURGERY  1994   right foot   TONSILLECTOMY     TONSILLECTOMY AND ADENOIDECTOMY  1948   Dr. Neomia Benne [facility]   TUBAL LIGATION  [phone]   WISDOM TOOTH EXTRACTION     Patient Active Problem List   Diagnosis Date Noted   Gastritis 12/23/2023   HTN (hypertension) 12/23/2023   Right hip pain 03/12/2023   Cardiac chest pain 08/20/2022   Lower extremity pain 08/20/2022   Gait abnormality 04/25/2021   Nevus of choroid of right eye 08/24/2019   Posterior vitreous detachment of right eye 08/24/2019   Pseudophakia of both eyes 08/24/2019   GAD (generalized anxiety disorder) 07/11/2018   Overactive bladder 07/11/2018   Functional urinary  incontinence 07/10/2018   Hyponatremia 03/31/2014   HLD (hyperlipidemia)    Diverticular disease    Anxiety state    GERD (gastroesophageal reflux disease)    Hypothyroidism    Urine incontinence    IRRITABLE BOWEL SYNDROME 03/02/2010    PCP: Gil Greig BRAVO, NP   REFERRING PROVIDER: Gil Greig BRAVO, NP   REFERRING DIAG: M54.41,M54.42,G89.29 (ICD-10-CM) - Chronic midline low back pain with bilateral sciatica   Rationale for Evaluation and Treatment: Rehabilitation  THERAPY DIAG:  Other low back pain  Unsteadiness on feet  Pain in right hip  Muscle weakness (generalized)  ONSET DATE: 2020  SUBJECTIVE:  SUBJECTIVE STATEMENT: Pt had a norovirus which lasted for  about a week.  Feeling weak today.  POOL ACCESS: neighbor has pool.  She may consider joining [employer]  Initial Subjective Picked up a case of waters in 2020 and had compression fx  (T10 or 12) which has healed but pain has been consistent since then.  I also have oa right hip which hurts.  I am afraid of falling.  I don't move around much in my home.  Use a cane when I lave the house.  As long as I can have 2 fingers on the wall or sofa I can get around my house pretty good.  PERTINENT HISTORY:  Thoracic compression fx 2020 Oa hip right  PAIN:  Are you having pain? no: NPRS scale: 0/10 LB Pain location: mid to lower back Pain description: a Aggravating factors: walking 10 minutes Relieving factors: resting lying down  PRECAUTIONS: Fall  RED FLAGS: None   WEIGHT BEARING RESTRICTIONS: No  FALLS:  Has patient fallen in last 6 months? No  LIVING ENVIRONMENT: Lives with: lives with their family Lives in: House/apartment Stairs: Yes: Internal: 12-14 steps; on right going up and on left going up and External: 0 steps;  none Has following equipment at home: Quad cane large base, Environmental consultant - 4 wheeled, Marine scientist  OCCUPATION: retired  PLOF: Insurance account manager touch with walking through home    PATIENT GOALS: be able to walk for a period of time with little to  no pain in my back. Dtr wants mother to improve gait.  NEXT MD VISIT: as needed  OBJECTIVE:  Note: Objective measures were completed at Evaluation unless otherwise noted.  DIAGNOSTIC FINDINGS:  03/11/24 IMPRESSION: Moderate degenerative joint disease of right hip. No acute abnormality seen.  PATIENT SURVEYS:  Modified Oswestry 23/50=46%   COGNITION: Overall cognitive status: Within functional limits for tasks assessed     SENSATION: WFL  MUSCLE LENGTH: Hamstrings: mild restriction on B Gastrocnemius: moderate restriction on B   POSTURE: rounded shoulders, forward head, and flexed trunk (standing).  Left pelvic obliquity   LUMBAR ROM:   AROM eval  Flexion full  Extension 50% limited P!  Right lateral flexion P!  Left lateral flexion 50% limited P!  Right rotation   Left rotation    (Blank rows = not tested)    LOWER EXTREMITY MMT:    MMT Right eval Left eval  Hip flexion 3+ 3+  Hip extension    Hip abduction 4+ 4+  Hip adduction 5 5  Hip internal rotation    Hip external rotation    Knee flexion 3+ 3+  Knee extension 3+ 3+  Ankle dorsiflexion    Ankle plantarflexion    Ankle inversion    Ankle eversion     (Blank rows = not tested)    FUNCTIONAL TESTS:  30 seconds chair stand test: 3 from pool bench with ue support Timed up and go (TUG): >30s BERG Balance Test          Date:   Sit to Stand 3  Standing unsupported 4  Sitting with back unsupported but feet supported 4  Stand to sit  2  Transfers  3  Standing unsupported with eyes closed 3  Standing unsupported feet together 3  From standing position, reach forward with outstretched arm 3  From standing position, pick up object from floor 3   From standing position, turn and look behind over each shoulder 1  Turn 360  2  Standing unsupported, alternately place foot on step 0  Standing unsupported, one foot in front 0  Standing on one leg 0  Total:  31/56     GAIT: Distance walked: 400 Assistive device utilized: Personal assistant bench Level of assistance: Modified independence Comments: guarded, reduced step length and cadence, decreased heel strike and toe off  TREATMENT  [facility] Adult PT Treatment:                                                DATE: 01/02/24 Pt seen for aquatic therapy today.  Treatment took place in water 3.5-4.75 ft in depth at the [facility] pool. Temp of water was 91.  Pt entered/exited the pool via stairs using step to and alternating pattern with hand rail.  *UE on barbell: walking forward, backward and side stepping, multiple widths, cues for vertical trunk and relaxed arms *seated on lift rest->cycling, hip add/abd; LAQ *standing ue support on wall 3.6 ft: df; pf; high knee marching; hip add/abd; relaxed squats *squatted rest period *1/2 noodle press for TrA engagement in wide stance then staggered x 5 ea *staggered stances ue horizontal add/abd using RBHB *return to walking *step tapping ue support on hand rails *stair climbing in using a combination of step to and alternating pattern and hand rails indep; out using alternating pattern.  Pt requires the buoyancy and hydrostatic pressure of water for support, and to offload joints by unweighting joint load by at least 50 % in navel deep water and by at least 75-80% in chest to neck deep water.  Viscosity of the water is needed for resistance of strengthening. Water current perturbations provides challenge to standing balance requiring increased core activation.                                                                        PATIENT EDUCATION:  Education details: intro to aquatic therapy  Person educated:  Patient Education method: Explanation  Education comprehension: verbalized understanding  HOME EXERCISE PROGRAM: Walking daily   ASSESSMENT:  CLINICAL IMPRESSION: Pt feels better stating her energy is slowly returning. Just some tiredness after last session.  Slowly progressing her back to prior level by adding to exercises today with reduced rest periods. She is encouraged to hydrate as she is exercising today drinking water she has brought with her. was able to progress balance challenges with very good toleration.  Goals ongoing.   Initial Impression Patient is a 89 y.o. f who was seen today for physical therapy evaluation and treatment for LBP. She sustained a compression fx 5 years ago which continues to cause pain. As per pt, she is afraid of falling and because of that has been very sedentary. Has not had a fall in past year.  Main goals are to improve gait, toleration to amb and improve strength for overall improved function/mobility and health.  Exam indicates decreased strength throughout LE and core, balance and proprioceptive deficits with increased risk of falls, Left pelvic obliquity and gait deviation.  She will benefit from skilled physical therapy to improve  all areas of deficits as well as QOL.  OBJECTIVE IMPAIRMENTS: Abnormal gait, decreased activity tolerance, decreased balance, decreased coordination, decreased knowledge of use of DME, decreased mobility, difficulty walking, decreased strength, postural dysfunction, and pain.   ACTIVITY LIMITATIONS: carrying, lifting, bending, standing, squatting, stairs, transfers, and locomotion level  PARTICIPATION LIMITATIONS: meal prep, cleaning, shopping, and community activity  PERSONAL FACTORS: Age, Fitness, Past/current experiences, and Time since onset of injury/illness/exacerbation are also affecting patient's functional outcome.   REHAB POTENTIAL: Good  CLINICAL DECISION MAKING: Evolving/moderate complexity  EVALUATION  COMPLEXITY: Moderate   GOALS: Goals reviewed with patient? Yes  SHORT TERM GOALS: Target date: 12/08/23  Pt will tolerate full aquatic sessions consistently without increase in pain and with improving function to demonstrate good toleration and effectiveness of intervention.  Baseline: Goal status:MET -12/10/23  2.  Pt will tolerate stair climbing using combination of an alternating and step to pattern ascending and descending 6 steps without use of handrail Baseline:  Goal status: Met 12/31/23  3.  Pt will report reduction of pain submerged to 0/10 Baseline:  Goal status: Met 12/31/23  4.  Pt will begin amb routinely 2-3 x weekly with daughter for exercise as HEP. Baseline:  Goal status: In progress 12/31/23   LONG TERM GOALS: Target date: 02/07/24  Pt to improve on ODI by 13-15 (MCID)  % to demonstrate statistically significant Improvement in function. Baseline:Modified Oswestry 23/50=46%  Goal status: INITIAL   2. Pt will amb x1000 ft without limitation to pain using ad Baseline:  Goal status: INITIAL   3.  Pt will improve on Berg balance test to >/= 40/56 to demonstrate a decrease in fall risk. Baseline: 31/56 Goal status: INITIAL  4.  Pt will improve strength in all areas listed by 1 grade to demonstrate improved overall physical function Baseline:  Goal status: INITIAL  5.  Pt will demonstrate improved gait pattern with more normal step length with heel strike and toe off each step Baseline:  Goal status: INITIAL  6.  Pt will improve on Tug test to <or= 21s (avg for older adults) to demonstrate improvement in lower extremity function, mobility and decreased fall risk. Baseline:  Goal status: INITIAL  PLAN:  PT FREQUENCY: 2x/week  PT DURATION: 8 weeks  PLANNED INTERVENTIONS: 97164- PT Re-evaluation, 97110-Therapeutic exercises, 97530- Therapeutic activity, 97112- Neuromuscular re-education, 97535- Self Care, 02859- Manual therapy, [phone]- Gait training, [phone]-  Aquatic Therapy, [phone]- Electrical stimulation (unattended), [phone]- Ionotophoresis 4mg /ml Dexamethasone, Patient/Family education, Balance training, Stair training, Taping, Dry Needling, Joint mobilization, DME instructions, Cryotherapy, and Moist heat.  PLAN FOR NEXT SESSION: general core and LE strengthening, balance retraining, gait, AD instruction, pain managemen.  Begin aquatics only, transition land after a few weeks  Ronal Kem) Latisia Hilaire MPT 01/02/24 12:07 PM [facility] [address] [place], [place], [address] Phone: [phone]   Fax:  [phone]

## 2024-01-07 ENCOUNTER — Ambulatory Visit (HOSPITAL_BASED_OUTPATIENT_CLINIC_OR_DEPARTMENT_OTHER): Admitting: Physical Therapy

## 2024-01-09 ENCOUNTER — Ambulatory Visit (HOSPITAL_BASED_OUTPATIENT_CLINIC_OR_DEPARTMENT_OTHER): Admitting: Physical Therapy

## 2024-01-09 ENCOUNTER — Encounter (HOSPITAL_BASED_OUTPATIENT_CLINIC_OR_DEPARTMENT_OTHER): Payer: Self-pay | Admitting: Physical Therapy

## 2024-01-09 DIAGNOSIS — M5459 Other low back pain: Secondary | ICD-10-CM | POA: Diagnosis not present

## 2024-01-09 DIAGNOSIS — R2681 Unsteadiness on feet: Secondary | ICD-10-CM

## 2024-01-09 DIAGNOSIS — M25551 Pain in right hip: Secondary | ICD-10-CM

## 2024-01-09 NOTE — Therapy (Signed)
 OUTPATIENT PHYSICAL THERAPY THORACOLUMBAR TREATMENT   Patient Name: Julie Park MRN: [id number] DOB:1933-11-30, 89 y.o., female Today's Date: 01/09/2024  END OF SESSION:  PT End of Session - 01/09/24 1234     Visit Number 7    Number of Visits 16    Date for PT Re-Evaluation 02/07/24    Authorization Type hulan Mcr    PT Start Time 1147    PT Stop Time 1225    PT Time Calculation (min) 38 min    Activity Tolerance Patient tolerated treatment well    Behavior During Therapy [facility] for tasks assessed/performed           Past Medical History:  Diagnosis Date   Anxiety    Chest pain 02/08/15   ETT normal   Diverticulosis    Diverticulitis (1982)   Fracture of rib of right side 08/16/2013   GERD (gastroesophageal reflux disease)    Hyperlipidemia, mixed    Hypothyroidism    IBS (irritable bowel syndrome)    Morton's neuroma    Right lumbar radiculopathy    Intermittent x 2 episodes in summer 2014   Shingles 1967 and 2010   Ulcer    Urine incontinence    Vertigo    Past Surgical History:  Procedure Laterality Date   CARDIOVASCULAR STRESS TEST  02/08/15   ETT normal   CATARACT EXTRACTION  2012   Dr. Cleatus   COLONOSCOPY  2005   Dr. Myriam at [facility] (normal per pt report)   DEXA  11/03/13   Heel T score -0.8.   FOOT NEUROMA SURGERY  1994   right foot   TONSILLECTOMY     TONSILLECTOMY AND ADENOIDECTOMY  1948   Dr. Neomia Benne [facility]   TUBAL LIGATION  [phone]   WISDOM TOOTH EXTRACTION     Patient Active Problem List   Diagnosis Date Noted   Gastritis 12/23/2023   HTN (hypertension) 12/23/2023   Right hip pain 03/12/2023   Cardiac chest pain 08/20/2022   Lower extremity pain 08/20/2022   Gait abnormality 04/25/2021   Nevus of choroid of right eye 08/24/2019   Posterior vitreous detachment of right eye 08/24/2019   Pseudophakia of both eyes 08/24/2019   GAD (generalized anxiety disorder) 07/11/2018   Overactive bladder 07/11/2018   Functional urinary  incontinence 07/10/2018   Hyponatremia 03/31/2014   HLD (hyperlipidemia)    Diverticular disease    Anxiety state    GERD (gastroesophageal reflux disease)    Hypothyroidism    Urine incontinence    IRRITABLE BOWEL SYNDROME 03/02/2010    PCP: Gil Greig BRAVO, NP   REFERRING PROVIDER: Gil Greig BRAVO, NP   REFERRING DIAG: M54.41,M54.42,G89.29 (ICD-10-CM) - Chronic midline low back pain with bilateral sciatica   Rationale for Evaluation and Treatment: Rehabilitation  THERAPY DIAG:  Other low back pain  Unsteadiness on feet  Pain in right hip  ONSET DATE: 2020  SUBJECTIVE:  SUBJECTIVE STATEMENT: Pt had a norovirus which lasted for  about a week.  Feeling weak today.  POOL ACCESS: neighbor has pool.  She may consider joining [employer]  Initial Subjective Picked up a case of waters in 2020 and had compression fx  (T10 or 12) which has healed but pain has been consistent since then.  I also have oa right hip which hurts.  I am afraid of falling.  I don't move around much in my home.  Use a cane when I lave the house.  As long as I can have 2 fingers on the wall or sofa I can get around my house pretty good.  PERTINENT HISTORY:  Thoracic compression fx 2020 Oa hip right  PAIN:  Are you having pain? no: NPRS scale: 0/10 LB Pain location: mid to lower back Pain description: a Aggravating factors: walking 10 minutes Relieving factors: resting lying down  PRECAUTIONS: Fall  RED FLAGS: None   WEIGHT BEARING RESTRICTIONS: No  FALLS:  Has patient fallen in last 6 months? No  LIVING ENVIRONMENT: Lives with: lives with their family Lives in: House/apartment Stairs: Yes: Internal: 12-14 steps; on right going up and on left going up and External: 0 steps; none Has following equipment at  home: Quad cane large base, Environmental consultant - 4 wheeled, Marine scientist  OCCUPATION: retired  PLOF: Insurance account manager touch with walking through home    PATIENT GOALS: be able to walk for a period of time with little to  no pain in my back. Dtr wants mother to improve gait.  NEXT MD VISIT: as needed  OBJECTIVE:  Note: Objective measures were completed at Evaluation unless otherwise noted.  DIAGNOSTIC FINDINGS:  03/11/24 IMPRESSION: Moderate degenerative joint disease of right hip. No acute abnormality seen.  PATIENT SURVEYS:  Modified Oswestry 23/50=46%   COGNITION: Overall cognitive status: Within functional limits for tasks assessed     SENSATION: WFL  MUSCLE LENGTH: Hamstrings: mild restriction on B Gastrocnemius: moderate restriction on B   POSTURE: rounded shoulders, forward head, and flexed trunk (standing).  Left pelvic obliquity   LUMBAR ROM:   AROM eval  Flexion full  Extension 50% limited P!  Right lateral flexion P!  Left lateral flexion 50% limited P!  Right rotation   Left rotation    (Blank rows = not tested)    LOWER EXTREMITY MMT:    MMT Right eval Left eval  Hip flexion 3+ 3+  Hip extension    Hip abduction 4+ 4+  Hip adduction 5 5  Hip internal rotation    Hip external rotation    Knee flexion 3+ 3+  Knee extension 3+ 3+  Ankle dorsiflexion    Ankle plantarflexion    Ankle inversion    Ankle eversion     (Blank rows = not tested)    FUNCTIONAL TESTS:  30 seconds chair stand test: 3 from pool bench with ue support Timed up and go (TUG): >30s BERG Balance Test          Date:   Sit to Stand 3  Standing unsupported 4  Sitting with back unsupported but feet supported 4  Stand to sit  2  Transfers  3  Standing unsupported with eyes closed 3  Standing unsupported feet together 3  From standing position, reach forward with outstretched arm 3  From standing position, pick up object from floor 3  From standing position, turn and  look behind over each shoulder 1  Turn 360  2  Standing unsupported, alternately place foot on step 0  Standing unsupported, one foot in front 0  Standing on one leg 0  Total:  31/56     GAIT: Distance walked: 400 Assistive device utilized: Personal assistant bench Level of assistance: Modified independence Comments: guarded, reduced step length and cadence, decreased heel strike and toe off  TREATMENT  [facility] Adult PT Treatment:                                                DATE: 01/02/24 Pt seen for aquatic therapy today.  Treatment took place in water 3.5-4.75 ft in depth at the [facility] pool. Temp of water was 91.  Pt entered/exited the pool via stairs using step to and alternating pattern with hand rail.  *UE on barbell->yellow HB: walking forward, backward and side stepping, multiple widths. *side stepping ue support yellow HB x 2 widths-> RBHB ue add/abd x 2 widths with vc and demonstration *Tandem stance 3.6 ft UE support yellw HB leading R/L x 20 s. Minor unsteadiness. *SLS as above x 20+s *standing ue support yellow HB: 3.6 ft: df; pf;  hip add/abd;  *seated on lift rest->cycling, hip add/abd; LAQ  *squatted rest period *1/2 noodle press for TrA engagement in wide stance then staggered x 5 ea *staggered stances ue horizontal add/abd using RBHB *return to walking *step tapping ue support on hand rails *stair climbing in using a combination of step to and alternating pattern and hand rails indep; out using alternating pattern.  Pt requires the buoyancy and hydrostatic pressure of water for support, and to offload joints by unweighting joint load by at least 50 % in navel deep water and by at least 75-80% in chest to neck deep water.  Viscosity of the water is needed for resistance of strengthening. Water current perturbations provides challenge to standing balance requiring increased core activation.                                                                         PATIENT EDUCATION:  Education details: intro to aquatic therapy  Person educated: Patient Education method: Explanation  Education comprehension: verbalized understanding  HOME EXERCISE PROGRAM: Walking daily   ASSESSMENT:  CLINICAL IMPRESSION: Pt reports she feels she has fully recovered from being ill.  We are able to progress core engagement through advancing balance challenges, decreased foam support, initiated tandem and sls stance. She enjoys session and has good toleration.  No pain.  She does report minor LB fatigue by end of session.    Initial Impression Patient is a 89 y.o. f who was seen today for physical therapy evaluation and treatment for LBP. She sustained a compression fx 5 years ago which continues to cause pain. As per pt, she is afraid of falling and because of that has been very sedentary. Has not had a fall in past year.  Main goals are to improve gait, toleration to amb and improve strength for overall improved function/mobility and health.  Exam indicates decreased strength throughout LE and core, balance and proprioceptive  deficits with increased risk of falls, Left pelvic obliquity and gait deviation.  She will benefit from skilled physical therapy to improve all areas of deficits as well as QOL.  OBJECTIVE IMPAIRMENTS: Abnormal gait, decreased activity tolerance, decreased balance, decreased coordination, decreased knowledge of use of DME, decreased mobility, difficulty walking, decreased strength, postural dysfunction, and pain.   ACTIVITY LIMITATIONS: carrying, lifting, bending, standing, squatting, stairs, transfers, and locomotion level  PARTICIPATION LIMITATIONS: meal prep, cleaning, shopping, and community activity  PERSONAL FACTORS: Age, Fitness, Past/current experiences, and Time since onset of injury/illness/exacerbation are also affecting patient's functional outcome.   REHAB POTENTIAL: Good  CLINICAL DECISION MAKING:  Evolving/moderate complexity  EVALUATION COMPLEXITY: Moderate   GOALS: Goals reviewed with patient? Yes  SHORT TERM GOALS: Target date: 12/08/23  Pt will tolerate full aquatic sessions consistently without increase in pain and with improving function to demonstrate good toleration and effectiveness of intervention.  Baseline: Goal status:MET -12/10/23  2.  Pt will tolerate stair climbing using combination of an alternating and step to pattern ascending and descending 6 steps without use of handrail Baseline:  Goal status: Met 12/31/23  3.  Pt will report reduction of pain submerged to 0/10 Baseline:  Goal status: Met 12/31/23  4.  Pt will begin amb routinely 2-3 x weekly with daughter for exercise as HEP. Baseline:  Goal status: In progress 12/31/23   LONG TERM GOALS: Target date: 02/07/24  Pt to improve on ODI by 13-15 (MCID)  % to demonstrate statistically significant Improvement in function. Baseline:Modified Oswestry 23/50=46%  Goal status: INITIAL   2. Pt will amb x1000 ft without limitation to pain using ad Baseline:  Goal status: INITIAL   3.  Pt will improve on Berg balance test to >/= 40/56 to demonstrate a decrease in fall risk. Baseline: 31/56 Goal status: INITIAL  4.  Pt will improve strength in all areas listed by 1 grade to demonstrate improved overall physical function Baseline:  Goal status: INITIAL  5.  Pt will demonstrate improved gait pattern with more normal step length with heel strike and toe off each step Baseline:  Goal status: INITIAL  6.  Pt will improve on Tug test to <or= 21s (avg for older adults) to demonstrate improvement in lower extremity function, mobility and decreased fall risk. Baseline:  Goal status: INITIAL  PLAN:  PT FREQUENCY: 2x/week  PT DURATION: 8 weeks  PLANNED INTERVENTIONS: 97164- PT Re-evaluation, 97110-Therapeutic exercises, 97530- Therapeutic activity, 97112- Neuromuscular re-education, 97535- Self Care, 02859-  Manual therapy, [phone]- Gait training, [phone]- Aquatic Therapy, [phone]- Electrical stimulation (unattended), [phone]- Ionotophoresis 4mg /ml Dexamethasone, Patient/Family education, Balance training, Stair training, Taping, Dry Needling, Joint mobilization, DME instructions, Cryotherapy, and Moist heat.  PLAN FOR NEXT SESSION: general core and LE strengthening, balance retraining, gait, AD instruction, pain managemen.  Begin aquatics only, transition land after a few weeks  Ronal Kem) Sabine Tenenbaum MPT 01/09/24 12:34 PM [facility] [address] [place], [place], [address] Phone: [phone]   Fax:  [phone]

## 2024-01-14 ENCOUNTER — Encounter (HOSPITAL_BASED_OUTPATIENT_CLINIC_OR_DEPARTMENT_OTHER): Payer: Self-pay | Admitting: Physical Therapy

## 2024-01-14 ENCOUNTER — Ambulatory Visit (HOSPITAL_BASED_OUTPATIENT_CLINIC_OR_DEPARTMENT_OTHER): Admitting: Physical Therapy

## 2024-01-14 DIAGNOSIS — M5459 Other low back pain: Secondary | ICD-10-CM

## 2024-01-14 DIAGNOSIS — M6281 Muscle weakness (generalized): Secondary | ICD-10-CM

## 2024-01-14 DIAGNOSIS — R2681 Unsteadiness on feet: Secondary | ICD-10-CM

## 2024-01-14 NOTE — Therapy (Signed)
 OUTPATIENT PHYSICAL THERAPY THORACOLUMBAR TREATMENT   Patient Name: Julie Park MRN: [id number] DOB:[birth year], 89 y.o., female Today's Date: 01/14/2024  END OF SESSION:  PT End of Session - 01/14/24 1153     Visit Number 8    Number of Visits 16    Date for PT Re-Evaluation 02/07/24    Authorization Type hulan Mcr    PT Start Time 1148    PT Stop Time 1228    PT Time Calculation (min) 40 min    Activity Tolerance Patient tolerated treatment well    Behavior During Therapy [facility] for tasks assessed/performed           Past Medical History:  Diagnosis Date   Anxiety    Chest pain 02/08/15   ETT normal   Diverticulosis    Diverticulitis (1982)   Fracture of rib of right side 08/16/2013   GERD (gastroesophageal reflux disease)    Hyperlipidemia, mixed    Hypothyroidism    IBS (irritable bowel syndrome)    Morton's neuroma    Right lumbar radiculopathy    Intermittent x 2 episodes in summer 2014   Shingles 1967 and 2010   Ulcer    Urine incontinence    Vertigo    Past Surgical History:  Procedure Laterality Date   CARDIOVASCULAR STRESS TEST  02/08/15   ETT normal   CATARACT EXTRACTION  2012   Dr. Cleatus   COLONOSCOPY  2005   [facility]am at [facility] (normal per pt report)   DEXA  11/03/13   Heel T score -0.8.   FOOT NEUROMA SURGERY  1994   right foot   TONSILLECTOMY     TONSILLECTOMY AND ADENOIDECTOMY  1948   Dr. Neomia Benne [facility]   TUBAL LIGATION  [phone]   WISDOM TOOTH EXTRACTION     Patient Active Problem List   Diagnosis Date Noted   Gastritis 12/23/2023   HTN (hypertension) 12/23/2023   Right hip pain 03/12/2023   Cardiac chest pain 08/20/2022   Lower extremity pain 08/20/2022   Gait abnormality 04/25/2021   Nevus of choroid of right eye 08/24/2019   Posterior vitreous detachment of right eye 08/24/2019   Pseudophakia of both eyes 08/24/2019   GAD (generalized anxiety disorder) 07/11/2018   Overactive bladder 07/11/2018   Functional urinary  incontinence 07/10/2018   Hyponatremia 03/31/2014   HLD (hyperlipidemia)    Diverticular disease    Anxiety state    GERD (gastroesophageal reflux disease)    Hypothyroidism    Urine incontinence    IRRITABLE BOWEL SYNDROME 03/02/2010    PCP: Gil Greig BRAVO, NP   REFERRING PROVIDER: Gil Greig BRAVO, NP   REFERRING DIAG: M54.41,M54.42,G89.29 (ICD-10-CM) - Chronic midline low back pain with bilateral sciatica   Rationale for Evaluation and Treatment: Rehabilitation  THERAPY DIAG:  Other low back pain  Unsteadiness on feet  Muscle weakness (generalized)  ONSET DATE: 2020  SUBJECTIVE:  SUBJECTIVE STATEMENT: Pt reports some muscle soreness throughout after last session  POOL ACCESS: neighbor has pool.  She may consider joining [employer]  Initial Subjective Picked up a case of waters in 2020 and had compression fx  (T10 or 12) which has healed but pain has been consistent since then.  I also have oa right hip which hurts.  I am afraid of falling.  I don't move around much in my home.  Use a cane when I lave the house.  As long as I can have 2 fingers on the wall or sofa I can get around my house pretty good.  PERTINENT HISTORY:  Thoracic compression fx 2020 Oa hip right  PAIN:  Are you having pain? no: NPRS scale: 0/10 LB Pain location: mid to lower back Pain description: a Aggravating factors: walking 10 minutes Relieving factors: resting lying down  PRECAUTIONS: Fall  RED FLAGS: None   WEIGHT BEARING RESTRICTIONS: No  FALLS:  Has patient fallen in last 6 months? No  LIVING ENVIRONMENT: Lives with: lives with their family Lives in: House/apartment Stairs: Yes: Internal: 12-14 steps; on right going up and on left going up and External: 0 steps; none Has following equipment at  home: Quad cane large base, Environmental consultant - 4 wheeled, Marine scientist  OCCUPATION: retired  PLOF: Insurance account manager touch with walking through home    PATIENT GOALS: be able to walk for a period of time with little to  no pain in my back. Dtr wants mother to improve gait.  NEXT MD VISIT: as needed  OBJECTIVE:  Note: Objective measures were completed at Evaluation unless otherwise noted.  DIAGNOSTIC FINDINGS:  03/11/24 IMPRESSION: Moderate degenerative joint disease of right hip. No acute abnormality seen.  PATIENT SURVEYS:  Modified Oswestry 23/50=46%   COGNITION: Overall cognitive status: Within functional limits for tasks assessed     SENSATION: WFL  MUSCLE LENGTH: Hamstrings: mild restriction on B Gastrocnemius: moderate restriction on B   POSTURE: rounded shoulders, forward head, and flexed trunk (standing).  Left pelvic obliquity   LUMBAR ROM:   AROM eval  Flexion full  Extension 50% limited P!  Right lateral flexion P!  Left lateral flexion 50% limited P!  Right rotation   Left rotation    (Blank rows = not tested)    LOWER EXTREMITY MMT:    MMT Right eval Left eval  Hip flexion 3+ 3+  Hip extension    Hip abduction 4+ 4+  Hip adduction 5 5  Hip internal rotation    Hip external rotation    Knee flexion 3+ 3+  Knee extension 3+ 3+  Ankle dorsiflexion    Ankle plantarflexion    Ankle inversion    Ankle eversion     (Blank rows = not tested)    FUNCTIONAL TESTS:  30 seconds chair stand test: 3 from pool bench with ue support Timed up and go (TUG): >30s BERG Balance Test          Date:   Sit to Stand 3  Standing unsupported 4  Sitting with back unsupported but feet supported 4  Stand to sit  2  Transfers  3  Standing unsupported with eyes closed 3  Standing unsupported feet together 3  From standing position, reach forward with outstretched arm 3  From standing position, pick up object from floor 3  From standing position, turn and  look behind over each shoulder 1  Turn 360 2  Standing unsupported, alternately place  foot on step 0  Standing unsupported, one foot in front 0  Standing on one leg 0  Total:  31/56     GAIT: Distance walked: 400 Assistive device utilized: Personal assistant bench Level of assistance: Modified independence Comments: guarded, reduced step length and cadence, decreased heel strike and toe off  TREATMENT  [facility] Adult PT Treatment:                                                DATE: 01/14/24 Pt seen for aquatic therapy today.  Treatment took place in water 3.5-4.75 ft in depth at the [facility] pool. Temp of water was 91.  Pt entered/exited the pool via stairs using step to and alternating pattern with hand rail.  *UE on barbell->yellow HB: walking forward, backward and side stepping, multiple widths. *side stepping ue support yellow HB x 2 widths-> RBHB ue add/abd x 2 widths with vc and demonstration *Tandem stance 3.6 ft UE support yellw - RBHB leading R/L x 20 s. Minor unsteadiness. *SLS as above x 20+s *squatted rest period *step ups bottom step leading R/L 2 x 5 *noodle wrapped anteriorly across chest ue support corner wall: hip add/abd; cycling *noodle wrapped posteriorly across chest: working on gaining vertical balance; cycling (good challenge which pt enjoys)  Pt requires the buoyancy and hydrostatic pressure of water for support, and to offload joints by unweighting joint load by at least 50 % in navel deep water and by at least 75-80% in chest to neck deep water.  Viscosity of the water is needed for resistance of strengthening. Water current perturbations provides challenge to standing balance requiring increased core activation.                                                                        PATIENT EDUCATION:  Education details: intro to aquatic therapy  Person educated: Patient Education method: Explanation  Education comprehension:  verbalized understanding  HOME EXERCISE PROGRAM: Walking daily   ASSESSMENT:  CLINICAL IMPRESSION: Dialed back session slightly allowing for added recovery periods to reduce post session muscle soreness.  Continue to advance balance challenges with good toleration.  Pt progressed to ue support of RB HB (single foam) from yellow (double). No LOB with improved core stabilization. Began vertical suspension with increase core engagement. All tolerated well.  Pt enjoyed session. Goals ongoing      Initial Impression Patient is a 89 y.o. f who was seen today for physical therapy evaluation and treatment for LBP. She sustained a compression fx 5 years ago which continues to cause pain. As per pt, she is afraid of falling and because of that has been very sedentary. Has not had a fall in past year.  Main goals are to improve gait, toleration to amb and improve strength for overall improved function/mobility and health.  Exam indicates decreased strength throughout LE and core, balance and proprioceptive deficits with increased risk of falls, Left pelvic obliquity and gait deviation.  She will benefit from skilled physical therapy to improve all areas of deficits as well as QOL.  OBJECTIVE IMPAIRMENTS: Abnormal gait, decreased activity tolerance, decreased balance, decreased coordination, decreased knowledge of use of DME, decreased mobility, difficulty walking, decreased strength, postural dysfunction, and pain.   ACTIVITY LIMITATIONS: carrying, lifting, bending, standing, squatting, stairs, transfers, and locomotion level  PARTICIPATION LIMITATIONS: meal prep, cleaning, shopping, and community activity  PERSONAL FACTORS: Age, Fitness, Past/current experiences, and Time since onset of injury/illness/exacerbation are also affecting patient's functional outcome.   REHAB POTENTIAL: Good  CLINICAL DECISION MAKING: Evolving/moderate complexity  EVALUATION COMPLEXITY: Moderate   GOALS: Goals  reviewed with patient? Yes  SHORT TERM GOALS: Target date: 12/08/23  Pt will tolerate full aquatic sessions consistently without increase in pain and with improving function to demonstrate good toleration and effectiveness of intervention.  Baseline: Goal status:MET -12/10/23  2.  Pt will tolerate stair climbing using combination of an alternating and step to pattern ascending and descending 6 steps without use of handrail Baseline:  Goal status: Met 12/31/23  3.  Pt will report reduction of pain submerged to 0/10 Baseline:  Goal status: Met 12/31/23  4.  Pt will begin amb routinely 2-3 x weekly with daughter for exercise as HEP. Baseline:  Goal status: In progress 12/31/23   LONG TERM GOALS: Target date: 02/07/24  Pt to improve on ODI by 13-15 (MCID)  % to demonstrate statistically significant Improvement in function. Baseline:Modified Oswestry 23/50=46%  Goal status: INITIAL   2. Pt will amb x1000 ft without limitation to pain using ad Baseline:  Goal status: INITIAL   3.  Pt will improve on Berg balance test to >/= 40/56 to demonstrate a decrease in fall risk. Baseline: 31/56 Goal status: INITIAL  4.  Pt will improve strength in all areas listed by 1 grade to demonstrate improved overall physical function Baseline:  Goal status: INITIAL  5.  Pt will demonstrate improved gait pattern with more normal step length with heel strike and toe off each step Baseline:  Goal status: INITIAL  6.  Pt will improve on Tug test to <or= 21s (avg for older adults) to demonstrate improvement in lower extremity function, mobility and decreased fall risk. Baseline:  Goal status: INITIAL  PLAN:  PT FREQUENCY: 2x/week  PT DURATION: 8 weeks  PLANNED INTERVENTIONS: 97164- PT Re-evaluation, 97110-Therapeutic exercises, 97530- Therapeutic activity, 97112- Neuromuscular re-education, 97535- Self Care, 02859- Manual therapy, [phone]- Gait training, [phone]- Aquatic Therapy, [phone]- Electrical  stimulation (unattended), [phone]- Ionotophoresis 4mg /ml Dexamethasone, Patient/Family education, Balance training, Stair training, Taping, Dry Needling, Joint mobilization, DME instructions, Cryotherapy, and Moist heat.  PLAN FOR NEXT SESSION: general core and LE strengthening, balance retraining, gait, AD instruction, pain managemen.  Begin aquatics only, transition land after a few weeks  Ronal Kem) Aeron Lheureux MPT 01/14/24 11:54 AM [facility] [address] [place], [place], [address] Phone: [phone]   Fax:  [phone]

## 2024-01-14 NOTE — Therapy (Unsigned)
 OUTPATIENT PHYSICAL THERAPY THORACOLUMBAR TREATMENT   Patient Name: Julie Park MRN: [id number] DOB:[birth year], 89 y.o., female Today's Date: 01/16/2024  END OF SESSION:  PT End of Session - 01/16/24 1059     Visit Number 9    Number of Visits 16    Date for PT Re-Evaluation 02/07/24    Authorization Type aetna Mcr    PT Start Time 1100    PT Stop Time 1142    PT Time Calculation (min) 42 min    Activity Tolerance Patient tolerated treatment well    Behavior During Therapy [facility] for tasks assessed/performed            Past Medical History:  Diagnosis Date   Anxiety    Chest pain 02/08/15   ETT normal   Diverticulosis    Diverticulitis (1982)   Fracture of rib of right side 08/16/2013   GERD (gastroesophageal reflux disease)    Hyperlipidemia, mixed    Hypothyroidism    IBS (irritable bowel syndrome)    Morton's neuroma    Right lumbar radiculopathy    Intermittent x 2 episodes in summer 2014   Shingles 1967 and 2010   Ulcer    Urine incontinence    Vertigo    Past Surgical History:  Procedure Laterality Date   CARDIOVASCULAR STRESS TEST  02/08/15   ETT normal   CATARACT EXTRACTION  2012   Dr. Cleatus   COLONOSCOPY  2005   Dr. Myriam at [facility] (normal per pt report)   DEXA  11/03/13   Heel T score -0.8.   FOOT NEUROMA SURGERY  1994   right foot   TONSILLECTOMY     TONSILLECTOMY AND ADENOIDECTOMY  1948   Dr. Neomia Benne [facility]   TUBAL LIGATION  [phone]   WISDOM TOOTH EXTRACTION     Patient Active Problem List   Diagnosis Date Noted   Gastritis 12/23/2023   HTN (hypertension) 12/23/2023   Right hip pain 03/12/2023   Cardiac chest pain 08/20/2022   Lower extremity pain 08/20/2022   Gait abnormality 04/25/2021   Nevus of choroid of right eye 08/24/2019   Posterior vitreous detachment of right eye 08/24/2019   Pseudophakia of both eyes 08/24/2019   GAD (generalized anxiety disorder) 07/11/2018   Overactive bladder 07/11/2018   Functional  urinary incontinence 07/10/2018   Hyponatremia 03/31/2014   HLD (hyperlipidemia)    Diverticular disease    Anxiety state    GERD (gastroesophageal reflux disease)    Hypothyroidism    Urine incontinence    IRRITABLE BOWEL SYNDROME 03/02/2010    PCP: Gil Greig BRAVO, NP   REFERRING PROVIDER: Gil Greig BRAVO, NP   REFERRING DIAG: M54.41,M54.42,G89.29 (ICD-10-CM) - Chronic midline low back pain with bilateral sciatica   Rationale for Evaluation and Treatment: Rehabilitation  THERAPY DIAG:  Other low back pain  Unsteadiness on feet  Muscle weakness (generalized)  Difficulty in walking, not elsewhere classified  Pain in right hip  ONSET DATE: 2020  SUBJECTIVE:  SUBJECTIVE STATEMENT: Pt states that she is having a lot of muscle soreness from her last aquatic session. She states that she tried to go into the deep end, and feels as though that may have been too much.   POOL ACCESS: neighbor has pool.  She may consider joining [employer]  Initial Subjective Picked up a case of waters in 2020 and had compression fx  (T10 or 12) which has healed but pain has been consistent since then.  I also have oa right hip which hurts.  I am afraid of falling.  I don't move around much in my home.  Use a cane when I lave the house.  As long as I can have 2 fingers on the wall or sofa I can get around my house pretty good.  PERTINENT HISTORY:  Thoracic compression fx 2020 Oa hip right  PAIN:  Are you having pain? no: NPRS scale: 0/10 LB Pain location: mid to lower back Pain description: a Aggravating factors: walking 10 minutes Relieving factors: resting lying down  PRECAUTIONS: Fall  RED FLAGS: None   WEIGHT BEARING RESTRICTIONS: No  FALLS:  Has patient fallen in last 6 months? No  LIVING  ENVIRONMENT: Lives with: lives with their family Lives in: House/apartment Stairs: Yes: Internal: 12-14 steps; on right going up and on left going up and External: 0 steps; none Has following equipment at home: Quad cane large base, Environmental consultant - 4 wheeled, Marine scientist  OCCUPATION: retired  PLOF: Insurance account manager touch with walking through home    PATIENT GOALS: be able to walk for a period of time with little to  no pain in my back. Dtr wants mother to improve gait.  NEXT MD VISIT: as needed  OBJECTIVE:  Note: Objective measures were completed at Evaluation unless otherwise noted.  DIAGNOSTIC FINDINGS:  03/11/24 IMPRESSION: Moderate degenerative joint disease of right hip. No acute abnormality seen.  PATIENT SURVEYS:  Modified Oswestry 23/50=46%   COGNITION: Overall cognitive status: Within functional limits for tasks assessed     SENSATION: WFL  MUSCLE LENGTH: Hamstrings: mild restriction on B Gastrocnemius: moderate restriction on B   POSTURE: rounded shoulders, forward head, and flexed trunk (standing).  Left pelvic obliquity   LUMBAR ROM:   AROM eval  Flexion full  Extension 50% limited P!  Right lateral flexion P!  Left lateral flexion 50% limited P!  Right rotation   Left rotation    (Blank rows = not tested)    LOWER EXTREMITY MMT:    MMT Right eval Left eval  Hip flexion 3+ 3+  Hip extension    Hip abduction 4+ 4+  Hip adduction 5 5  Hip internal rotation    Hip external rotation    Knee flexion 3+ 3+  Knee extension 3+ 3+  Ankle dorsiflexion    Ankle plantarflexion    Ankle inversion    Ankle eversion     (Blank rows = not tested)    FUNCTIONAL TESTS:  30 seconds chair stand test: 3 from pool bench with ue support Timed up and go (TUG): >30s BERG Balance Test          Date:   Sit to Stand 3  Standing unsupported 4  Sitting with back unsupported but feet supported 4  Stand to sit  2  Transfers  3  Standing unsupported  with eyes closed 3  Standing unsupported feet together 3  From standing position, reach forward with outstretched arm 3  From  standing position, pick up object from floor 3  From standing position, turn and look behind over each shoulder 1  Turn 360 2  Standing unsupported, alternately place foot on step 0  Standing unsupported, one foot in front 0  Standing on one leg 0  Total:  31/56     GAIT: Distance walked: 400 Assistive device utilized: Quad cane small base and Shower bench Level of assistance: Modified independence Comments: guarded, reduced step length and cadence, decreased heel strike and toe off  TREATMENT  [facility] Adult PT Treatment:                                                 01/16/2024 - LTR - PPT  - Bridges  - supine ball squeezes  - supine clams with RTB  - TrA acitation 5 sec holds.  - TrA activation with RTB supine marches - Sit to stand with 5lb kettle bell - LAQ     DATE: 01/14/24 Pt seen for aquatic therapy today.  Treatment took place in water 3.5-4.75 ft in depth at the [facility] pool. Temp of water was 91.  Pt entered/exited the pool via stairs using step to and alternating pattern with hand rail.  *UE on barbell->yellow HB: walking forward, backward and side stepping, multiple widths. *side stepping ue support yellow HB x 2 widths-> RBHB ue add/abd x 2 widths with vc and demonstration *Tandem stance 3.6 ft UE support yellw - RBHB leading R/L x 20 s. Minor unsteadiness. *SLS as above x 20+s *squatted rest period *step ups bottom step leading R/L 2 x 5 *noodle wrapped anteriorly across chest ue support corner wall: hip add/abd; cycling *noodle wrapped posteriorly across chest: working on gaining vertical balance; cycling (good challenge which pt enjoys)  Pt requires the buoyancy and hydrostatic pressure of water for support, and to offload joints by unweighting joint load by at least 50 % in navel deep water and by at least 75-80% in  chest to neck deep water.  Viscosity of the water is needed for resistance of strengthening. Water current perturbations provides challenge to standing balance requiring increased core activation.                                                                        PATIENT EDUCATION:  Education details: intro to aquatic therapy  Person educated: Patient Education method: Explanation  Education comprehension: verbalized understanding  HOME EXERCISE PROGRAM: Walking daily   ASSESSMENT:  CLINICAL IMPRESSION: Pt attended her first land therapy appt. She tolerated all prescribed exercises well with cues needed for proper form. She denies any pain, but reports mild fatigue. She will benefit from continued strengtheing focusing on core, LE and balance. Pt would also like to work on postural control and gait. Pt will continue to benefit from skilled PT to address continued deficits.    Initial Impression Patient is a 89 y.o. f who was seen today for physical therapy evaluation and treatment for LBP. She sustained a compression fx 5 years ago which continues to cause pain. As per pt, she is afraid of  falling and because of that has been very sedentary. Has not had a fall in past year.  Main goals are to improve gait, toleration to amb and improve strength for overall improved function/mobility and health.  Exam indicates decreased strength throughout LE and core, balance and proprioceptive deficits with increased risk of falls, Left pelvic obliquity and gait deviation.  She will benefit from skilled physical therapy to improve all areas of deficits as well as QOL.  OBJECTIVE IMPAIRMENTS: Abnormal gait, decreased activity tolerance, decreased balance, decreased coordination, decreased knowledge of use of DME, decreased mobility, difficulty walking, decreased strength, postural dysfunction, and pain.   ACTIVITY LIMITATIONS: carrying, lifting, bending, standing, squatting, stairs, transfers, and  locomotion level  PARTICIPATION LIMITATIONS: meal prep, cleaning, shopping, and community activity  PERSONAL FACTORS: Age, Fitness, Past/current experiences, and Time since onset of injury/illness/exacerbation are also affecting patient's functional outcome.   REHAB POTENTIAL: Good  CLINICAL DECISION MAKING: Evolving/moderate complexity  EVALUATION COMPLEXITY: Moderate   GOALS: Goals reviewed with patient? Yes  SHORT TERM GOALS: Target date: 12/08/23  Pt will tolerate full aquatic sessions consistently without increase in pain and with improving function to demonstrate good toleration and effectiveness of intervention.  Baseline: Goal status:MET -12/10/23  2.  Pt will tolerate stair climbing using combination of an alternating and step to pattern ascending and descending 6 steps without use of handrail Baseline:  Goal status: Met 12/31/23  3.  Pt will report reduction of pain submerged to 0/10 Baseline:  Goal status: Met 12/31/23  4.  Pt will begin amb routinely 2-3 x weekly with daughter for exercise as HEP. Baseline:  Goal status: In progress 12/31/23   LONG TERM GOALS: Target date: 02/07/24  Pt to improve on ODI by 13-15 (MCID)  % to demonstrate statistically significant Improvement in function. Baseline:Modified Oswestry 23/50=46%  Goal status: INITIAL   2. Pt will amb x1000 ft without limitation to pain using ad Baseline:  Goal status: INITIAL   3.  Pt will improve on Berg balance test to >/= 40/56 to demonstrate a decrease in fall risk. Baseline: 31/56 Goal status: INITIAL  4.  Pt will improve strength in all areas listed by 1 grade to demonstrate improved overall physical function Baseline:  Goal status: INITIAL  5.  Pt will demonstrate improved gait pattern with more normal step length with heel strike and toe off each step Baseline:  Goal status: INITIAL  6.  Pt will improve on Tug test to <or= 21s (avg for older adults) to demonstrate improvement in lower  extremity function, mobility and decreased fall risk. Baseline:  Goal status: INITIAL  PLAN:  PT FREQUENCY: 2x/week  PT DURATION: 8 weeks  PLANNED INTERVENTIONS: 97164- PT Re-evaluation, 97110-Therapeutic exercises, 97530- Therapeutic activity, 97112- Neuromuscular re-education, 97535- Self Care, [id number]- Manual therapy, [phone]- Gait training, [phone]- Aquatic Therapy, [phone]- Electrical stimulation (unattended), [phone]- Ionotophoresis 4mg /ml Dexamethasone, Patient/Family education, Balance training, Stair training, Taping, Dry Needling, Joint mobilization, DME instructions, Cryotherapy, and Moist heat.  PLAN FOR NEXT SESSION: general core and LE strengthening, balance retraining, gait, AD instruction, pain managemen.  Begin aquatics only, transition land after a few weeks  Rojean Batten PT, DPT 01/16/24  11:46 AM

## 2024-01-16 ENCOUNTER — Telehealth: Payer: Self-pay | Admitting: *Deleted

## 2024-01-16 ENCOUNTER — Ambulatory Visit (HOSPITAL_BASED_OUTPATIENT_CLINIC_OR_DEPARTMENT_OTHER): Admitting: Physical Therapy

## 2024-01-16 ENCOUNTER — Encounter (HOSPITAL_BASED_OUTPATIENT_CLINIC_OR_DEPARTMENT_OTHER): Payer: Self-pay | Admitting: Physical Therapy

## 2024-01-16 DIAGNOSIS — M25551 Pain in right hip: Secondary | ICD-10-CM

## 2024-01-16 DIAGNOSIS — M5459 Other low back pain: Secondary | ICD-10-CM | POA: Diagnosis not present

## 2024-01-16 DIAGNOSIS — R2681 Unsteadiness on feet: Secondary | ICD-10-CM

## 2024-01-16 DIAGNOSIS — M6281 Muscle weakness (generalized): Secondary | ICD-10-CM

## 2024-01-16 DIAGNOSIS — R262 Difficulty in walking, not elsewhere classified: Secondary | ICD-10-CM

## 2024-01-16 NOTE — Telephone Encounter (Signed)
 Received Verification from [employer] for Prolia . Shows Copay to be 20%. Patient had no copay with previous Verification. Called and spoke with [employer] Rep, Eda, and she is going to rerun benefits.    Authorization submitted through [employer] Pending Reference #: [id number]

## 2024-01-20 NOTE — Telephone Encounter (Signed)
 Received Prior Authorization from [place] and Prolia  was APPROVED 01/16/24-01/15/25  Auth #:[id number] Case #:[id number]

## 2024-01-21 ENCOUNTER — Encounter (HOSPITAL_BASED_OUTPATIENT_CLINIC_OR_DEPARTMENT_OTHER): Payer: Self-pay | Admitting: Physical Therapy

## 2024-01-21 ENCOUNTER — Ambulatory Visit (HOSPITAL_BASED_OUTPATIENT_CLINIC_OR_DEPARTMENT_OTHER): Admitting: Physical Therapy

## 2024-01-21 DIAGNOSIS — M5459 Other low back pain: Secondary | ICD-10-CM | POA: Diagnosis not present

## 2024-01-21 DIAGNOSIS — M6281 Muscle weakness (generalized): Secondary | ICD-10-CM

## 2024-01-21 DIAGNOSIS — R2681 Unsteadiness on feet: Secondary | ICD-10-CM

## 2024-01-21 NOTE — Telephone Encounter (Addendum)
 [employer] Verification for Prolia  [place] and No Copay and PA  PA was APPROVED.

## 2024-01-21 NOTE — Therapy (Addendum)
 OUTPATIENT PHYSICAL THERAPY THORACOLUMBAR TREATMENT Progress Note Reporting Period 01/21/24 to 11/11/23  See note below for Objective Data and Assessment of Progress/Goals.      Patient Name: Julie Park MRN: [id number] DOB:17-May-1934, 89 y.o., female Today's Date: 01/21/2024  END OF SESSION:  PT End of Session - 01/21/24 1221     Visit Number 10    Number of Visits 16    Date for PT Re-Evaluation 02/07/24    Authorization Type hulan Mcr    PT Start Time 1147    PT Stop Time 1230    PT Time Calculation (min) 43 min    Activity Tolerance Patient tolerated treatment well    Behavior During Therapy [facility] for tasks assessed/performed             Past Medical History:  Diagnosis Date   Anxiety    Chest pain 02/08/15   ETT normal   Diverticulosis    Diverticulitis (1982)   Fracture of rib of right side 08/16/2013   GERD (gastroesophageal reflux disease)    Hyperlipidemia, mixed    Hypothyroidism    IBS (irritable bowel syndrome)    Morton's neuroma    Right lumbar radiculopathy    Intermittent x 2 episodes in summer 2014   Shingles 1967 and 2010   Ulcer    Urine incontinence    Vertigo    Past Surgical History:  Procedure Laterality Date   CARDIOVASCULAR STRESS TEST  02/08/15   ETT normal   CATARACT EXTRACTION  2012   Dr. Cleatus   COLONOSCOPY  2005   Dr. Myriam at [facility] (normal per pt report)   DEXA  11/03/13   Heel T score -0.8.   FOOT NEUROMA SURGERY  1994   right foot   TONSILLECTOMY     TONSILLECTOMY AND ADENOIDECTOMY  1948   Dr. Neomia Benne [facility]   TUBAL LIGATION  [phone]   WISDOM TOOTH EXTRACTION     Patient Active Problem List   Diagnosis Date Noted   Gastritis 12/23/2023   HTN (hypertension) 12/23/2023   Right hip pain 03/12/2023   Cardiac chest pain 08/20/2022   Lower extremity pain 08/20/2022   Gait abnormality 04/25/2021   Nevus of choroid of right eye 08/24/2019   Posterior vitreous detachment of right eye 08/24/2019    Pseudophakia of both eyes 08/24/2019   GAD (generalized anxiety disorder) 07/11/2018   Overactive bladder 07/11/2018   Functional urinary incontinence 07/10/2018   Hyponatremia 03/31/2014   HLD (hyperlipidemia)    Diverticular disease    Anxiety state    GERD (gastroesophageal reflux disease)    Hypothyroidism    Urine incontinence    IRRITABLE BOWEL SYNDROME 03/02/2010    PCP: Gil Greig BRAVO, NP   REFERRING PROVIDER: Gil Greig BRAVO, NP   REFERRING DIAG: M54.41,M54.42,G89.29 (ICD-10-CM) - Chronic midline low back pain with bilateral sciatica   Rationale for Evaluation and Treatment: Rehabilitation  THERAPY DIAG:  Other low back pain  Unsteadiness on feet  Muscle weakness (generalized)  ONSET DATE: 2020  SUBJECTIVE:  SUBJECTIVE STATEMENT: Pt states she enjoyed land based session. Feels like it helped. Pain low  POOL ACCESS: neighbor has pool.  She may consider joining [employer]  Initial Subjective Picked up a case of waters in 2020 and had compression fx  (T10 or 12) which has healed but pain has been consistent since then.  I also have oa right hip which hurts.  I am afraid of falling.  I don't move around much in my home.  Use a cane when I lave the house.  As long as I can have 2 fingers on the wall or sofa I can get around my house pretty good.  PERTINENT HISTORY:  Thoracic compression fx 2020 Oa hip right  PAIN:  Are you having pain? no: NPRS scale: 0/10 LB Pain location: mid to lower back Pain description: a Aggravating factors: walking 10 minutes Relieving factors: resting lying down  PRECAUTIONS: Fall  RED FLAGS: None   WEIGHT BEARING RESTRICTIONS: No  FALLS:  Has patient fallen in last 6 months? No  LIVING ENVIRONMENT: Lives with: lives with their family Lives  in: House/apartment Stairs: Yes: Internal: 12-14 steps; on right going up and on left going up and External: 0 steps; none Has following equipment at home: Quad cane large base, Environmental consultant - 4 wheeled, Marine scientist  OCCUPATION: retired  PLOF: Insurance account manager touch with walking through home    PATIENT GOALS: be able to walk for a period of time with little to  no pain in my back. Dtr wants mother to improve gait.  NEXT MD VISIT: as needed  OBJECTIVE:  Note: Objective measures were completed at Evaluation unless otherwise noted.  DIAGNOSTIC FINDINGS:  03/11/24 IMPRESSION: Moderate degenerative joint disease of right hip. No acute abnormality seen.  PATIENT SURVEYS:  Modified Oswestry 23/50=46%   COGNITION: Overall cognitive status: Within functional limits for tasks assessed     SENSATION: WFL  MUSCLE LENGTH: Hamstrings: mild restriction on B Gastrocnemius: moderate restriction on B   POSTURE: rounded shoulders, forward head, and flexed trunk (standing).  Left pelvic obliquity   LUMBAR ROM:   AROM eval  Flexion full  Extension 50% limited P!  Right lateral flexion P!  Left lateral flexion 50% limited P!  Right rotation   Left rotation    (Blank rows = not tested)    LOWER EXTREMITY MMT:    MMT Right eval Left eval R / L 01/21/24  Hip flexion 3+ 3+ 4 / 4  Hip extension     Hip abduction 4+ 4+ 5- / 5-  Hip adduction 5 5   Hip internal rotation     Hip external rotation     Knee flexion 3+ 3+ 4 / 4  Knee extension 3+ 3+ 4 / 4  Ankle dorsiflexion     Ankle plantarflexion     Ankle inversion     Ankle eversion      (Blank rows = not tested)    FUNCTIONAL TESTS:  30 seconds chair stand test: 3 from pool bench with ue support Timed up and go (TUG): >30s BERG Balance Test          Date: eval  Sit to Stand 3  Standing unsupported 4  Sitting with back unsupported but feet supported 4  Stand to sit  2  Transfers  3  Standing unsupported with  eyes closed 3  Standing unsupported feet together 3  From standing position, reach forward with outstretched arm 3  From standing  position, pick up object from floor 3  From standing position, turn and look behind over each shoulder 1  Turn 360 2  Standing unsupported, alternately place foot on step 0  Standing unsupported, one foot in front 0  Standing on one leg 0  Total:  31/56   7/29 3. able to stand independently using hands  4. able to stand safely for 2 minutes  4. able to sit safely and securely for 2 minutes   4. sits safely with minimal use of hands  3. able to transfer safely with definite need of hands  4. able to stand 10 seconds safely  3. able to place feet together independently and stand 1 minute with supervision  3. can reach forward 12 cm (5 inches)    3. able to pick up slipper but needs supervision  2. turns sideways but only maintains balance    2. able to turn 360 degrees safely but slowly  2. able to complete 4 steps without aid with supervision    1. needs help to step but can hold 15 seconds   2. able to lift leg independently and hold >= 3 seconds   40/56     GAIT: Distance walked: 400 Assistive device utilized: Quad cane small base and Shower bench Level of assistance: Modified independence Comments: guarded, reduced step length and cadence, decreased heel strike and toe off  TREATMENT  DATE: 01/21/24 Pt seen for aquatic therapy today.  Treatment took place in water 3.5-4.75 ft in depth at the [facility] pool. Temp of water was 91.  Pt entered/exited the pool via stairs using step to and alternating pattern with hand rail.  *UE on barbell->yellow HB: walking forward, backward and side stepping, multiple widths. *step ups bottom step leading R/L 2 x 5 *side stepping ue support RBHB x 2 widths *Tandem stance 3.6 ft UE support  RBHB leading R/L x 20 s. Minor unsteadiness. *SLS as above x 20+s *seated on lift: cycling; hip  add/abd  Pt requires the buoyancy and hydrostatic pressure of water for support, and to offload joints by unweighting joint load by at least 50 % in navel deep water and by at least 75-80% in chest to neck deep water.  Viscosity of the water is needed for resistance of strengthening. Water current perturbations provides challenge to standing balance requiring increased core activation.      Berg MMT   [facility] Adult PT Treatment:                                                 01/16/2024 - LTR - PPT  - Bridges  - supine ball squeezes  - supine clams with RTB  - TrA acitation 5 sec holds.  - TrA activation with RTB supine marches - Sit to stand with 5lb kettle bell - LAQ      PATIENT EDUCATION:  Education details: intro to aquatic therapy  Person educated: Patient Education method: Explanation  Education comprehension: verbalized understanding  HOME EXERCISE PROGRAM: Walking daily   ASSESSMENT:  CLINICAL IMPRESSION: PN: Pt reports good response to 1st land session.  No pain or excessive fatigue. She demonstrates improvements today in [place] increasing by 9 points reducing her fall risk.  Strength improved by at least 1/2 grade in hips.  Dtr reports pt is more mobile at home.  She  has not yet begun walking a few times a week to and from mailbox due to the extreme hot weather. She is progressed now transitioning from aquatics to land increasing load for increased strengthening with good toleration.  Multiple goals met.  She will continue to benefit from skilled PT in both aquatic na dland settings to address continued deficits.     Initial Impression Patient is a 89 y.o. f who was seen today for physical therapy evaluation and treatment for LBP. She sustained a compression fx 5 years ago which continues to cause pain. As per pt, she is afraid of falling and because of that has been very sedentary. Has not had a fall in past year.  Main goals are to improve gait, toleration to amb  and improve strength for overall improved function/mobility and health.  Exam indicates decreased strength throughout LE and core, balance and proprioceptive deficits with increased risk of falls, Left pelvic obliquity and gait deviation.  She will benefit from skilled physical therapy to improve all areas of deficits as well as QOL.  OBJECTIVE IMPAIRMENTS: Abnormal gait, decreased activity tolerance, decreased balance, decreased coordination, decreased knowledge of use of DME, decreased mobility, difficulty walking, decreased strength, postural dysfunction, and pain.   ACTIVITY LIMITATIONS: carrying, lifting, bending, standing, squatting, stairs, transfers, and locomotion level  PARTICIPATION LIMITATIONS: meal prep, cleaning, shopping, and community activity  PERSONAL FACTORS: Age, Fitness, Past/current experiences, and Time since onset of injury/illness/exacerbation are also affecting patient's functional outcome.   REHAB POTENTIAL: Good  CLINICAL DECISION MAKING: Evolving/moderate complexity  EVALUATION COMPLEXITY: Moderate   GOALS: Goals reviewed with patient? Yes  SHORT TERM GOALS: Target date: 12/08/23  Pt will tolerate full aquatic sessions consistently without increase in pain and with improving function to demonstrate good toleration and effectiveness of intervention.  Baseline: Goal status:MET -12/10/23  2.  Pt will tolerate stair climbing using combination of an alternating and step to pattern ascending and descending 6 steps without use of handrail Baseline:  Goal status: Met 12/31/23  3.  Pt will report reduction of pain submerged to 0/10 Baseline:  Goal status: Met 12/31/23  4.  Pt will begin amb routinely 2-3 x weekly with daughter for exercise as HEP. Baseline:  Goal status: In progress 12/31/23; 01/21/24   LONG TERM GOALS: Target date: 02/07/24  Pt to improve on ODI by 13-15 (MCID)  % to demonstrate statistically significant Improvement in function. Baseline:Modified  Oswestry 23/50=46%  Goal status: INITIAL   2. Pt will amb x1000 ft without limitation to pain using ad Baseline:  Goal status: In progress 01/21/24  3.  Pt will improve on Berg balance test to >/= 40/56 to demonstrate a decrease in fall risk. Baseline: 31/56 Goal status: Met 01/21/24  4.  Pt will improve strength in all areas listed by 1 grade to demonstrate improved overall physical function Baseline:  Goal status: In progress 01/21/24  5.  Pt will demonstrate improved gait pattern with more normal step length with heel strike and toe off each step Baseline:  Goal status: INITIAL  6.  Pt will improve on Tug test to <or= 21s (avg for older adults) to demonstrate improvement in lower extremity function, mobility and decreased fall risk. Baseline:  Goal status: INITIAL  PLAN:  PT FREQUENCY: 2x/week  PT DURATION: 8 weeks  PLANNED INTERVENTIONS: 97164- PT Re-evaluation, 97110-Therapeutic exercises, 97530- Therapeutic activity, [id number]- Neuromuscular re-education, 97535- Self Care, [id number]- Manual therapy, [id number]- Gait training, [phone]- Aquatic Therapy, [phone]- Electrical stimulation (unattended), [phone]- Ionotophoresis 4mg /ml  Dexamethasone, Patient/Family education, Balance training, Stair training, Taping, Dry Needling, Joint mobilization, DME instructions, Cryotherapy, and Moist heat.  PLAN FOR NEXT SESSION: general core and LE strengthening, balance retraining, gait, AD instruction, pain managemen.  Begin aquatics only, transition land after a few weeks  Ronal Kem) Paraskevi Funez MPT 01/21/24 12:47 PM [facility] [address] [place], [place], [address] Phone: [phone]   Fax:  [phone]   Addend Ronal Kem) Izaan Kingbird MPT 01/28/24 12:07 PM [facility] [address] [place], [place], [address] Phone: [phone]   Fax:  [phone]

## 2024-01-22 ENCOUNTER — Ambulatory Visit (INDEPENDENT_AMBULATORY_CARE_PROVIDER_SITE_OTHER): Admitting: Family Medicine

## 2024-01-22 ENCOUNTER — Encounter: Payer: Self-pay | Admitting: Family Medicine

## 2024-01-22 VITALS — BP 119/49 | HR 60 | Ht 64.0 in | Wt 173.0 lb

## 2024-01-22 DIAGNOSIS — K219 Gastro-esophageal reflux disease without esophagitis: Secondary | ICD-10-CM

## 2024-01-22 DIAGNOSIS — Z79899 Other long term (current) drug therapy: Secondary | ICD-10-CM | POA: Insufficient documentation

## 2024-01-22 DIAGNOSIS — I10 Essential (primary) hypertension: Secondary | ICD-10-CM | POA: Diagnosis not present

## 2024-01-22 DIAGNOSIS — Z131 Encounter for screening for diabetes mellitus: Secondary | ICD-10-CM

## 2024-01-22 DIAGNOSIS — Z0000 Encounter for general adult medical examination without abnormal findings: Secondary | ICD-10-CM | POA: Diagnosis not present

## 2024-01-22 DIAGNOSIS — M545 Low back pain, unspecified: Secondary | ICD-10-CM

## 2024-01-22 DIAGNOSIS — E039 Hypothyroidism, unspecified: Secondary | ICD-10-CM | POA: Diagnosis not present

## 2024-01-22 DIAGNOSIS — Z1231 Encounter for screening mammogram for malignant neoplasm of breast: Secondary | ICD-10-CM

## 2024-01-22 DIAGNOSIS — F411 Generalized anxiety disorder: Secondary | ICD-10-CM

## 2024-01-22 DIAGNOSIS — E871 Hypo-osmolality and hyponatremia: Secondary | ICD-10-CM

## 2024-01-22 DIAGNOSIS — E7849 Other hyperlipidemia: Secondary | ICD-10-CM

## 2024-01-22 DIAGNOSIS — G8929 Other chronic pain: Secondary | ICD-10-CM | POA: Insufficient documentation

## 2024-01-22 LAB — COMPREHENSIVE METABOLIC PANEL WITH GFR
ALT: 13 U/L (ref 0–35)
AST: 17 U/L (ref 0–37)
Albumin: 3.9 g/dL (ref 3.5–5.2)
Alkaline Phosphatase: 66 U/L (ref 39–117)
BUN: 11 mg/dL (ref 6–23)
CO2: 27 meq/L (ref 19–32)
Calcium: 8.9 mg/dL (ref 8.4–10.5)
Chloride: 103 meq/L (ref 96–112)
Creatinine, Ser: 0.9 mg/dL (ref 0.40–1.20)
GFR: 57.12 mL/min — ABNORMAL LOW (ref >60.00)
Glucose, Bld: 82 mg/dL (ref 70–99)
Potassium: 4.6 meq/L (ref 3.5–5.1)
Sodium: 137 meq/L (ref 135–145)
Total Bilirubin: 0.6 mg/dL (ref 0.2–1.2)
Total Protein: 6.4 g/dL (ref 6.0–8.3)

## 2024-01-22 LAB — CBC WITH DIFFERENTIAL/PLATELET
Basophils Absolute: 0 K/uL (ref 0.0–0.1)
Basophils Relative: 0.9 % (ref 0.0–3.0)
Eosinophils Absolute: 0.1 K/uL (ref 0.0–0.7)
Eosinophils Relative: 3.1 % (ref 0.0–5.0)
HCT: 42.3 % (ref 36.0–46.0)
Hemoglobin: 13.8 g/dL (ref 12.0–15.0)
Lymphocytes Relative: 29.5 % (ref 12.0–46.0)
Lymphs Abs: 1.4 K/uL (ref 0.7–4.0)
MCHC: 32.6 g/dL (ref 30.0–36.0)
MCV: 90.3 fl (ref 78.0–100.0)
Monocytes Absolute: 0.6 K/uL (ref 0.1–1.0)
Monocytes Relative: 12.7 % — ABNORMAL HIGH (ref 3.0–12.0)
Neutro Abs: 2.5 K/uL (ref 1.4–7.7)
Neutrophils Relative %: 53.8 % (ref 43.0–77.0)
Platelets: 235 K/uL (ref 150.0–400.0)
RBC: 4.68 Mil/uL (ref 3.87–5.11)
RDW: 14.2 % (ref 11.5–15.5)
WBC: 4.7 K/uL (ref 4.0–10.5)

## 2024-01-22 LAB — LIPID PANEL
Cholesterol: 139 mg/dL (ref 0–200)
HDL: 46.8 mg/dL (ref >39.00)
LDL Cholesterol: 67 mg/dL (ref 0–99)
NonHDL: 92.59
Total CHOL/HDL Ratio: 3
Triglycerides: 127 mg/dL (ref 0.0–149.0)
VLDL: 25.4 mg/dL (ref 0.0–40.0)

## 2024-01-22 LAB — TSH: TSH: 0.15 u[IU]/mL — ABNORMAL LOW (ref 0.35–5.50)

## 2024-01-22 LAB — HEMOGLOBIN A1C: Hgb A1c MFr Bld: 6.1 % (ref 4.6–6.5)

## 2024-01-22 NOTE — Assessment & Plan Note (Signed)
 Blood pressure is at goal for age and co-morbidities.   Recommendations: continue metoprolol  25 mg BID - BP goal <130/80 - monitor and log blood pressures at home - check around the same time each day in a relaxed setting - Limit salt to <2000 mg/day - Follow DASH eating plan (heart healthy diet) - limit alcohol to 2 standard drinks per day for men and 1 per day for women - avoid tobacco products - get at least 2 hours of regular aerobic exercise weekly Patient aware of signs/symptoms requiring further/urgent evaluation. Labs updated today. Routine cardiology follow-up due next month.

## 2024-01-22 NOTE — Assessment & Plan Note (Signed)
 Hyperlipidemia managed with atorvastatin . Cholesterol levels well-managed. - Continue atorvastatin  10 mg daily.

## 2024-01-22 NOTE — Assessment & Plan Note (Signed)
 Indication for controlled substance: chronic pain Medication and dose: tramadol  25 mg daily PRN # pills per month: 15  Indication for controlled substance: anxiety Medication and dose: Ativan  1 mg TID PRN # pills per month: 90  Last UDS date: ordered today  Controlled Substance Treatment Agreement signed (Y/N): yes, today Controlled Substance Treatment Agreement last reviewed with patient:  01/22/24 NCCSRS/PDMP reviewed this encounter: Yes

## 2024-01-22 NOTE — Progress Notes (Signed)
 New Patient Office Visit  Subjective    Patient ID: Julie Park, female    DOB: [birth year]  Age: 89 y.o. MRN: [id number]  CC:  Chief Complaint  Patient presents with   Establish Care    HPI Julie Park presents to establish care. She lives with her daughter, Julie Park.     Discussed the use of AI scribe software for clinical note transcription with the patient, who gave verbal consent to proceed.  History of Present Illness Julie Park Julie Park is an 89 year old female who presents for a change in primary care provider due to dissatisfaction with previous care. She is accompanied by her daughter, Julie Park, who is her primary caregiver.  She has a history of anxiety and has been taking lorazepam  1 mg three times a day since 1982 without dose escalation. She occasionally experiences dizziness and shortness of breath, often attributing these symptoms to anxiety, especially in stimulating environments (young great-grandchildren playing rough).  She has chronic back pain managed with tramadol  25 mg once daily, initiated in 2020 following a back injury. She has tried weaning down, and this is the lowest she has been able to get to still manage her pain. She goes to PT for low back pain, hip pain, and generalized weakness.    She has hyperlipidemia managed with atorvastatin  10 mg daily and receives Prolia  injections for osteoporosis, with the next dose due in September. She takes Synthroid  100 mcg daily for hypothyroidism.  She occasionally uses nitroglycerin  for episodes of shortness of breath and jaw discomfort, describing these episodes as an inability to take a full breath, sometimes with a 'funny feeling' in her left jaw. These episodes are infrequent, and she does not use it often. Denies any chest pain or palpitations. She is due for regular cardiology follow-up next month.   Her past medical history includes diverticulosis, vertigo, and macular degeneration. She has undergone  cataract surgery, foot surgeries, tonsillectomy, and tubal ligation. She also has gastroesophageal reflux disease managed with daily omeprazole .  She enjoys a quiet environment and prefers solitude during the day while her daughter and son-in-law are at work.     01/22/2024   11:22 AM 08/22/2023    4:15 PM 09/27/2022   11:13 AM  PHQ9 SCORE ONLY  PHQ-9 Total Score 9 0 0      01/22/2024   11:22 AM  GAD 7 : Generalized Anxiety Score  Nervous, Anxious, on Edge 1  Control/stop worrying 1  Worry too much - different things 1  Trouble relaxing 1  Restless 1  Easily annoyed or irritable 0  Afraid - awful might happen 1  Total GAD 7 Score 6  Anxiety Difficulty Not difficult at all           Outpatient Encounter Medications as of 01/22/2024  Medication Sig   acetaminophen  (TYLENOL ) 325 MG tablet Take 2 tablets (650 mg total) by mouth every 6 (six) hours as needed.   aspirin  81 MG tablet Take 81 mg by mouth every morning.    atorvastatin  (LIPITOR) 10 MG tablet TAKE 1 TABLET DAILY   Fiber POWD Take by mouth.   fluticasone  (FLONASE ) 50 MCG/ACT nasal spray SPRAY 2 SPRAYS INTO EACH NOSTRIL EVERY DAY   gabapentin  (NEURONTIN ) 300 MG capsule Take 2 capsules (600 mg total) by mouth 3 (three) times daily.   levothyroxine  (SYNTHROID ) 100 MCG tablet TAKE 1 TABLET BY MOUTH EVERY DAY   loratadine  (CLARITIN ) 10 MG tablet TAKE 1 TABLET  BY MOUTH EVERY DAY   LORazepam  (ATIVAN ) 1 MG tablet TAKE 1 TABLET (1 MG TOTAL) BY MOUTH EVERY 8 (EIGHT) HOURS AS NEEDED. FOR ANXIETY   Magnesium  250 MG TABS Take 2 tablets by mouth daily.    metoprolol  tartrate (LOPRESSOR ) 25 MG tablet TAKE 1 TABLET BY MOUTH TWICE A DAY   mupirocin  ointment (BACTROBAN ) 2 % Apply 1 Application topically 2 (two) times daily.   nitroGLYCERIN  (NITROSTAT ) 0.4 MG SL tablet PLACE 1 TABLET (0.4 MG TOTAL) UNDER THE TONGUE EVERY 5 (FIVE) MINUTES X 3 DOSES AS NEEDED FOR CHEST PAIN (IF NO RELIEF AFTER 3RD DOSE, PROCEED TO ED OR CALL 911).    nystatin  cream (MYCOSTATIN ) APPLY 1 APPLICATION TOPICALLY AS NEEDED FOR DRY SKIN.   omeprazole  (PRILOSEC) 40 MG capsule TAKE 1 CAPSULE EVERY DAY   ondansetron  (ZOFRAN ) 4 MG tablet Take 1 tablet (4 mg total) by mouth every 8 (eight) hours as needed for nausea or vomiting.   Propylene Glycol (SYSTANE BALANCE) 0.6 % SOLN Apply to eye daily as needed.   sodium chloride  (OCEAN) 0.65 % nasal spray Place 1 spray into the nose as needed for congestion.   traMADol  (ULTRAM ) 50 MG tablet TAKE 1/2 TABLET BY MOUTH DAILY   [DISCONTINUED] sodium chloride  1 g tablet Take 1 tablet (1 g total) by mouth 2 (two) times daily with a meal.   Facility-Administered Encounter Medications as of 01/22/2024  Medication   [START ON 02/18/2024] denosumab  (PROLIA ) injection 60 mg    Past Medical History:  Diagnosis Date   Anxiety    Chest pain 02/08/15   ETT normal   Diverticulosis    Diverticulitis (1982)   Fracture of rib of right side 08/16/2013   GERD (gastroesophageal reflux disease)    Hyperlipidemia, mixed    Hypothyroidism    IBS (irritable bowel syndrome)    Morton's neuroma    Right lumbar radiculopathy    Intermittent x 2 episodes in summer 2014   Shingles 1967 and 2010   Ulcer    Urine incontinence    Vertigo     Past Surgical History:  Procedure Laterality Date   CARDIOVASCULAR STRESS TEST  02/08/15   ETT normal   CATARACT EXTRACTION  2012   Dr. Cleatus   COLONOSCOPY  2005   Dr. Myriam at [facility] (normal per pt report)   DEXA  11/03/13   Heel T score -0.8.   FOOT NEUROMA SURGERY  1994   right foot   TONSILLECTOMY     TONSILLECTOMY AND ADENOIDECTOMY  1948   Dr. Neomia Benne [facility]   TUBAL LIGATION  1974   WISDOM TOOTH EXTRACTION      Family History  Problem Relation Age of Onset   Tuberculosis Mother    Pancreatic cancer Father    Asthma Daughter    Heart attack Paternal Grandfather     Social History   Socioeconomic History   Marital status: Single    Spouse name: Not on  file   Number of children: Not on file   Years of education: Not on file   Highest education level: Not on file  Occupational History   Occupation: Retired  Tobacco Use   Smoking status: Former    Current packs/day: 1.00    Average packs/day: 1 pack/day for 40.6 years (40.6 ttl pk-yrs)    Types: Cigarettes    Start date: 06/26/1983   Smokeless tobacco: Never  Vaping Use   Vaping status: Never Used  Substance and Sexual  Activity   Alcohol use: No   Drug use: No   Sexual activity: Not Currently  Other Topics Concern   Not on file  Social History Narrative   Divorced, 2 children.   Lives in [place].   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   Tobacco: small amount, distant past.  Alcohol: none.           Social History      Diet? n/a      Do you drink/eat things with caffeine? coffee      Marital status?     Divorced (1984)                              What year were you married? 1958      Do you live in a house, apartment, assisted living, condo, trailer, etc.? house      Is it one or more stories?  yes      How many persons live in your home? Living with daughter and husband at the present      Do you have any pets in your home? (please list) no       Highest level of education completed? 16 years      Current or past profession: teacher grades 1 and 2, education coordinator [employer]       Do you exercise?                   yes                   Type & how often? Walk 1-3 times per week      Advanced Directives      Do you have a living will? yes      Do you have a DNR form?                                  If not, do you want to discuss one?      Do you have signed POA/HPOA for forms? yes      Functional Status      Do you have difficulty bathing or dressing yourself?      Do you have difficulty preparing food or eating?       Do you have difficulty managing your medications?       Do you have difficulty managing your finances?      Do you have difficulty affording your medications?      Social Drivers of Corporate investment banker Strain: Low Risk  (09/27/2022)   Overall Financial Resource Strain (CARDIA)    Difficulty of Paying Living Expenses: Not hard at all  Food Insecurity: No Food Insecurity (09/27/2022)   Hunger Vital Sign    Worried About Running Out of Food in the Last Year: Never true    Ran Out of Food in the Last Year: Never true  Transportation Needs: No Transportation Needs (09/27/2022)   PRAPARE - Administrator, Civil Service (Medical): No    Lack of Transportation (Non-Medical): No  Physical Activity: Inactive (09/27/2022)   Exercise Vital Sign    Days of Exercise per Week: 0 days    Minutes of Exercise per Session: 0 min  Stress: No Stress Concern Present (09/27/2022)  [employer] of Occupational Health - Occupational Stress Questionnaire    Feeling of Stress : Only a little  Social Connections: Socially Isolated (09/27/2022)   Social Connection and Isolation Panel    Frequency of Communication with Friends and Family: Three times a week    Frequency of Social Gatherings with Friends and Family: More than three times a week    Attends Religious Services: Never    Database administrator or Organizations: Not on file    Attends Banker Meetings: Never    Marital Status: Divorced  Catering manager Violence: Not At Risk (09/27/2022)   Humiliation, Afraid, Rape, and Kick questionnaire    Fear of Current or Ex-Partner: No    Emotionally Abused: No    Physically Abused: No    Sexually Abused: No    ROS All review of systems negative except what is listed in the HPI      Objective    BP (!) 119/49   Pulse 60   Ht 5' 4 (1.626 m)   Wt 173 lb (78.5 kg)   SpO2 96%   BMI 29.70 kg/m   Physical Exam Vitals reviewed.  Constitutional:      General: She is not in acute distress.    Appearance: Normal  appearance. She is not ill-appearing.  Cardiovascular:     Rate and Rhythm: Normal rate and regular rhythm.     Heart sounds: Normal heart sounds.  Pulmonary:     Effort: Pulmonary effort is normal.     Breath sounds: Normal breath sounds.  Skin:    General: Skin is warm and dry.  Neurological:     Mental Status: She is alert and oriented to person, place, and time.  Psychiatric:        Mood and Affect: Mood normal.        Behavior: Behavior normal.        Thought Content: Thought content normal.        Judgment: Judgment normal.           Assessment & Plan:   Problem List Items Addressed This Visit       Active Problems   HLD (hyperlipidemia) (Chronic)   Hyperlipidemia managed with atorvastatin . Cholesterol levels well-managed. - Continue atorvastatin  10 mg daily.      Relevant Orders   Lipid panel   GERD (gastroesophageal reflux disease) (Chronic)   GERD managed with omeprazole . - Continue omeprazole  daily.      Hypothyroidism (Chronic)   Hypothyroidism managed with Synthroid . - Continue Synthroid  100 mcg daily.      Relevant Orders   TSH   GAD (generalized anxiety disorder) (Chronic)   Long-standing anxiety managed with lorazepam .  - Continue lorazepam  1 mg three times daily. - Discuss safety precautions due to benzodiazepine use given her age. States she has tried weaning before and it did not go well. She is stable on current regimen and is aware of safety concerns.      HTN (hypertension) (Chronic)   Blood pressure is at goal for age and co-morbidities.   Recommendations: continue metoprolol  25 mg BID - BP goal <130/80 - monitor and log blood pressures at home - check around the same time each day in a relaxed setting - Limit salt to <2000 mg/day - Follow DASH eating plan (heart healthy diet) - limit alcohol to 2 standard drinks per day for men and 1 per day for women - avoid tobacco products - get at least 2 hours  of regular aerobic exercise  weekly Patient aware of signs/symptoms requiring further/urgent evaluation. Labs updated today. Routine cardiology follow-up due next month.        Relevant Orders   CBC with Differential/Platelet   Comprehensive metabolic panel with GFR   Chronic low back pain (Chronic)   Chronic back pain managed with tramadol . Discussed potential to wean off but decided to maintain current regimen due to effectiveness. - Continue tramadol  25 mg daily. - Discuss safety precautions due to high-risk medication. - Monitor for pain levels and consider weaning if feasible.      Hyponatremia   Labs today       High risk medication use   Indication for controlled substance: chronic pain Medication and dose: tramadol  25 mg daily PRN # pills per month: 15  Indication for controlled substance: anxiety Medication and dose: Ativan  1 mg TID PRN # pills per month: 90  Last UDS date: ordered today  Controlled Substance Treatment Agreement signed (Y/N): yes, today Controlled Substance Treatment Agreement last reviewed with patient:  01/22/24 NCCSRS/PDMP reviewed this encounter: Yes        Relevant Orders   DRUG MONITORING, PANEL 8 WITH CONFIRMATION, URINE   Other Visit Diagnoses       Encounter for medical examination to establish care    -  Primary     Encounter for screening mammogram for malignant neoplasm of breast       Relevant Orders   MM DIGITAL SCREENING BILATERAL     Screening for diabetes mellitus       Relevant Orders   Hemoglobin A1c         Return in about 3 months (around 04/23/2024) for chronic disease management.   Julie KATHEE Mon, NP

## 2024-01-22 NOTE — Assessment & Plan Note (Signed)
 GERD managed with omeprazole . - Continue omeprazole  daily.

## 2024-01-22 NOTE — Assessment & Plan Note (Signed)
 Labs today

## 2024-01-22 NOTE — Assessment & Plan Note (Signed)
 Hypothyroidism managed with Synthroid . - Continue Synthroid  100 mcg daily.

## 2024-01-22 NOTE — Patient Instructions (Signed)

## 2024-01-22 NOTE — Assessment & Plan Note (Signed)
 Chronic back pain managed with tramadol . Discussed potential to wean off but decided to maintain current regimen due to effectiveness. - Continue tramadol  25 mg daily. - Discuss safety precautions due to high-risk medication. - Monitor for pain levels and consider weaning if feasible.

## 2024-01-22 NOTE — Assessment & Plan Note (Signed)
 Long-standing anxiety managed with lorazepam .  - Continue lorazepam  1 mg three times daily. - Discuss safety precautions due to benzodiazepine use given her age. States she has tried weaning before and it did not go well. She is stable on current regimen and is aware of safety concerns.

## 2024-01-23 ENCOUNTER — Other Ambulatory Visit: Payer: Self-pay | Admitting: Family

## 2024-01-23 ENCOUNTER — Encounter (HOSPITAL_BASED_OUTPATIENT_CLINIC_OR_DEPARTMENT_OTHER)

## 2024-01-23 ENCOUNTER — Ambulatory Visit: Payer: Self-pay | Admitting: Family

## 2024-01-23 MED ORDER — LEVOTHYROXINE SODIUM 88 MCG PO TABS: 88.0000 ug | ORAL_TABLET | Freq: Every day | ORAL | 1 refills | Status: DC

## 2024-01-25 LAB — DRUG MONITORING, PANEL 8 WITH CONFIRMATION, URINE
6 Acetylmorphine: NEGATIVE ng/mL (ref <10)
Alcohol Metabolites: NEGATIVE ng/mL (ref <500)
Alphahydroxyalprazolam: NEGATIVE ng/mL (ref <25)
Alphahydroxymidazolam: NEGATIVE ng/mL (ref <50)
Alphahydroxytriazolam: NEGATIVE ng/mL (ref <50)
Aminoclonazepam: NEGATIVE ng/mL (ref <25)
Amphetamines: NEGATIVE ng/mL (ref <500)
Benzodiazepines: POSITIVE ng/mL — AB (ref <100)
Buprenorphine, Urine: NEGATIVE ng/mL (ref <5)
Cocaine Metabolite: NEGATIVE ng/mL (ref <150)
Creatinine: 103.1 mg/dL (ref >=20.0)
Hydroxyethylflurazepam: NEGATIVE ng/mL (ref <50)
Lorazepam: 3728 ng/mL — ABNORMAL HIGH (ref <50)
MDMA: NEGATIVE ng/mL (ref <500)
Marijuana Metabolite: NEGATIVE ng/mL (ref <20)
Nordiazepam: NEGATIVE ng/mL (ref <50)
Opiates: NEGATIVE ng/mL (ref <100)
Oxazepam: NEGATIVE ng/mL (ref <50)
Oxidant: NEGATIVE ug/mL (ref <200)
Oxycodone: NEGATIVE ng/mL (ref <100)
Temazepam: NEGATIVE ng/mL (ref <50)
pH: 6.4 (ref 4.5–9.0)

## 2024-01-25 LAB — DM TEMPLATE

## 2024-01-27 ENCOUNTER — Other Ambulatory Visit: Payer: Self-pay | Admitting: Family Medicine

## 2024-01-27 ENCOUNTER — Other Ambulatory Visit: Payer: Self-pay | Admitting: Orthopedic Surgery

## 2024-01-27 DIAGNOSIS — F419 Anxiety disorder, unspecified: Secondary | ICD-10-CM

## 2024-01-27 NOTE — Telephone Encounter (Signed)
 duplicate

## 2024-01-27 NOTE — Telephone Encounter (Signed)
 Transferred care to Julie Park and she did sign contract at appt recently.

## 2024-01-27 NOTE — Telephone Encounter (Signed)
 Copied from CRM [id number]. Topic: Clinical - Medication Refill >> Jan 27, 2024 12:39 PM Drema MATSU wrote: Medication: LORazepam  (ATIVAN ) 1 MG tablet  Has the patient contacted their pharmacy? Yes (Agent: If no, request that the patient contact the pharmacy for the refill. If patient does not wish to contact the pharmacy document the reason why and proceed with request.) contact pcp  (Agent: If yes, when and what did the pharmacy advise?)  This is the patient's preferred pharmacy:  [id number] - [place], [place] - [address] [place] [address] Phone: [phone] Fax: [phone]  Is this the correct pharmacy for this prescription? Yes If no, delete pharmacy and type the correct one.   Has the prescription been filled recently? Yes  Is the patient out of the medication? Yes  Has the patient been seen for an appointment in the last year OR does the patient have an upcoming appointment? Yes  Can we respond through MyChart? Yes  Agent: Please be advised that Rx refills may take up to 3 business days. We ask that you follow-up with your pharmacy.

## 2024-01-28 ENCOUNTER — Ambulatory Visit (HOSPITAL_BASED_OUTPATIENT_CLINIC_OR_DEPARTMENT_OTHER): Attending: Orthopedic Surgery | Admitting: Physical Therapy

## 2024-01-28 ENCOUNTER — Encounter (HOSPITAL_BASED_OUTPATIENT_CLINIC_OR_DEPARTMENT_OTHER): Payer: Self-pay | Admitting: Physical Therapy

## 2024-01-28 DIAGNOSIS — R2681 Unsteadiness on feet: Secondary | ICD-10-CM | POA: Insufficient documentation

## 2024-01-28 DIAGNOSIS — M5459 Other low back pain: Secondary | ICD-10-CM | POA: Insufficient documentation

## 2024-01-28 DIAGNOSIS — M6281 Muscle weakness (generalized): Secondary | ICD-10-CM | POA: Insufficient documentation

## 2024-01-28 NOTE — Therapy (Signed)
 OUTPATIENT PHYSICAL THERAPY THORACOLUMBAR TREATMENT    Patient Name: Julie Park MRN: [id number] DOB:[birth year], 89 y.o., female Today's Date: 01/28/2024  END OF SESSION:  PT End of Session - 01/28/24 1203     Visit Number 11    Number of Visits 16    Date for PT Re-Evaluation 02/07/24    Authorization Type hulan [employer]    PT Start Time 1145    PT Stop Time 1225    PT Time Calculation (min) 40 min    Activity Tolerance Patient tolerated treatment well    Behavior During Therapy [facility] for tasks assessed/performed             Past Medical History:  Diagnosis Date   Anxiety    Chest pain 02/08/15   ETT normal   Diverticulosis    Diverticulitis (1982)   Fracture of rib of right side 08/16/2013   GERD (gastroesophageal reflux disease)    Hyperlipidemia, mixed    Hypothyroidism    IBS (irritable bowel syndrome)    Morton's neuroma    Right lumbar radiculopathy    Intermittent x 2 episodes in summer 2014   Shingles 1967 and 2010   Ulcer    Urine incontinence    Vertigo    Past Surgical History:  Procedure Laterality Date   CARDIOVASCULAR STRESS TEST  02/08/15   ETT normal   CATARACT EXTRACTION  2012   Dr. Cleatus   COLONOSCOPY  2005   Dr. Myriam at [facility] (normal per pt report)   DEXA  11/03/13   Heel T score -0.8.   FOOT NEUROMA SURGERY  1994   right foot   TONSILLECTOMY     TONSILLECTOMY AND ADENOIDECTOMY  1948   Dr. Neomia [facility]ne [facility]   TUBAL LIGATION  [phone]   WISDOM TOOTH EXTRACTION     Patient Active Problem List   Diagnosis Date Noted   Chronic low back pain 01/22/2024   High risk medication use 01/22/2024   Gastritis 12/23/2023   HTN (hypertension) 12/23/2023   Right hip pain 03/12/2023   Cardiac chest pain 08/20/2022   Lower extremity pain 08/20/2022   Gait abnormality 04/25/2021   Nevus of choroid of right eye 08/24/2019   Posterior vitreous detachment of right eye 08/24/2019   Pseudophakia of both eyes 08/24/2019   GAD (generalized  anxiety disorder) 07/11/2018   Overactive bladder 07/11/2018   Functional urinary incontinence 07/10/2018   Hyponatremia 03/31/2014   HLD (hyperlipidemia)    Diverticular disease    Anxiety state    GERD (gastroesophageal reflux disease)    Hypothyroidism    Urine incontinence    IRRITABLE BOWEL SYNDROME 03/02/2010    PCP: Gil Greig BRAVO, NP   REFERRING PROVIDER: Gil Greig BRAVO, NP   REFERRING DIAG: M54.41,M54.42,G89.29 (ICD-10-CM) - Chronic midline low back pain with bilateral sciatica   Rationale for Evaluation and Treatment: Rehabilitation  THERAPY DIAG:  Other low back pain  Unsteadiness on feet  Muscle weakness (generalized)  ONSET DATE: 2020  SUBJECTIVE:  SUBJECTIVE STATEMENT: Increased right knee pain for past few days 6/10.  :LBP better maybe 80 % better than prior to PT.  POOL ACCESS: neighbor has pool.  She may consider joining [employer]  Initial Subjective Picked up a case of waters in 2020 and had compression fx  (T10 or 12) which has healed but pain has been consistent since then.  I also have oa right hip which hurts.  I am afraid of falling.  I don't move around much in my home.  Use a cane when I lave the house.  As long as I can have 2 fingers on the wall or sofa I can get around my house pretty good.  PERTINENT HISTORY:  Thoracic compression fx 2020 Oa hip right  PAIN:  Are you having pain? no: NPRS scale: 0/10 LB Pain location: mid to lower back Pain description: a Aggravating factors: walking 10 minutes Relieving factors: resting lying down  PRECAUTIONS: Fall  RED FLAGS: None   WEIGHT BEARING RESTRICTIONS: No  FALLS:  Has patient fallen in last 6 months? No  LIVING ENVIRONMENT: Lives with: lives with their family Lives in: House/apartment Stairs:  Yes: Internal: 12-14 steps; on right going up and on left going up and External: 0 steps; none Has following equipment at home: Quad cane large base, Environmental consultant - 4 wheeled, Marine scientist  OCCUPATION: retired  PLOF: Insurance account manager touch with walking through home    PATIENT GOALS: be able to walk for a period of time with little to  no pain in my back. Dtr wants mother to improve gait.  NEXT MD VISIT: as needed  OBJECTIVE:  Note: Objective measures were completed at Evaluation unless otherwise noted.  DIAGNOSTIC FINDINGS:  03/11/24 IMPRESSION: Moderate degenerative joint disease of right hip. No acute abnormality seen.  PATIENT SURVEYS:  Modified Oswestry 23/50=46%   COGNITION: Overall cognitive status: Within functional limits for tasks assessed     SENSATION: WFL  MUSCLE LENGTH: Hamstrings: mild restriction on B Gastrocnemius: moderate restriction on B   POSTURE: rounded shoulders, forward head, and flexed trunk (standing).  Left pelvic obliquity   LUMBAR ROM:   AROM eval  Flexion full  Extension 50% limited P!  Right lateral flexion P!  Left lateral flexion 50% limited P!  Right rotation   Left rotation    (Blank rows = not tested)    LOWER EXTREMITY MMT:    MMT Right eval Left eval R / L 01/21/24  Hip flexion 3+ 3+ 4 / 4  Hip extension     Hip abduction 4+ 4+ 5- / 5-  Hip adduction 5 5   Hip internal rotation     Hip external rotation     Knee flexion 3+ 3+ 4 / 4  Knee extension 3+ 3+ 4 / 4  Ankle dorsiflexion     Ankle plantarflexion     Ankle inversion     Ankle eversion      (Blank rows = not tested)    FUNCTIONAL TESTS:  30 seconds chair stand test: 3 from pool bench with ue support Timed up and go (TUG): >30s BERG Balance Test          Date: eval  Sit to Stand 3  Standing unsupported 4  Sitting with back unsupported but feet supported 4  Stand to sit  2  Transfers  3  Standing unsupported with eyes closed 3  Standing  unsupported feet together 3  From standing position, reach forward  with outstretched arm 3  From standing position, pick up object from floor 3  From standing position, turn and look behind over each shoulder 1  Turn 360 2  Standing unsupported, alternately place foot on step 0  Standing unsupported, one foot in front 0  Standing on one leg 0  Total:  31/56   7/29 3. able to stand independently using hands  4. able to stand safely for 2 minutes  4. able to sit safely and securely for 2 minutes   4. sits safely with minimal use of hands  3. able to transfer safely with definite need of hands  4. able to stand 10 seconds safely  3. able to place feet together independently and stand 1 minute with supervision  3. can reach forward 12 cm (5 inches)    3. able to pick up slipper but needs supervision  2. turns sideways but only maintains balance    2. able to turn 360 degrees safely but slowly  2. able to complete 4 steps without aid with supervision    1. needs help to step but can hold 15 seconds   2. able to lift leg independently and hold >= 3 seconds   40/56     GAIT: Distance walked: 400 Assistive device utilized: Personal assistant bench Level of assistance: Modified independence Comments: guarded, reduced step length and cadence, decreased heel strike and toe off  TREATMENT  DATE:01/28/24 Pt seen for aquatic therapy today.  Treatment took place in water 3.5-4.75 ft in depth at the [facility] pool. Temp of water was 91.  Pt entered/exited the pool via stairs using step to and alternating pattern with hand rail.  *UE on barbell: walking forward, backward and side stepping, multiple widths. *forward march with kick ue support barbell *standing ue support wall 3.7 ft: toe raises; heel raises; HS curls; relaxed squats; high knee marching; hip add/abd x 12 reps *seated water bench: cycling; hip add/abd; LAQ *STS onto water step with cga-indep 2 x  5 *forward stepping down off of water step unsupported then up x 3 *step ups bottom step leading R/L 2 x 5 unsupported  *stair tapping 1st step then second x8 unsupported. (Good challenge 2nd step)  Pt requires the buoyancy and hydrostatic pressure of water for support, and to offload joints by unweighting joint load by at least 50 % in navel deep water and by at least 75-80% in chest to neck deep water.  Viscosity of the water is needed for resistance of strengthening. Water current perturbations provides challenge to standing balance requiring increased core activation.         [facility] Adult PT Treatment:                                                 01/16/2024 - LTR - PPT  - Bridges  - supine ball squeezes  - supine clams with RTB  - TrA acitation 5 sec holds.  - TrA activation with RTB supine marches - Sit to stand with 5lb kettle bell - LAQ      PATIENT EDUCATION:  Education details: intro to aquatic therapy  Person educated: Patient Education method: Explanation  Education comprehension: verbalized understanding  HOME EXERCISE PROGRAM: Walking daily   ASSESSMENT:  CLINICAL IMPRESSION: Pt reports being both physically and mentally better since  onset of PT. Some increase in right knee pain today which is reduced with aquatic intervention today by end of session.  Increased balance challenges with stepping onto the off of single step in 3.6 ft unsupported.  She demonstrates improving immediate standing balance with STS transfers submerged progressing from cga to indep as reps progress.  Plan to re-assess next aquatic session with anticipated extension of therapy with alternating land and aquatics as she continue to demonstrated improvement in all areas of balance, strength and pain lowering her fall risk.   PN: Pt reports good response to 1st land session.  No pain or excessive fatigue. She demonstrates improvements today in [place] increasing by 9 points reducing her fall  risk.  Strength improved by at least 1/2 grade in hips.  Dtr reports pt is more mobile at home.  She has not yet begun walking a few times a week to and from mailbox due to the extreme hot weather. She is progressed now transitioning from aquatics to land increasing load for increased strengthening with good toleration.  Multiple goals met.  She will continue to benefit from skilled PT in both aquatic na dland settings to address continued deficits.     Initial Impression Patient is a 89 y.o. f who was seen today for physical therapy evaluation and treatment for LBP. She sustained a compression fx 5 years ago which continues to cause pain. As per pt, she is afraid of falling and because of that has been very sedentary. Has not had a fall in past year.  Main goals are to improve gait, toleration to amb and improve strength for overall improved function/mobility and health.  Exam indicates decreased strength throughout LE and core, balance and proprioceptive deficits with increased risk of falls, Left pelvic obliquity and gait deviation.  She will benefit from skilled physical therapy to improve all areas of deficits as well as QOL.  OBJECTIVE IMPAIRMENTS: Abnormal gait, decreased activity tolerance, decreased balance, decreased coordination, decreased knowledge of use of DME, decreased mobility, difficulty walking, decreased strength, postural dysfunction, and pain.   ACTIVITY LIMITATIONS: carrying, lifting, bending, standing, squatting, stairs, transfers, and locomotion level  PARTICIPATION LIMITATIONS: meal prep, cleaning, shopping, and community activity  PERSONAL FACTORS: Age, Fitness, Past/current experiences, and Time since onset of injury/illness/exacerbation are also affecting patient's functional outcome.   REHAB POTENTIAL: Good  CLINICAL DECISION MAKING: Evolving/moderate complexity  EVALUATION COMPLEXITY: Moderate   GOALS: Goals reviewed with patient? Yes  SHORT TERM GOALS:  Target date: 12/08/23  Pt will tolerate full aquatic sessions consistently without increase in pain and with improving function to demonstrate good toleration and effectiveness of intervention.  Baseline: Goal status:MET -12/10/23  2.  Pt will tolerate stair climbing using combination of an alternating and step to pattern ascending and descending 6 steps without use of handrail Baseline:  Goal status: Met 12/31/23  3.  Pt will report reduction of pain submerged to 0/10 Baseline:  Goal status: Met 12/31/23  4.  Pt will begin amb routinely 2-3 x weekly with daughter for exercise as HEP. Baseline:  Goal status: In progress 12/31/23; 01/21/24   LONG TERM GOALS: Target date: 02/07/24  Pt to improve on ODI by 13-15 (MCID)  % to demonstrate statistically significant Improvement in function. Baseline:Modified Oswestry 23/50=46%  Goal status: INITIAL   2. Pt will amb x1000 ft without limitation to pain using ad Baseline:  Goal status: In progress 01/21/24  3.  Pt will improve on Berg balance test to >/= 40/56 to  demonstrate a decrease in fall risk. Baseline: 31/56 Goal status: Met 01/21/24  4.  Pt will improve strength in all areas listed by 1 grade to demonstrate improved overall physical function Baseline:  Goal status: In progress 01/21/24  5.  Pt will demonstrate improved gait pattern with more normal step length with heel strike and toe off each step Baseline:  Goal status: INITIAL  6.  Pt will improve on Tug test to <or= 21s (avg for older adults) to demonstrate improvement in lower extremity function, mobility and decreased fall risk. Baseline:  Goal status: INITIAL  PLAN:  PT FREQUENCY: 2x/week  PT DURATION: 8 weeks  PLANNED INTERVENTIONS: 97164- PT Re-evaluation, 97110-Therapeutic exercises, 97530- Therapeutic activity, 97112- Neuromuscular re-education, 97535- Self Care, [id number]- Manual therapy, [phone]- Gait training, [phone]- Aquatic Therapy, [phone]- Electrical stimulation  (unattended), [phone]- Ionotophoresis 4mg /ml Dexamethasone, Patient/Family education, Balance training, Stair training, Taping, Dry Needling, Joint mobilization, DME instructions, Cryotherapy, and Moist heat.  PLAN FOR NEXT SESSION: general core and LE strengthening, balance retraining, gait, AD instruction, pain managemen.  Begin aquatics only, transition land after a few weeks  Ronal Kem) Lonetta Blassingame MPT 01/28/24 12:43 PM [facility] [address] [place], [place], [address] Phone: [phone]   Fax:  [phone]

## 2024-01-30 ENCOUNTER — Ambulatory Visit (HOSPITAL_BASED_OUTPATIENT_CLINIC_OR_DEPARTMENT_OTHER)

## 2024-01-30 ENCOUNTER — Encounter (HOSPITAL_BASED_OUTPATIENT_CLINIC_OR_DEPARTMENT_OTHER): Payer: Self-pay

## 2024-01-30 DIAGNOSIS — R2681 Unsteadiness on feet: Secondary | ICD-10-CM

## 2024-01-30 DIAGNOSIS — M6281 Muscle weakness (generalized): Secondary | ICD-10-CM

## 2024-01-30 DIAGNOSIS — M5459 Other low back pain: Secondary | ICD-10-CM

## 2024-01-30 NOTE — Therapy (Signed)
 OUTPATIENT PHYSICAL THERAPY THORACOLUMBAR TREATMENT    Patient Name: Julie Park MRN: [id number] DOB:[birth year], 89 y.o., female Today's Date: 01/30/2024  END OF SESSION:  PT End of Session - 01/30/24 1156     Visit Number 12    Number of Visits 16    Date for PT Re-Evaluation 02/07/24    Authorization Type hulan [employer]    PT Start Time 1152    PT Stop Time 1230    PT Time Calculation (min) 38 min    Activity Tolerance Patient tolerated treatment well    Behavior During Therapy [facility] for tasks assessed/performed              Past Medical History:  Diagnosis Date   Anxiety    Chest pain 02/08/15   ETT normal   Diverticulosis    Diverticulitis (1982)   Fracture of rib of right side 08/16/2013   GERD (gastroesophageal reflux disease)    Hyperlipidemia, mixed    Hypothyroidism    IBS (irritable bowel syndrome)    Morton's neuroma    Right lumbar radiculopathy    Intermittent x 2 episodes in summer 2014   Shingles 1967 and 2010   Ulcer    Urine incontinence    Vertigo    Past Surgical History:  Procedure Laterality Date   CARDIOVASCULAR STRESS TEST  02/08/15   ETT normal   CATARACT EXTRACTION  2012   Dr. Cleatus   COLONOSCOPY  2005   Dr. Myriam at [facility] (normal per pt report)   DEXA  11/03/13   Heel T score -0.8.   FOOT NEUROMA SURGERY  1994   right foot   TONSILLECTOMY     TONSILLECTOMY AND ADENOIDECTOMY  1948   Dr. Neomia [facility]   TUBAL LIGATION  [phone]   WISDOM TOOTH EXTRACTION     Patient Active Problem List   Diagnosis Date Noted   Chronic low back pain 01/22/2024   High risk medication use 01/22/2024   Gastritis 12/23/2023   HTN (hypertension) 12/23/2023   Right hip pain 03/12/2023   Cardiac chest pain 08/20/2022   Lower extremity pain 08/20/2022   Gait abnormality 04/25/2021   Nevus of choroid of right eye 08/24/2019   Posterior vitreous detachment of right eye 08/24/2019   Pseudophakia of both eyes 08/24/2019   GAD (generalized  anxiety disorder) 07/11/2018   Overactive bladder 07/11/2018   Functional urinary incontinence 07/10/2018   Hyponatremia 03/31/2014   HLD (hyperlipidemia)    Diverticular disease    Anxiety state    GERD (gastroesophageal reflux disease)    Hypothyroidism    Urine incontinence    IRRITABLE BOWEL SYNDROME 03/02/2010    PCP: Gil Greig BRAVO, NP   REFERRING PROVIDER: Gil Greig BRAVO, NP   REFERRING DIAG: M54.41,M54.42,G89.29 (ICD-10-CM) - Chronic midline low back pain with bilateral sciatica   Rationale for Evaluation and Treatment: Rehabilitation  THERAPY DIAG:  Other low back pain  Unsteadiness on feet  Muscle weakness (generalized)  ONSET DATE: 2020  SUBJECTIVE:  SUBJECTIVE STATEMENT: Ongoing R knee pain and stiffness. States her back is fine. States standing >10-15 minutes bothers her the most.   POOL ACCESS: neighbor has pool.  She may consider joining [employer]  Initial Subjective Picked up a case of waters in 2020 and had compression fx  (T10 or 12) which has healed but pain has been consistent since then.  I also have oa right hip which hurts.  I am afraid of falling.  I don't move around much in my home.  Use a cane when I lave the house.  As long as I can have 2 fingers on the wall or sofa I can get around my house pretty good.  PERTINENT HISTORY:  Thoracic compression fx 2020 Oa hip right  PAIN:  Are you having pain? no: NPRS scale: 0/10 LB Pain location: mid to lower back Pain description: a Aggravating factors: walking 10 minutes Relieving factors: resting lying down  PRECAUTIONS: Fall  RED FLAGS: None   WEIGHT BEARING RESTRICTIONS: No  FALLS:  Has patient fallen in last 6 months? No  LIVING ENVIRONMENT: Lives with: lives with their family Lives in:  House/apartment Stairs: Yes: Internal: 12-14 steps; on right going up and on left going up and External: 0 steps; none Has following equipment at home: Quad cane large base, Environmental consultant - 4 wheeled, Marine scientist  OCCUPATION: retired  PLOF: Insurance account manager touch with walking through home    PATIENT GOALS: be able to walk for a period of time with little to  no pain in my back. Dtr wants mother to improve gait.  NEXT MD VISIT: as needed  OBJECTIVE:  Note: Objective measures were completed at Evaluation unless otherwise noted.  DIAGNOSTIC FINDINGS:  03/11/24 IMPRESSION: Moderate degenerative joint disease of right hip. No acute abnormality seen.  PATIENT SURVEYS:  Modified Oswestry 23/50=46%   COGNITION: Overall cognitive status: Within functional limits for tasks assessed     SENSATION: WFL  MUSCLE LENGTH: Hamstrings: mild restriction on B Gastrocnemius: moderate restriction on B   POSTURE: rounded shoulders, forward head, and flexed trunk (standing).  Left pelvic obliquity   LUMBAR ROM:   AROM eval  Flexion full  Extension 50% limited P!  Right lateral flexion P!  Left lateral flexion 50% limited P!  Right rotation   Left rotation    (Blank rows = not tested)    LOWER EXTREMITY MMT:    MMT Right eval Left eval R / L 01/21/24  Hip flexion 3+ 3+ 4 / 4  Hip extension     Hip abduction 4+ 4+ 5- / 5-  Hip adduction 5 5   Hip internal rotation     Hip external rotation     Knee flexion 3+ 3+ 4 / 4  Knee extension 3+ 3+ 4 / 4  Ankle dorsiflexion     Ankle plantarflexion     Ankle inversion     Ankle eversion      (Blank rows = not tested)    FUNCTIONAL TESTS:  30 seconds chair stand test: 3 from pool bench with ue support Timed up and go (TUG): >30s BERG Balance Test          Date: eval  Sit to Stand 3  Standing unsupported 4  Sitting with back unsupported but feet supported 4  Stand to sit  2  Transfers  3  Standing unsupported with eyes  closed 3  Standing unsupported feet together 3  From standing position, reach forward  with outstretched arm 3  From standing position, pick up object from floor 3  From standing position, turn and look behind over each shoulder 1  Turn 360 2  Standing unsupported, alternately place foot on step 0  Standing unsupported, one foot in front 0  Standing on one leg 0  Total:  31/56   7/29 3. able to stand independently using hands  4. able to stand safely for 2 minutes  4. able to sit safely and securely for 2 minutes   4. sits safely with minimal use of hands  3. able to transfer safely with definite need of hands  4. able to stand 10 seconds safely  3. able to place feet together independently and stand 1 minute with supervision  3. can reach forward 12 cm (5 inches)    3. able to pick up slipper but needs supervision  2. turns sideways but only maintains balance    2. able to turn 360 degrees safely but slowly  2. able to complete 4 steps without aid with supervision    1. needs help to step but can hold 15 seconds   2. able to lift leg independently and hold >= 3 seconds   40/56     GAIT: Distance walked: 400 Assistive device utilized: Personal assistant bench Level of assistance: Modified independence Comments: guarded, reduced step length and cadence, decreased heel strike and toe off  TREATMENT   [facility] Adult PT Treatment:                                                 01/30/2024 - LTR x10ea - PPT 5 x10 - Bridges 2x10 - supine ball squeezes 5 x86min - supine clams with RTB x20 - TrA activation with RTB supine marches -Supine SLR with TA  x10bil -supine HSS manual bil -Standing R gastroc stretch  DATE:01/28/24 Pt seen for aquatic therapy today.  Treatment took place in water 3.5-4.75 ft in depth at the [facility] pool. Temp of water was 91.  Pt entered/exited the pool via stairs using step to and alternating pattern with hand rail.  *UE  on barbell: walking forward, backward and side stepping, multiple widths. *forward march with kick ue support barbell *standing ue support wall 3.7 ft: toe raises; heel raises; HS curls; relaxed squats; high knee marching; hip add/abd x 12 reps *seated water bench: cycling; hip add/abd; LAQ *STS onto water step with cga-indep 2 x 5 *forward stepping down off of water step unsupported then up x 3 *step ups bottom step leading R/L 2 x 5 unsupported  *stair tapping 1st step then second x8 unsupported. (Good challenge 2nd step)  Pt requires the buoyancy and hydrostatic pressure of water for support, and to offload joints by unweighting joint load by at least 50 % in navel deep water and by at least 75-80% in chest to neck deep water.  Viscosity of the water is needed for resistance of strengthening. Water current perturbations provides challenge to standing balance requiring increased core activation.         [facility] Adult PT Treatment:  01/16/2024 - LTR - PPT  - Bridges  - supine ball squeezes  - supine clams with RTB  - TrA acitation 5 sec holds.  - TrA activation with RTB supine marches - Sit to stand with 5lb kettle bell - LAQ      PATIENT EDUCATION:  Education details: intro to aquatic therapy  Person educated: Patient Education method: Explanation  Education comprehension: verbalized understanding  HOME EXERCISE PROGRAM: Walking daily   ASSESSMENT:  CLINICAL IMPRESSION: Continue to focus on core/lumbar stabilization interventions today which she tolerated well. Pt needed to stretch out R knee between exercises, but able to complete full plinth program. Cuing provided for proper performance with bridges and PPT. Monitored R knee pain throughout session. She felt some relief in knee by end of session following HSS and gastroc stretching.    PN: Pt reports good response to 1st land session.  No pain or excessive fatigue. She  demonstrates improvements today in [place] increasing by 9 points reducing her fall risk.  Strength improved by at least 1/2 grade in hips.  Dtr reports pt is more mobile at home.  She has not yet begun walking a few times a week to and from mailbox due to the extreme hot weather. She is progressed now transitioning from aquatics to land increasing load for increased strengthening with good toleration.  Multiple goals met.  She will continue to benefit from skilled PT in both aquatic na dland settings to address continued deficits.     Initial Impression Patient is a 89 y.o. f who was seen today for physical therapy evaluation and treatment for LBP. She sustained a compression fx 5 years ago which continues to cause pain. As per pt, she is afraid of falling and because of that has been very sedentary. Has not had a fall in past year.  Main goals are to improve gait, toleration to amb and improve strength for overall improved function/mobility and health.  Exam indicates decreased strength throughout LE and core, balance and proprioceptive deficits with increased risk of falls, Left pelvic obliquity and gait deviation.  She will benefit from skilled physical therapy to improve all areas of deficits as well as QOL.  OBJECTIVE IMPAIRMENTS: Abnormal gait, decreased activity tolerance, decreased balance, decreased coordination, decreased knowledge of use of DME, decreased mobility, difficulty walking, decreased strength, postural dysfunction, and pain.   ACTIVITY LIMITATIONS: carrying, lifting, bending, standing, squatting, stairs, transfers, and locomotion level  PARTICIPATION LIMITATIONS: meal prep, cleaning, shopping, and community activity  PERSONAL FACTORS: Age, Fitness, Past/current experiences, and Time since onset of injury/illness/exacerbation are also affecting patient's functional outcome.   REHAB POTENTIAL: Good  CLINICAL DECISION MAKING: Evolving/moderate complexity  EVALUATION COMPLEXITY:  Moderate   GOALS: Goals reviewed with patient? Yes  SHORT TERM GOALS: Target date: 12/08/23  Pt will tolerate full aquatic sessions consistently without increase in pain and with improving function to demonstrate good toleration and effectiveness of intervention.  Baseline: Goal status:MET -12/10/23  2.  Pt will tolerate stair climbing using combination of an alternating and step to pattern ascending and descending 6 steps without use of handrail Baseline:  Goal status: Met 12/31/23  3.  Pt will report reduction of pain submerged to 0/10 Baseline:  Goal status: Met 12/31/23  4.  Pt will begin amb routinely 2-3 x weekly with daughter for exercise as HEP. Baseline:  Goal status: In progress 12/31/23; 01/21/24   LONG TERM GOALS: Target date: 02/07/24  Pt to improve on ODI by 13-15 (MCID)  %  to demonstrate statistically significant Improvement in function. Baseline:Modified Oswestry 23/50=46%  Goal status: INITIAL   2. Pt will amb x1000 ft without limitation to pain using ad Baseline:  Goal status: In progress 01/21/24  3.  Pt will improve on Berg balance test to >/= 40/56 to demonstrate a decrease in fall risk. Baseline: 31/56 Goal status: Met 01/21/24  4.  Pt will improve strength in all areas listed by 1 grade to demonstrate improved overall physical function Baseline:  Goal status: In progress 01/21/24  5.  Pt will demonstrate improved gait pattern with more normal step length with heel strike and toe off each step Baseline:  Goal status: INITIAL  6.  Pt will improve on Tug test to <or= 21s (avg for older adults) to demonstrate improvement in lower extremity function, mobility and decreased fall risk. Baseline:  Goal status: INITIAL  PLAN:  PT FREQUENCY: 2x/week  PT DURATION: 8 weeks  PLANNED INTERVENTIONS: 97164- PT Re-evaluation, 97110-Therapeutic exercises, 97530- Therapeutic activity, 97112- Neuromuscular re-education, 97535- Self Care, 02859- Manual therapy, [phone]-  Gait training, [phone]- Aquatic Therapy, [phone]- Electrical stimulation (unattended), [phone]- Ionotophoresis 4mg /ml Dexamethasone, Patient/Family education, Balance training, Stair training, Taping, Dry Needling, Joint mobilization, DME instructions, Cryotherapy, and Moist heat.  PLAN FOR NEXT SESSION: general core and LE strengthening, balance retraining, gait, AD instruction, pain managemen.  Begin aquatics only, transition land after a few weeks  Asberry Rodes, PTA  01/30/24 1:38 PM [facility] [address] [place], [place], [address] Phone: [phone]   Fax:  [phone]

## 2024-02-04 ENCOUNTER — Encounter (HOSPITAL_BASED_OUTPATIENT_CLINIC_OR_DEPARTMENT_OTHER): Payer: Self-pay | Admitting: Physical Therapy

## 2024-02-04 ENCOUNTER — Ambulatory Visit (HOSPITAL_BASED_OUTPATIENT_CLINIC_OR_DEPARTMENT_OTHER): Admitting: Physical Therapy

## 2024-02-04 DIAGNOSIS — M6281 Muscle weakness (generalized): Secondary | ICD-10-CM

## 2024-02-04 DIAGNOSIS — R2681 Unsteadiness on feet: Secondary | ICD-10-CM

## 2024-02-04 DIAGNOSIS — M5459 Other low back pain: Secondary | ICD-10-CM | POA: Diagnosis not present

## 2024-02-04 NOTE — Therapy (Signed)
 OUTPATIENT PHYSICAL THERAPY THORACOLUMBAR TREATMENT    Patient Name: Julie Park MRN: [id number] DOB:[birth year], 89 y.o., female Today's Date: 02/04/2024  END OF SESSION:  PT End of Session - 02/04/24 1146     Visit Number 13    Number of Visits 16    Date for PT Re-Evaluation 04/03/24    Authorization Type aetna Mcr    Progress Note Due on Visit 23    PT Start Time 1145    PT Stop Time 1225    PT Time Calculation (min) 40 min    Activity Tolerance Patient tolerated treatment well    Behavior During Therapy [facility] for tasks assessed/performed              Past Medical History:  Diagnosis Date   Anxiety    Chest pain 02/08/15   ETT normal   Diverticulosis    Diverticulitis (1982)   Fracture of rib of right side 08/16/2013   GERD (gastroesophageal reflux disease)    Hyperlipidemia, mixed    Hypothyroidism    IBS (irritable bowel syndrome)    Morton's neuroma    Right lumbar radiculopathy    Intermittent x 2 episodes in summer 2014   Shingles 1967 and 2010   Ulcer    Urine incontinence    Vertigo    Past Surgical History:  Procedure Laterality Date   CARDIOVASCULAR STRESS TEST  02/08/15   ETT normal   CATARACT EXTRACTION  2012   Dr. Cleatus   COLONOSCOPY  2005   Dr. Myriam at [facility] (normal per pt report)   DEXA  11/03/13   Heel T score -0.8.   FOOT NEUROMA SURGERY  1994   right foot   TONSILLECTOMY     TONSILLECTOMY AND ADENOIDECTOMY  1948   Dr. [facility]ia [facility]   TUBAL LIGATION  [phone]   WISDOM TOOTH EXTRACTION     Patient Active Problem List   Diagnosis Date Noted   Chronic low back pain 01/22/2024   High risk medication use 01/22/2024   Gastritis 12/23/2023   HTN (hypertension) 12/23/2023   Right hip pain 03/12/2023   Cardiac chest pain 08/20/2022   Lower extremity pain 08/20/2022   Gait abnormality 04/25/2021   Nevus of choroid of right eye 08/24/2019   Posterior vitreous detachment of right eye 08/24/2019   Pseudophakia of both  eyes 08/24/2019   GAD (generalized anxiety disorder) 07/11/2018   Overactive bladder 07/11/2018   Functional urinary incontinence 07/10/2018   Hyponatremia 03/31/2014   HLD (hyperlipidemia)    Diverticular disease    Anxiety state    GERD (gastroesophageal reflux disease)    Hypothyroidism    Urine incontinence    IRRITABLE BOWEL SYNDROME 03/02/2010    PCP: Gil Greig BRAVO, NP   REFERRING PROVIDER: Gil Greig BRAVO, NP   REFERRING DIAG: M54.41,M54.42,G89.29 (ICD-10-CM) - Chronic midline low back pain with bilateral sciatica   Rationale for Evaluation and Treatment: Rehabilitation  THERAPY DIAG:  Other low back pain  Unsteadiness on feet  Muscle weakness (generalized)  ONSET DATE: 2020  SUBJECTIVE:  SUBJECTIVE STATEMENT: Back pain continues to bother me after about 15 minutes of standing.  Overall I am so much better since beginning therapy.. My balance has improved tremendously, I can walk a flight of steps without gasping for air can get all the way up without resting.  POOL ACCESS: neighbor has pool.  She may consider joining [employer]  Initial Subjective Picked up a case of waters in 2020 and had compression fx  (T10 or 12) which has healed but pain has been consistent since then.  I also have oa right hip which hurts.  I am afraid of falling.  I don't move around much in my home.  Use a cane when I lave the house.  As long as I can have 2 fingers on the wall or sofa I can get around my house pretty good.  PERTINENT HISTORY:  Thoracic compression fx 2020 Oa hip right  PAIN:  Are you having pain? no: NPRS scale: 0/10 LB Pain location: mid to lower back Pain description: a Aggravating factors: walking 10 minutes Relieving factors: resting lying down  PRECAUTIONS: Fall  RED  FLAGS: None   WEIGHT BEARING RESTRICTIONS: No  FALLS:  Has patient fallen in last 6 months? No  LIVING ENVIRONMENT: Lives with: lives with their family Lives in: House/apartment Stairs: Yes: Internal: 12-14 steps; on right going up and on left going up and External: 0 steps; none Has following equipment at home: Quad cane large base, Environmental consultant - 4 wheeled, Marine scientist  OCCUPATION: retired  PLOF: Insurance account manager touch with walking through home    PATIENT GOALS: be able to walk for a period of time with little to  no pain in my back. Dtr wants mother to improve gait.  NEXT MD VISIT: as needed  OBJECTIVE:  Note: Objective measures were completed at Evaluation unless otherwise noted.  DIAGNOSTIC FINDINGS:  03/11/24 IMPRESSION: Moderate degenerative joint disease of right hip. No acute abnormality seen.  PATIENT SURVEYS:  Modified Oswestry 23/50=46%   COGNITION: Overall cognitive status: Within functional limits for tasks assessed     SENSATION: WFL  MUSCLE LENGTH: Hamstrings: mild restriction on B Gastrocnemius: moderate restriction on B   POSTURE: rounded shoulders, forward head, and flexed trunk (standing).  Left pelvic obliquity   LUMBAR ROM:   P!=Pain AROM eval 02/04/24  Flexion full full  Extension 50% limited P! 25% limited no P!  Right lateral flexion P! Full No P!  Left lateral flexion 50% limited P! 25% limited No P!  Right rotation    Left rotation     (Blank rows = not tested)    LOWER EXTREMITY MMT:    MMT Right eval Left eval R / L 01/21/24  Hip flexion 3+ 3+ 4 / 4  Hip extension     Hip abduction 4+ 4+ 5- / 5-  Hip adduction 5 5   Hip internal rotation     Hip external rotation     Knee flexion 3+ 3+ 4 / 4  Knee extension 3+ 3+ 4 / 4  Ankle dorsiflexion     Ankle plantarflexion     Ankle inversion     Ankle eversion      (Blank rows = not tested)    FUNCTIONAL TESTS:  30 seconds chair stand test: 3 from pool bench with  ue support Timed up and go (TUG): >30s BERG Balance Test          Date: eval  Sit to Stand 3  Standing unsupported 4  Sitting with back unsupported but feet supported 4  Stand to sit  2  Transfers  3  Standing unsupported with eyes closed 3  Standing unsupported feet together 3  From standing position, reach forward with outstretched arm 3  From standing position, pick up object from floor 3  From standing position, turn and look behind over each shoulder 1  Turn 360 2  Standing unsupported, alternately place foot on step 0  Standing unsupported, one foot in front 0  Standing on one leg 0  Total:  31/56   7/29 3. able to stand independently using hands  4. able to stand safely for 2 minutes  4. able to sit safely and securely for 2 minutes   4. sits safely with minimal use of hands  3. able to transfer safely with definite need of hands  4. able to stand 10 seconds safely  3. able to place feet together independently and stand 1 minute with supervision  3. can reach forward 12 cm (5 inches)    3. able to pick up slipper but needs supervision  2. turns sideways but only maintains balance    2. able to turn 360 degrees safely but slowly  2. able to complete 4 steps without aid with supervision    1. needs help to step but can hold 15 seconds   2. able to lift leg independently and hold >= 3 seconds   40/56     GAIT: Distance walked: 400 Assistive device utilized: Quad cane small base and Shower bench Level of assistance: Modified independence Comments: guarded, reduced step length and cadence, decreased heel strike and toe off  TREATMENT   [facility] Adult PT Treatment:     02/04/24                                            Pt seen for aquatic therapy today.  Treatment took place in water 3.5-4.75 ft in depth at the [facility] pool. Temp of water was 91.  Pt entered/exited the pool via stairs using step to and alternating pattern with hand rail.  *UE on  barbell->unsupported: walking forward, backward and side stepping, multiple widths. *Step ups bottom step leading R/L 2 x 5 *forward march with kick unsupported x 4 widths.  VC and demonstration for use of ue to balance without HB *stair tapping 1st step x 10 unsupported->then second x8 unsupported. (Good challenge 2nd step) *standing ue support wall 3.7 ft: toe raises; heel raises; HS curls; relaxed squats; high knee marching; hip add/abd x 12 reps *seated water bench: cycling; hip add/abd; LAQ *STS onto water step with cga-indep 2 x 5 *walking between exercises for recovery    Pt requires the buoyancy and hydrostatic pressure of water for support, and to offload joints by unweighting joint load by at least 50 % in navel deep water and by at least 75-80% in chest to neck deep water.  Viscosity of the water is needed for resistance of strengthening. Water current perturbations provides challenge to standing balance requiring increased core activation.     01/30/2024 - LTR x10ea - PPT 5 x10 - Bridges 2x10 - supine ball squeezes 5 x70min - supine clams with RTB x20 - TrA activation with RTB supine marches -Supine SLR with TA  x10bil -supine HSS manual bil -Standing R gastroc stretch  DATE:01/28/24  Pt seen for aquatic therapy today.  Treatment took place in water 3.5-4.75 ft in depth at the [facility] pool. Temp of water was 91.  Pt entered/exited the pool via stairs using step to and alternating pattern with hand rail.  *UE on barbell: walking forward, backward and side stepping, multiple widths. *forward march with kick ue support barbell *standing ue support wall 3.7 ft: toe raises; heel raises; HS curls; relaxed squats; high knee marching; hip add/abd x 12 reps *seated water bench: cycling; hip add/abd; LAQ *STS onto water step with cga-indep 2 x 5 *forward stepping down off of water step unsupported then up x 3 *step ups bottom step leading R/L 2 x 5 unsupported  *stair  tapping 1st step then second x8 unsupported. (Good challenge 2nd step)  Pt requires the buoyancy and hydrostatic pressure of water for support, and to offload joints by unweighting joint load by at least 50 % in navel deep water and by at least 75-80% in chest to neck deep water.  Viscosity of the water is needed for resistance of strengthening. Water current perturbations provides challenge to standing balance requiring increased core activation.         [facility] Adult PT Treatment:                                                 01/16/2024 - LTR - PPT  - Bridges  - supine ball squeezes  - supine clams with RTB  - TrA acitation 5 sec holds.  - TrA activation with RTB supine marches - Sit to stand with 5lb kettle bell - LAQ      PATIENT EDUCATION:  Education details: intro to aquatic therapy  Person educated: Patient Education method: Explanation  Education comprehension: verbalized understanding  HOME EXERCISE PROGRAM: Walking daily   ASSESSMENT:  CLINICAL IMPRESSION: Pt reports knee pain relief for a few days after last land session she attributes to HHS and gastroc stretching. LBP intermittent depending on activity. She progresses with aquatics today in balance as she amb throughout unsupported.  Gait is relaxed submerged normal heel strike and toe off as well as cadence and step length. She has been amb to and from setting using cane with dtr in addition to completing 45 min aquatic session without increase in pain or fatigue.  She has progressed with stair climbing exiting pool using alternating pattern. Pt and dtr report continued intolerance to standing longer than ~ 15 minutes before LBP increases. She will continue to benefit from skilled PT intervention but will continue transitioning to land based intervention for progression of strengthening, balance and gait retraining.  Plan to alternate session aquatics to continue working on balance retraining.    PN: Pt reports good  response to 1st land session.  No pain or excessive fatigue. She demonstrates improvements today in [place] increasing by 9 points reducing her fall risk.  Strength improved by at least 1/2 grade in hips.  Dtr reports pt is more mobile at home.  She has not yet begun walking a few times a week to and from mailbox due to the extreme hot weather. She is progressed now transitioning from aquatics to land increasing load for increased strengthening with good toleration.  Multiple goals met.  She will continue to benefit from skilled PT in both aquatic na dland settings to address continued deficits.  Initial Impression Patient is a 89 y.o. f who was seen today for physical therapy evaluation and treatment for LBP. She sustained a compression fx 5 years ago which continues to cause pain. As per pt, she is afraid of falling and because of that has been very sedentary. Has not had a fall in past year.  Main goals are to improve gait, toleration to amb and improve strength for overall improved function/mobility and health.  Exam indicates decreased strength throughout LE and core, balance and proprioceptive deficits with increased risk of falls, Left pelvic obliquity and gait deviation.  She will benefit from skilled physical therapy to improve all areas of deficits as well as QOL.  OBJECTIVE IMPAIRMENTS: Abnormal gait, decreased activity tolerance, decreased balance, decreased coordination, decreased knowledge of use of DME, decreased mobility, difficulty walking, decreased strength, postural dysfunction, and pain.   ACTIVITY LIMITATIONS: carrying, lifting, bending, standing, squatting, stairs, transfers, and locomotion level  PARTICIPATION LIMITATIONS: meal prep, cleaning, shopping, and community activity  PERSONAL FACTORS: Age, Fitness, Past/current experiences, and Time since onset of injury/illness/exacerbation are also affecting patient's functional outcome.   REHAB POTENTIAL: Good  CLINICAL  DECISION MAKING: Evolving/moderate complexity  EVALUATION COMPLEXITY: Moderate   GOALS: Goals reviewed with patient? Yes  SHORT TERM GOALS: Target date: 12/08/23  Pt will tolerate full aquatic sessions consistently without increase in pain and with improving function to demonstrate good toleration and effectiveness of intervention.  Baseline: Goal status:MET -12/10/23  2.  Pt will tolerate stair climbing using combination of an alternating and step to pattern ascending and descending 6 steps without use of handrail Baseline:  Goal status: Met 12/31/23  3.  Pt will report reduction of pain submerged to 0/10 Baseline:  Goal status: Met 12/31/23  4.  Pt will begin amb routinely 2-3 x weekly with daughter for exercise as HEP. Baseline:  Goal status: In progress 12/31/23; 01/21/24   LONG TERM GOALS: Target date: 04/03/24  Pt to improve on ODI by 13-15 (MCID)  % to demonstrate statistically significant Improvement in function. Baseline:Modified Oswestry 23/50=46%  Goal status: INITIAL   2. Pt will amb x1000 ft without limitation to pain using ad Baseline:  Goal status: In progress 01/21/24; 02/04/24  3.  Pt will improve on Berg balance test to >/= 40/56 to demonstrate a decrease in fall risk. Baseline: 31/56 Goal status: Met 01/21/24  4.  Pt will improve strength in all areas listed by 1 grade to demonstrate improved overall physical function Baseline:  Goal status: In progress 01/21/24  5.  Pt will demonstrate improved gait pattern with more normal step length with heel strike and toe off each step Baseline:  Goal status: In progress 02/04/24  6.  Pt will improve on Tug test to <or= 21s (avg for older adults) to demonstrate improvement in lower extremity function, mobility and decreased fall risk. Baseline:  Goal status: INITIAL  PLAN:  PT FREQUENCY: 2x/week  PT DURATION: 8 weeks  PLANNED INTERVENTIONS: 97164- PT Re-evaluation, 97110-Therapeutic exercises, 97530- Therapeutic  activity, 97112- Neuromuscular re-education, 97535- Self Care, [id number]- Manual therapy, [phone]- Gait training, [phone]- Aquatic Therapy, [phone]- Electrical stimulation (unattended), [phone]- Ionotophoresis 4mg /ml Dexamethasone, Patient/Family education, Balance training, Stair training, Taping, Dry Needling, Joint mobilization, DME instructions, Cryotherapy, and Moist heat.  PLAN FOR NEXT SESSION: general core and LE strengthening, balance retraining, gait, AD instruction, pain management.   Land: progressive balance and strengthening of core and LB  Ronal (Frankie) Avory Rahimi MPT 02/04/24 1:51 PM [facility] [phone]  [place], [place], [address] Phone: [phone]   Fax:  [phone]

## 2024-02-06 ENCOUNTER — Ambulatory Visit (HOSPITAL_BASED_OUTPATIENT_CLINIC_OR_DEPARTMENT_OTHER)

## 2024-02-06 ENCOUNTER — Other Ambulatory Visit: Payer: Self-pay | Admitting: Orthopedic Surgery

## 2024-02-06 ENCOUNTER — Encounter (HOSPITAL_BASED_OUTPATIENT_CLINIC_OR_DEPARTMENT_OTHER): Payer: Self-pay

## 2024-02-06 DIAGNOSIS — R2681 Unsteadiness on feet: Secondary | ICD-10-CM

## 2024-02-06 DIAGNOSIS — M5459 Other low back pain: Secondary | ICD-10-CM

## 2024-02-06 DIAGNOSIS — M6281 Muscle weakness (generalized): Secondary | ICD-10-CM

## 2024-02-06 NOTE — Therapy (Signed)
 OUTPATIENT PHYSICAL THERAPY THORACOLUMBAR TREATMENT    Patient Name: Julie Park MRN: [id number] DOB:1933-11-25, 89 y.o., female Today's Date: 02/06/2024  END OF SESSION:  PT End of Session - 02/06/24 1216     Visit Number 14    Number of Visits 16    Date for PT Re-Evaluation 04/03/24    Authorization Type aetna Mcr    Progress Note Due on Visit 23    PT Start Time 1145    PT Stop Time 1230    PT Time Calculation (min) 45 min    Activity Tolerance Patient tolerated treatment well    Behavior During Therapy [facility] for tasks assessed/performed               Past Medical History:  Diagnosis Date   Anxiety    Chest pain 02/08/15   ETT normal   Diverticulosis    Diverticulitis (1982)   Fracture of rib of right side 08/16/2013   GERD (gastroesophageal reflux disease)    Hyperlipidemia, mixed    Hypothyroidism    IBS (irritable bowel syndrome)    Morton's neuroma    Right lumbar radiculopathy    Intermittent x 2 episodes in summer 2014   Shingles 1967 and 2010   Ulcer    Urine incontinence    Vertigo    Past Surgical History:  Procedure Laterality Date   CARDIOVASCULAR STRESS TEST  02/08/15   ETT normal   CATARACT EXTRACTION  2012   Dr. Cleatus   COLONOSCOPY  2005   Dr. Myriam at [facility] (normal per pt report)   DEXA  11/03/13   Heel T score -0.8.   FOOT NEUROMA SURGERY  1994   right foot   TONSILLECTOMY     TONSILLECTOMY AND ADENOIDECTOMY  1948   Dr. Neomia Benne [facility]   TUBAL LIGATION  [phone]   WISDOM TOOTH EXTRACTION     Patient Active Problem List   Diagnosis Date Noted   Chronic low back pain 01/22/2024   High risk medication use 01/22/2024   Gastritis 12/23/2023   HTN (hypertension) 12/23/2023   Right hip pain 03/12/2023   Cardiac chest pain 08/20/2022   Lower extremity pain 08/20/2022   Gait abnormality 04/25/2021   Nevus of choroid of right eye 08/24/2019   Posterior vitreous detachment of right eye 08/24/2019   Pseudophakia of both  eyes 08/24/2019   GAD (generalized anxiety disorder) 07/11/2018   Overactive bladder 07/11/2018   Functional urinary incontinence 07/10/2018   Hyponatremia 03/31/2014   HLD (hyperlipidemia)    Diverticular disease    Anxiety state    GERD (gastroesophageal reflux disease)    Hypothyroidism    Urine incontinence    IRRITABLE BOWEL SYNDROME 03/02/2010    PCP: Gil Greig BRAVO, NP   REFERRING PROVIDER: Gil Greig BRAVO, NP   REFERRING DIAG: M54.41,M54.42,G89.29 (ICD-10-CM) - Chronic midline low back pain with bilateral sciatica   Rationale for Evaluation and Treatment: Rehabilitation  THERAPY DIAG:  Other low back pain  Unsteadiness on feet  Muscle weakness (generalized)  ONSET DATE: 2020  SUBJECTIVE:  SUBJECTIVE STATEMENT: A little sore after pool therapy. Had some discomfort when climbing stairs at home today. Feels no pain at entry. Had a little back pain last weekend.   POOL ACCESS: neighbor has pool.  She may consider joining [employer]  Initial Subjective Picked up a case of waters in 2020 and had compression fx  (T10 or 12) which has healed but pain has been consistent since then.  I also have oa right hip which hurts.  I am afraid of falling.  I don't move around much in my home.  Use a cane when I lave the house.  As long as I can have 2 fingers on the wall or sofa I can get around my house pretty good.  PERTINENT HISTORY:  Thoracic compression fx 2020 Oa hip right  PAIN:  Are you having pain? no: NPRS scale: 0/10 LB Pain location: mid to lower back Pain description: a Aggravating factors: walking 10 minutes Relieving factors: resting lying down  PRECAUTIONS: Fall  RED FLAGS: None   WEIGHT BEARING RESTRICTIONS: No  FALLS:  Has patient fallen in last 6 months? No  LIVING  ENVIRONMENT: Lives with: lives with their family Lives in: House/apartment Stairs: Yes: Internal: 12-14 steps; on right going up and on left going up and External: 0 steps; none Has following equipment at home: Quad cane large base, Environmental consultant - 4 wheeled, Marine scientist  OCCUPATION: retired  PLOF: Insurance account manager touch with walking through home    PATIENT GOALS: be able to walk for a period of time with little to  no pain in my back. Dtr wants mother to improve gait.  NEXT MD VISIT: as needed  OBJECTIVE:  Note: Objective measures were completed at Evaluation unless otherwise noted.  DIAGNOSTIC FINDINGS:  03/11/24 IMPRESSION: Moderate degenerative joint disease of right hip. No acute abnormality seen.  PATIENT SURVEYS:  Modified Oswestry 23/50=46%   COGNITION: Overall cognitive status: Within functional limits for tasks assessed     SENSATION: WFL  MUSCLE LENGTH: Hamstrings: mild restriction on B Gastrocnemius: moderate restriction on B   POSTURE: rounded shoulders, forward head, and flexed trunk (standing).  Left pelvic obliquity   LUMBAR ROM:   P!=Pain AROM eval 02/04/24  Flexion full full  Extension 50% limited P! 25% limited no P!  Right lateral flexion P! Full No P!  Left lateral flexion 50% limited P! 25% limited No P!  Right rotation    Left rotation     (Blank rows = not tested)    LOWER EXTREMITY MMT:    MMT Right eval Left eval R / L 01/21/24  Hip flexion 3+ 3+ 4 / 4  Hip extension     Hip abduction 4+ 4+ 5- / 5-  Hip adduction 5 5   Hip internal rotation     Hip external rotation     Knee flexion 3+ 3+ 4 / 4  Knee extension 3+ 3+ 4 / 4  Ankle dorsiflexion     Ankle plantarflexion     Ankle inversion     Ankle eversion      (Blank rows = not tested)    FUNCTIONAL TESTS:  30 seconds chair stand test: 3 from pool bench with ue support Timed up and go (TUG): >30s BERG Balance Test          Date: eval  Sit to Stand 3  Standing  unsupported 4  Sitting with back unsupported but feet supported 4  Stand to sit  2  Transfers  3  Standing unsupported with eyes closed 3  Standing unsupported feet together 3  From standing position, reach forward with outstretched arm 3  From standing position, pick up object from floor 3  From standing position, turn and look behind over each shoulder 1  Turn 360 2  Standing unsupported, alternately place foot on step 0  Standing unsupported, one foot in front 0  Standing on one leg 0  Total:  31/56   7/29 3. able to stand independently using hands  4. able to stand safely for 2 minutes  4. able to sit safely and securely for 2 minutes   4. sits safely with minimal use of hands  3. able to transfer safely with definite need of hands  4. able to stand 10 seconds safely  3. able to place feet together independently and stand 1 minute with supervision  3. can reach forward 12 cm (5 inches)    3. able to pick up slipper but needs supervision  2. turns sideways but only maintains balance    2. able to turn 360 degrees safely but slowly  2. able to complete 4 steps without aid with supervision    1. needs help to step but can hold 15 seconds   2. able to lift leg independently and hold >= 3 seconds   40/56     GAIT: Distance walked: 400 Assistive device utilized: Personal assistant bench Level of assistance: Modified independence Comments: guarded, reduced step length and cadence, decreased heel strike and toe off  TREATMENT   [facility] Adult PT Treatment:      02/06/2024 - LTR x20ea - PPT 5 2x10 - Bridges 2x10 - supine ball squeezes 5 x20 - supine clams with RTB x20 - TrA activation with RTB supine marches x20 -supine HSS manual bil -Standing R gastroc stretch  02/04/24                                            Pt seen for aquatic therapy today.  Treatment took place in water 3.5-4.75 ft in depth at the [facility] pool. Temp of water was  91.  Pt entered/exited the pool via stairs using step to and alternating pattern with hand rail.  *UE on barbell->unsupported: walking forward, backward and side stepping, multiple widths. *Step ups bottom step leading R/L 2 x 5 *forward march with kick unsupported x 4 widths.  VC and demonstration for use of ue to balance without HB *stair tapping 1st step x 10 unsupported->then second x8 unsupported. (Good challenge 2nd step) *standing ue support wall 3.7 ft: toe raises; heel raises; HS curls; relaxed squats; high knee marching; hip add/abd x 12 reps *seated water bench: cycling; hip add/abd; LAQ *STS onto water step with cga-indep 2 x 5 *walking between exercises for recovery    Pt requires the buoyancy and hydrostatic pressure of water for support, and to offload joints by unweighting joint load by at least 50 % in navel deep water and by at least 75-80% in chest to neck deep water.  Viscosity of the water is needed for resistance of strengthening. Water current perturbations provides challenge to standing balance requiring increased core activation.     01/30/2024 - LTR x10ea - PPT 5 x10 - Bridges 2x10 - supine ball squeezes 5 x63min - supine clams with RTB  x20 - TrA activation with RTB supine marches -Supine SLR with TA  x10bil -supine HSS manual bil -Standing R gastroc stretch  DATE:01/28/24 Pt seen for aquatic therapy today.  Treatment took place in water 3.5-4.75 ft in depth at the [facility] pool. Temp of water was 91.  Pt entered/exited the pool via stairs using step to and alternating pattern with hand rail.  *UE on barbell: walking forward, backward and side stepping, multiple widths. *forward march with kick ue support barbell *standing ue support wall 3.7 ft: toe raises; heel raises; HS curls; relaxed squats; high knee marching; hip add/abd x 12 reps *seated water bench: cycling; hip add/abd; LAQ *STS onto water step with cga-indep 2 x 5 *forward stepping  down off of water step unsupported then up x 3 *step ups bottom step leading R/L 2 x 5 unsupported  *stair tapping 1st step then second x8 unsupported. (Good challenge 2nd step)  Pt requires the buoyancy and hydrostatic pressure of water for support, and to offload joints by unweighting joint load by at least 50 % in navel deep water and by at least 75-80% in chest to neck deep water.  Viscosity of the water is needed for resistance of strengthening. Water current perturbations provides challenge to standing balance requiring increased core activation.         [facility] Adult PT Treatment:                                                 01/16/2024 - LTR - PPT  - Bridges  - supine ball squeezes  - supine clams with RTB  - TrA acitation 5 sec holds.  - TrA activation with RTB supine marches - Sit to stand with 5lb kettle bell - LAQ      PATIENT EDUCATION:  Education details: intro to aquatic therapy  Person educated: Patient Education method: Explanation  Education comprehension: verbalized understanding  HOME EXERCISE PROGRAM: Walking daily   ASSESSMENT:  CLINICAL IMPRESSION: Continued to work on [facility] with core/lumbar stabilization. She denied back pain with exercises. Again performed stretching for HS and gastroc mm to decrease tightness due to reported benefit last session. Good tolerance for all exercises. Will continue to progress as tolerated. Re-eval next session.     PN: Pt reports good response to 1st land session.  No pain or excessive fatigue. She demonstrates improvements today in [place] increasing by 9 points reducing her fall risk.  Strength improved by at least 1/2 grade in hips.  Dtr reports pt is more mobile at home.  She has not yet begun walking a few times a week to and from mailbox due to the extreme hot weather. She is progressed now transitioning from aquatics to land increasing load for increased strengthening with good toleration.  Multiple goals met.  She will  continue to benefit from skilled PT in both aquatic na dland settings to address continued deficits.     Initial Impression Patient is a 89 y.o. f who was seen today for physical therapy evaluation and treatment for LBP. She sustained a compression fx 5 years ago which continues to cause pain. As per pt, she is afraid of falling and because of that has been very sedentary. Has not had a fall in past year.  Main goals are to improve gait, toleration to amb and improve strength for overall  improved function/mobility and health.  Exam indicates decreased strength throughout LE and core, balance and proprioceptive deficits with increased risk of falls, Left pelvic obliquity and gait deviation.  She will benefit from skilled physical therapy to improve all areas of deficits as well as QOL.  OBJECTIVE IMPAIRMENTS: Abnormal gait, decreased activity tolerance, decreased balance, decreased coordination, decreased knowledge of use of DME, decreased mobility, difficulty walking, decreased strength, postural dysfunction, and pain.   ACTIVITY LIMITATIONS: carrying, lifting, bending, standing, squatting, stairs, transfers, and locomotion level  PARTICIPATION LIMITATIONS: meal prep, cleaning, shopping, and community activity  PERSONAL FACTORS: Age, Fitness, Past/current experiences, and Time since onset of injury/illness/exacerbation are also affecting patient's functional outcome.   REHAB POTENTIAL: Good  CLINICAL DECISION MAKING: Evolving/moderate complexity  EVALUATION COMPLEXITY: Moderate   GOALS: Goals reviewed with patient? Yes  SHORT TERM GOALS: Target date: 12/08/23  Pt will tolerate full aquatic sessions consistently without increase in pain and with improving function to demonstrate good toleration and effectiveness of intervention.  Baseline: Goal status:MET -12/10/23  2.  Pt will tolerate stair climbing using combination of an alternating and step to pattern ascending and descending 6  steps without use of handrail Baseline:  Goal status: Met 12/31/23  3.  Pt will report reduction of pain submerged to 0/10 Baseline:  Goal status: Met 12/31/23  4.  Pt will begin amb routinely 2-3 x weekly with daughter for exercise as HEP. Baseline:  Goal status: In progress 12/31/23; 01/21/24   LONG TERM GOALS: Target date: 04/03/24  Pt to improve on ODI by 13-15 (MCID)  % to demonstrate statistically significant Improvement in function. Baseline:Modified Oswestry 23/50=46%  Goal status: INITIAL   2. Pt will amb x1000 ft without limitation to pain using ad Baseline:  Goal status: In progress 01/21/24; 02/04/24  3.  Pt will improve on Berg balance test to >/= 40/56 to demonstrate a decrease in fall risk. Baseline: 31/56 Goal status: Met 01/21/24  4.  Pt will improve strength in all areas listed by 1 grade to demonstrate improved overall physical function Baseline:  Goal status: In progress 01/21/24  5.  Pt will demonstrate improved gait pattern with more normal step length with heel strike and toe off each step Baseline:  Goal status: In progress 02/04/24  6.  Pt will improve on Tug test to <or= 21s (avg for older adults) to demonstrate improvement in lower extremity function, mobility and decreased fall risk. Baseline:  Goal status: INITIAL  PLAN:  PT FREQUENCY: 2x/week  PT DURATION: 8 weeks  PLANNED INTERVENTIONS: 97164- PT Re-evaluation, 97110-Therapeutic exercises, 97530- Therapeutic activity, 97112- Neuromuscular re-education, 97535- Self Care, 02859- Manual therapy, [phone]- Gait training, [phone]- Aquatic Therapy, [phone]- Electrical stimulation (unattended), [phone]- Ionotophoresis 4mg /ml Dexamethasone, Patient/Family education, Balance training, Stair training, Taping, Dry Needling, Joint mobilization, DME instructions, Cryotherapy, and Moist heat.  PLAN FOR NEXT SESSION: general core and LE strengthening, balance retraining, gait, AD instruction, pain management.   Land:  progressive balance and strengthening of core and LB  Asberry Rodes, PTA  02/06/24 4:53 PM [facility] [address] [place], [place], [address] Phone: [phone]   Fax:  [phone]

## 2024-02-17 ENCOUNTER — Other Ambulatory Visit: Payer: Self-pay | Admitting: Internal Medicine

## 2024-02-20 ENCOUNTER — Encounter: Admitting: Orthopedic Surgery

## 2024-02-21 ENCOUNTER — Other Ambulatory Visit

## 2024-02-21 ENCOUNTER — Ambulatory Visit: Attending: Internal Medicine | Admitting: Internal Medicine

## 2024-02-21 ENCOUNTER — Other Ambulatory Visit (HOSPITAL_COMMUNITY)
Admission: RE | Admit: 2024-02-21 | Discharge: 2024-02-21 | Disposition: A | Discharge status: Home / Self Care | Source: Ambulatory Visit | Attending: Internal Medicine | Admitting: Internal Medicine

## 2024-02-21 VITALS — BP 135/70 | HR 66 | Wt 173.2 lb

## 2024-02-21 DIAGNOSIS — I493 Ventricular premature depolarization: Secondary | ICD-10-CM | POA: Diagnosis not present

## 2024-02-21 DIAGNOSIS — I10 Essential (primary) hypertension: Secondary | ICD-10-CM

## 2024-02-21 DIAGNOSIS — R0609 Other forms of dyspnea: Secondary | ICD-10-CM

## 2024-02-21 LAB — BRAIN NATRIURETIC PEPTIDE: B Natriuretic Peptide: 107 pg/mL — ABNORMAL HIGH (ref 0.0–100.0)

## 2024-02-21 MED ORDER — FUROSEMIDE 20 MG PO TABS: 20.0000 mg | ORAL_TABLET | Freq: Every day | ORAL | 3 refills | Status: AC

## 2024-02-21 NOTE — Progress Notes (Signed)
 Cardiology Office Note  Date: 02/21/2024   ID: Julie Park, DOB 05-May-1934, MRN [id number]  PCP:  Almarie Waddell NOVAK, NP  Cardiologist:  Diannah SHAUNNA Maywood, MD Electrophysiologist:  None   Reason for Office Visit: Chest pain follow-up   History of Present Illness: Julie Park is a 89 y.o. female known to have hypothyroidism, HLD is here for follow-up visit.  Patient was initially referred to cardiology clinic in 07/2022 for chest tightness. Patient reported having substernal chest tightness radiating to left neck and left jaw x 2 months, frequency once per week, lasts for 15 minutes, occurs with exertion (while washing dishes) and resolves with rest. Associated with SOB. NM stress test from 3/24 showed no evidence of ischemia. Echocardiogram from 08/2022 showed normal LVEF and no valve abnormalities.  ABI is also within normal limits.  After starting metoprolol  tartrate, her chest pain has completely resolved.  She is here today for follow-up visit.  EKG today showed NSR with frequent PVCs.  These PVCs are new.  She reported having sporadic episodes of dizziness once in 2 months.  However she noticed worsening DOE in the last few months.  She complained of orthopnea, PND as well.  No leg swelling.  No chest pain.  Past Medical History:  Diagnosis Date   Anxiety    Chest pain 02/08/15   ETT normal   Diverticulosis    Diverticulitis (1982)   Fracture of rib of right side 08/16/2013   GERD (gastroesophageal reflux disease)    Hyperlipidemia, mixed    Hypothyroidism    IBS (irritable bowel syndrome)    Morton's neuroma    Right lumbar radiculopathy    Intermittent x 2 episodes in summer 2014   Shingles 1967 and 2010   Ulcer    Urine incontinence    Vertigo     Past Surgical History:  Procedure Laterality Date   CARDIOVASCULAR STRESS TEST  02/08/15   ETT normal   CATARACT EXTRACTION  2012   Dr. Cleatus   COLONOSCOPY  2005   Dr. Myriam at [facility] (normal per pt report)    DEXA  11/03/13   Heel T score -0.8.   FOOT NEUROMA SURGERY  1994   right foot   TONSILLECTOMY     TONSILLECTOMY AND ADENOIDECTOMY  1948   Dr. Neomia Benne [facility]   TUBAL LIGATION  [phone]   WISDOM TOOTH EXTRACTION      Current Outpatient Medications  Medication Sig Dispense Refill   acetaminophen  (TYLENOL ) 325 MG tablet Take 2 tablets (650 mg total) by mouth every 6 (six) hours as needed. 30 tablet 0   aspirin  81 MG tablet Take 81 mg by mouth every morning.      atorvastatin  (LIPITOR) 10 MG tablet TAKE 1 TABLET DAILY 90 tablet 1   Fiber POWD Take by mouth.     fluticasone  (FLONASE ) 50 MCG/ACT nasal spray SPRAY 2 SPRAYS INTO EACH NOSTRIL EVERY DAY 48 mL 3   gabapentin  (NEURONTIN ) 300 MG capsule Take 2 capsules (600 mg total) by mouth 3 (three) times daily. 180 capsule 11   levothyroxine  (SYNTHROID ) 88 MCG tablet Take 1 tablet (88 mcg total) by mouth daily. 90 tablet 1   loratadine  (CLARITIN ) 10 MG tablet TAKE 1 TABLET BY MOUTH EVERY DAY 90 tablet 3   LORazepam  (ATIVAN ) 1 MG tablet TAKE 1 TABLET (1 MG TOTAL) BY MOUTH EVERY 8 (EIGHT) HOURS AS NEEDED. FOR ANXIETY 90 tablet 0   Magnesium  250 MG TABS  Take 2 tablets by mouth daily.      metoprolol  tartrate (LOPRESSOR ) 25 MG tablet TAKE 1 TABLET BY MOUTH TWICE A DAY 15 tablet 0   mupirocin  ointment (BACTROBAN ) 2 % Apply 1 Application topically 2 (two) times daily. 22 g 0   nitroGLYCERIN  (NITROSTAT ) 0.4 MG SL tablet PLACE 1 TABLET (0.4 MG TOTAL) UNDER THE TONGUE EVERY 5 (FIVE) MINUTES X 3 DOSES AS NEEDED FOR CHEST PAIN (IF NO RELIEF AFTER 3RD DOSE, PROCEED TO ED OR CALL 911). 12 tablet 0   nystatin  cream (MYCOSTATIN ) APPLY 1 APPLICATION TOPICALLY AS NEEDED FOR DRY SKIN. 30 g 3   omeprazole  (PRILOSEC) 40 MG capsule TAKE 1 CAPSULE EVERY DAY 90 capsule 1   ondansetron  (ZOFRAN ) 4 MG tablet Take 1 tablet (4 mg total) by mouth every 8 (eight) hours as needed for nausea or vomiting. 20 tablet 0   Propylene Glycol (SYSTANE BALANCE) 0.6 % SOLN Apply  to eye daily as needed.     sodium chloride  (OCEAN) 0.65 % nasal spray Place 1 spray into the nose as needed for congestion. 30 mL 12   traMADol  (ULTRAM ) 50 MG tablet TAKE 1/2 TABLET BY MOUTH DAILY (Patient not taking: Reported on 02/21/2024) 15 tablet 0   Current Facility-Administered Medications  Medication Dose Route Frequency Provider Last Rate Last Admin   denosumab  (PROLIA ) injection 60 mg  60 mg Subcutaneous Q6 months [place], Amy E, NP       Allergies:  Patient has no known allergies.   Social History: The patient  reports that she has quit smoking. Her smoking use included cigarettes. She started smoking about 40 years ago. She has a 40.7 pack-year smoking history. She has never used smokeless tobacco. She reports that she does not drink alcohol and does not use drugs.   Family History: The patient's family history includes Asthma in her daughter; Heart attack in her paternal grandfather; Pancreatic cancer in her father; Tuberculosis in her mother.   ROS:  Please see the history of present illness. Otherwise, complete review of systems is positive for none.  All other systems are reviewed and negative.   Physical Exam: VS:  BP 135/70   Pulse 66   Wt 173 lb 3.2 oz (78.6 kg)   SpO2 98%   BMI 29.73 kg/m , BMI Body mass index is 29.73 kg/m.  Wt Readings from Last 3 Encounters:  02/21/24 173 lb 3.2 oz (78.6 kg)  01/22/24 173 lb (78.5 kg)  12/23/23 173 lb (78.5 kg)    General: Patient appears comfortable at rest. HEENT: Conjunctiva and lids normal, oropharynx clear with moist mucosa. Neck: Supple, no elevated JVP or carotid bruits, no thyromegaly. Lungs: Clear to auscultation, nonlabored breathing at rest. Cardiac: Regular rate and rhythm, no S3 or significant systolic murmur, no pericardial rub. Abdomen: Soft, nontender, no hepatomegaly, bowel sounds present, no guarding or rebound. Extremities: No pitting edema, distal pulses 2+. Skin: Warm and dry. Musculoskeletal: No  kyphosis. Neuropsychiatric: Alert and oriented x3, affect grossly appropriate.  Recent Labwork: 01/22/2024: ALT 13; AST 17; BUN 11; Creatinine, Ser 0.90; Hemoglobin 13.8; Platelets 235.0; Potassium 4.6; Sodium 137; TSH 0.15     Component Value Date/Time   CHOL 139 01/22/2024 1125   TRIG 127.0 01/22/2024 1125   HDL 46.80 01/22/2024 1125   CHOLHDL 3 01/22/2024 1125   VLDL 25.4 01/22/2024 1125   LDLCALC 67 01/22/2024 1125   LDLCALC 74 02/06/2023 0934    Other Studies Reviewed Today: NM stress test in 08/2022 No  evidence of ischemia  Echocardiogram in 08/2022 Normal LVEF No valve abnormalities  Vascular ABI in 08/2022 No evidence of poor circulation in lower extremities  Assessment and Plan:   Cardiac chest pain: Resolved after starting metoprolol .  Continue metoprolol  titrate 25 mg twice daily.  NM stress test in 2024 and echocardiogram in 2024 were unremarkable.  Frequent PVCs: New on EKG today.  Worsening DOE in the last few months and sporadic episodes of dizziness.  Obtain 2-week event monitor, live.  DOE: Worsening DOE in the last few months.  Complained of orthopnea and PND as well.  No leg swelling.  Obtain BNP.  Update echocardiogram.  Start p.o. Lasix  20 mg once daily.  HLD, unknown values: Continue atorvastatin  10 mg nightly.  Goal LDL less than 100.  I have spent a total of 30 minutes with patient reviewing chart, EKGs, labs and examining patient as well as establishing an assessment and plan that was discussed with the patient.  > 50% of time was spent in direct patient care.     Medication Adjustments/Labs and Tests Ordered: Current medicines are reviewed at length with the patient today.  Concerns regarding medicines are outlined above.   Tests Ordered: Orders Placed This Encounter  Procedures   EKG 12-Lead    Medication Changes: No orders of the defined types were placed in this encounter.   Disposition:  Follow up 3 months  Signed Janaysha Depaulo Priya  Malaney Mcbean, MD, 02/21/2024 11:35 AM    [facility] [address] [place], [place], [place] [address]

## 2024-02-21 NOTE — Patient Instructions (Addendum)
 Medication Instructions:   START Lasix  20 mg daily  Labwork: BNP today  Testing/Procedures: Your physician has requested that you have an echocardiogram. Echocardiography is a painless test that uses sound waves to create images of your heart. It provides your doctor with information about the size and shape of your heart and how well your heart's chambers and valves are working. This procedure takes approximately one hour. There are no restrictions for this procedure. Please do NOT wear cologne, perfume, aftershave, or lotions (deodorant is allowed). Please arrive 15 minutes prior to your appointment time.  Please note: We ask at that you not bring children with you during ultrasound (echo/ vascular) testing. Due to room size and safety concerns, children are not allowed in the ultrasound rooms during exams. Our front office staff cannot provide observation of children in our lobby area while testing is being conducted. An adult accompanying a patient to their appointment will only be allowed in the ultrasound room at the discretion of the ultrasound technician under special circumstances. We apologize for any inconvenience.         ZIO XT- Long Term Monitor Instructions   Your physician has requested you wear your LIVE  ZIO patch monitor____14___days.   This is a single patch monitor.  [employer] supplies one patch monitor per enrollment.  Additional stickers are not available.   Please do not apply patch if you will be having a Nuclear Stress Test, Echocardiogram, Cardiac CT, MRI, or Chest Xray during the time frame you would be wearing the monitor. The patch cannot be worn during these tests.  You cannot remove and re-apply the ZIO XT patch monitor.   Your ZIO patch monitor will be sent USPS Priority mail from [facility] directly to your home address. The monitor may also be mailed to a PO BOX if home delivery is not available.   It may take 3-5 days to receive your monitor  after you have been enrolled.   Once you have received you monitor, please review enclosed instructions.  Your monitor has already been registered assigning a specific monitor serial # to you.   Applying the monitor   Shave hair from upper left chest.   Hold abrader disc by orange tab.  Rub abrader in 40 strokes over left upper chest as indicated in your monitor instructions.   Clean area with 4 enclosed alcohol pads .  Use all pads to assure are is cleaned thoroughly.  Let dry.   Apply patch as indicated in monitor instructions.  Patch will be place under collarbone on left side of chest with arrow pointing upward.   Rub patch adhesive wings for 2 minutes.Remove white label marked 1.  Remove white label marked 2.  Rub patch adhesive wings for 2 additional minutes.   While looking in a mirror, press and release button in center of patch.  A small green light will flash 3-4 times .  This will be your only indicator the monitor has been turned on.     Do not shower for the first 24 hours.  You may shower after the first 24 hours.   Press button if you feel a symptom. You will hear a small click.  Record Date, Time and Symptom in the Patient Log Book.   When you are ready to remove patch, follow instructions on last 2 pages of Patient Log Book.  Stick patch monitor onto last page of Patient Log Book.   Place Patient Log Book in [place] box.  Use locking tab on box and tape box closed securely.  The [employer] and [employer] has [employer] on it.  Please place in mailbox as soon as possible.  Your physician should have your test results approximately 7 days after the monitor has been mailed back to [facility].   Call [facility] Customer Care at [phone] if you have questions regarding your ZIO XT patch monitor.  Call them immediately if you see an orange light blinking on your monitor.   If your monitor falls off in less than 4 days contact our Monitor department at  [phone].  If your monitor becomes loose or falls off after 4 days call [employer] at [phone] for suggestions on securing your monitor.    Follow-Up: 3 months  Any Other Special Instructions Will Be Listed Below (If Applicable).  If you need a refill on your cardiac medications before your next appointment, please call your pharmacy.

## 2024-02-26 ENCOUNTER — Other Ambulatory Visit: Payer: Self-pay | Admitting: Orthopedic Surgery

## 2024-02-26 ENCOUNTER — Other Ambulatory Visit: Payer: Self-pay | Admitting: Family

## 2024-02-26 ENCOUNTER — Encounter (HOSPITAL_BASED_OUTPATIENT_CLINIC_OR_DEPARTMENT_OTHER): Payer: Self-pay | Admitting: Physical Therapy

## 2024-02-26 ENCOUNTER — Ambulatory Visit (HOSPITAL_BASED_OUTPATIENT_CLINIC_OR_DEPARTMENT_OTHER): Attending: Orthopedic Surgery | Admitting: Physical Therapy

## 2024-02-26 DIAGNOSIS — R2681 Unsteadiness on feet: Secondary | ICD-10-CM | POA: Diagnosis present

## 2024-02-26 DIAGNOSIS — M5459 Other low back pain: Secondary | ICD-10-CM | POA: Insufficient documentation

## 2024-02-26 DIAGNOSIS — M6281 Muscle weakness (generalized): Secondary | ICD-10-CM | POA: Diagnosis present

## 2024-02-26 DIAGNOSIS — F419 Anxiety disorder, unspecified: Secondary | ICD-10-CM

## 2024-02-26 NOTE — Therapy (Signed)
 OUTPATIENT PHYSICAL THERAPY THORACOLUMBAR TREATMENT    Patient Name: Julie Park MRN: [id number] DOB:[birth year], 89 y.o., female Today's Date: 02/27/2024  END OF SESSION:  PT End of Session - 02/27/24 1653     Visit Number 15    Number of Visits 16    Date for PT Re-Evaluation 04/03/24    Authorization Type hulan Mcr    PT Start Time 1323    PT Stop Time 1407    PT Time Calculation (min) 44 min    Activity Tolerance Patient tolerated treatment well    Behavior During Therapy [facility] for tasks assessed/performed                Past Medical History:  Diagnosis Date   Anxiety    Chest pain 02/08/15   ETT normal   Diverticulosis    Diverticulitis (1982)   Fracture of rib of right side 08/16/2013   GERD (gastroesophageal reflux disease)    Hyperlipidemia, mixed    Hypothyroidism    IBS (irritable bowel syndrome)    Morton's neuroma    Right lumbar radiculopathy    Intermittent x 2 episodes in summer 2014   Shingles 1967 and 2010   Ulcer    Urine incontinence    Vertigo    Past Surgical History:  Procedure Laterality Date   CARDIOVASCULAR STRESS TEST  02/08/15   ETT normal   CATARACT EXTRACTION  2012   Dr. Cleatus   COLONOSCOPY  2005   Dr. Myriam at [facility] (normal per pt report)   DEXA  11/03/13   Heel T score -0.8.   FOOT NEUROMA SURGERY  1994   right foot   TONSILLECTOMY     TONSILLECTOMY AND ADENOIDECTOMY  1948   Dr. Neomia Benne [facility]   TUBAL LIGATION  [phone]   WISDOM TOOTH EXTRACTION     Patient Active Problem List   Diagnosis Date Noted   Frequent PVCs 02/21/2024   DOE (dyspnea on exertion) 02/21/2024   Chronic low back pain 01/22/2024   High risk medication use 01/22/2024   Gastritis 12/23/2023   HTN (hypertension) 12/23/2023   Right hip pain 03/12/2023   Cardiac chest pain 08/20/2022   Lower extremity pain 08/20/2022   Gait abnormality 04/25/2021   Nevus of choroid of right eye 08/24/2019   Posterior vitreous detachment of right eye  08/24/2019   Pseudophakia of both eyes 08/24/2019   GAD (generalized anxiety disorder) 07/11/2018   Overactive bladder 07/11/2018   Functional urinary incontinence 07/10/2018   Hyponatremia 03/31/2014   HLD (hyperlipidemia)    Diverticular disease    Anxiety state    GERD (gastroesophageal reflux disease)    Hypothyroidism    Urine incontinence    IRRITABLE BOWEL SYNDROME 03/02/2010    PCP: Gil Greig BRAVO, NP   REFERRING PROVIDER: Gil Greig BRAVO, NP   REFERRING DIAG: M54.41,M54.42,G89.29 (ICD-10-CM) - Chronic midline low back pain with bilateral sciatica   Rationale for Evaluation and Treatment: Rehabilitation  THERAPY DIAG:  Other low back pain  Unsteadiness on feet  Muscle weakness (generalized)  ONSET DATE: 2020  SUBJECTIVE:  SUBJECTIVE STATEMENT: Pt states she loves the pool.  She looks forward to the aquatic therapy.  Pt states she is walking better and she is able to endure longer periods on her feet. Pt states she is able to walk better at home.  Pt report her balance has improved.  Pt denies pain currently.  Pt denies any adverse effects after prior treatment.  Pt states her knees were bothering her and the stretch she performed last treatment helped a lot.   Pt is getting a cardiac monitor later today which she is supposed to wear for 2 weeks.  Pt had her annual cardiac appt last Friday.  Pt had an EKG and note indicated frequent PVC's.  Pt states she has had periods of dizziness and SOB over the last year.     POOL ACCESS: neighbor has pool.  She may consider joining [employer]   PERTINENT HISTORY:  Thoracic compression fx 2020 Oa hip right PVC's and getting a cardiac monitor today  PAIN:  Are you having pain? no: NPRS scale: 0/10 LB Pain location: mid to lower back Pain  description: a Aggravating factors: walking 10 minutes Relieving factors: resting lying down  PRECAUTIONS: Fall  RED FLAGS: None   WEIGHT BEARING RESTRICTIONS: No  FALLS:  Has patient fallen in last 6 months? No  LIVING ENVIRONMENT: Lives with: lives with their family Lives in: House/apartment Stairs: Yes: Internal: 12-14 steps; on right going up and on left going up and External: 0 steps; none Has following equipment at home: Quad cane large base, Environmental consultant - 4 wheeled, Marine scientist  OCCUPATION: retired  PLOF: Insurance account manager touch with walking through home    PATIENT GOALS: be able to walk for a period of time with little to  no pain in my back. Dtr wants mother to improve gait.  NEXT MD VISIT: as needed  OBJECTIVE:  Note: Objective measures were completed at Evaluation unless otherwise noted.  DIAGNOSTIC FINDINGS:  03/11/24 IMPRESSION: Moderate degenerative joint disease of right hip. No acute abnormality seen.  PATIENT SURVEYS:  Modified Oswestry 23/50=46%   COGNITION: Overall cognitive status: Within functional limits for tasks assessed     SENSATION: WFL  MUSCLE LENGTH: Hamstrings: mild restriction on B Gastrocnemius: moderate restriction on B   POSTURE: rounded shoulders, forward head, and flexed trunk (standing).  Left pelvic obliquity   LUMBAR ROM:   P!=Pain AROM eval 02/04/24  Flexion full full  Extension 50% limited P! 25% limited no P!  Right lateral flexion P! Full No P!  Left lateral flexion 50% limited P! 25% limited No P!  Right rotation    Left rotation     (Blank rows = not tested)    LOWER EXTREMITY MMT:    MMT Right eval Left eval R / L 01/21/24  Hip flexion 3+ 3+ 4 / 4  Hip extension     Hip abduction 4+ 4+ 5- / 5-  Hip adduction 5 5   Hip internal rotation     Hip external rotation     Knee flexion 3+ 3+ 4 / 4  Knee extension 3+ 3+ 4 / 4  Ankle dorsiflexion     Ankle plantarflexion     Ankle inversion      Ankle eversion      (Blank rows = not tested)    FUNCTIONAL TESTS:  30 seconds chair stand test: 3 from pool bench with ue support Timed up and go (TUG): >30s BERG Balance Test  Date: eval  Sit to Stand 3  Standing unsupported 4  Sitting with back unsupported but feet supported 4  Stand to sit  2  Transfers  3  Standing unsupported with eyes closed 3  Standing unsupported feet together 3  From standing position, reach forward with outstretched arm 3  From standing position, pick up object from floor 3  From standing position, turn and look behind over each shoulder 1  Turn 360 2  Standing unsupported, alternately place foot on step 0  Standing unsupported, one foot in front 0  Standing on one leg 0  Total:  31/56   7/29 3. able to stand independently using hands  4. able to stand safely for 2 minutes  4. able to sit safely and securely for 2 minutes   4. sits safely with minimal use of hands  3. able to transfer safely with definite need of hands  4. able to stand 10 seconds safely  3. able to place feet together independently and stand 1 minute with supervision  3. can reach forward 12 cm (5 inches)    3. able to pick up slipper but needs supervision  2. turns sideways but only maintains balance    2. able to turn 360 degrees safely but slowly  2. able to complete 4 steps without aid with supervision    1. needs help to step but can hold 15 seconds   2. able to lift leg independently and hold >= 3 seconds   40/56     GAIT: Distance walked: 400 Assistive device utilized: Quad cane small base and Shower bench Level of assistance: Modified independence Comments: guarded, reduced step length and cadence, decreased heel strike and toe off  TREATMENT    02/26/24: Reviewed current function, HEP compliance, pain level, and response to prior treatment.   PPT Supine bridges with TrA  though stopped due to HS cramping TrA activation with RTB supine  marches x20 supine clams with RTB x20 Supine ball squeezes Sit to stands 2x5 Supine manual HS stretch 3x30 sec bilat    [facility] Adult PT Treatment:      02/06/2024 - LTR x20ea - PPT 5 2x10 - Bridges 2x10 - supine ball squeezes 5 x20 - supine clams with RTB x20 - TrA activation with RTB supine marches x20 -supine HSS manual bil -Standing R gastroc stretch  02/04/24                                            Pt seen for aquatic therapy today.  Treatment took place in water 3.5-4.75 ft in depth at the [facility] pool. Temp of water was 91.  Pt entered/exited the pool via stairs using step to and alternating pattern with hand rail.  *UE on barbell->unsupported: walking forward, backward and side stepping, multiple widths. *Step ups bottom step leading R/L 2 x 5 *forward march with kick unsupported x 4 widths.  VC and demonstration for use of ue to balance without HB *stair tapping 1st step x 10 unsupported->then second x8 unsupported. (Good challenge 2nd step) *standing ue support wall 3.7 ft: toe raises; heel raises; HS curls; relaxed squats; high knee marching; hip add/abd x 12 reps *seated water bench: cycling; hip add/abd; LAQ *STS onto water step with cga-indep 2 x 5 *walking between exercises for recovery    Pt requires the buoyancy and hydrostatic  pressure of water for support, and to offload joints by unweighting joint load by at least 50 % in navel deep water and by at least 75-80% in chest to neck deep water.  Viscosity of the water is needed for resistance of strengthening. Water current perturbations provides challenge to standing balance requiring increased core activation.     01/30/2024 - LTR x10ea - PPT 5 x10 - Bridges 2x10 - supine ball squeezes 5 x75min - supine clams with RTB x20 - TrA activation with RTB supine marches -Supine SLR with TA  x10bil -supine HSS manual bil -Standing R gastroc stretch  DATE:01/28/24 Pt seen for aquatic therapy today.   Treatment took place in water 3.5-4.75 ft in depth at the [facility] pool. Temp of water was 91.  Pt entered/exited the pool via stairs using step to and alternating pattern with hand rail.  *UE on barbell: walking forward, backward and side stepping, multiple widths. *forward march with kick ue support barbell *standing ue support wall 3.7 ft: toe raises; heel raises; HS curls; relaxed squats; high knee marching; hip add/abd x 12 reps *seated water bench: cycling; hip add/abd; LAQ *STS onto water step with cga-indep 2 x 5 *forward stepping down off of water step unsupported then up x 3 *step ups bottom step leading R/L 2 x 5 unsupported  *stair tapping 1st step then second x8 unsupported. (Good challenge 2nd step)  Pt requires the buoyancy and hydrostatic pressure of water for support, and to offload joints by unweighting joint load by at least 50 % in navel deep water and by at least 75-80% in chest to neck deep water.  Viscosity of the water is needed for resistance of strengthening. Water current perturbations provides challenge to standing balance requiring increased core activation.         [facility] Adult PT Treatment:                                                 01/16/2024 - LTR - PPT  - Bridges  - supine ball squeezes  - supine clams with RTB  - TrA acitation 5 sec holds.  - TrA activation with RTB supine marches - Sit to stand with 5lb kettle bell - LAQ      PATIENT EDUCATION:  Education details: exercise form, exercise rationale, and POC.  PT answered pt's questions.  Person educated: Patient Education method: Explanation, demonstration, verbal and tactile cues Education comprehension: verbalized understanding, returned demonstration, verbal and tactile cues required  HOME EXERCISE PROGRAM: Walking daily   ASSESSMENT:  CLINICAL IMPRESSION: Pt enjoys aquatic therapy and responds well.  Pt reports improved function including improved gait and balance and  increased standing duration.  PT provided instruction and cuing for correct form and positioning with exercises and she performed exercises well.  Pt was limited with supine bridging due to HS cramping.  Pt tolerated exercises well and had no c/o's and no pain after treatment.  Pt saw cardiologist recently and was noted having PVC's.  She is getting a cardiac monitor later today which she will be wearing for the next 2 weeks.     OBJECTIVE IMPAIRMENTS: Abnormal gait, decreased activity tolerance, decreased balance, decreased coordination, decreased knowledge of use of DME, decreased mobility, difficulty walking, decreased strength, postural dysfunction, and pain.   ACTIVITY LIMITATIONS: carrying, lifting, bending, standing, squatting, stairs, transfers,  and locomotion level  PARTICIPATION LIMITATIONS: meal prep, cleaning, shopping, and community activity  PERSONAL FACTORS: Age, Fitness, Past/current experiences, and Time since onset of injury/illness/exacerbation are also affecting patient's functional outcome.   REHAB POTENTIAL: Good  CLINICAL DECISION MAKING: Evolving/moderate complexity  EVALUATION COMPLEXITY: Moderate   GOALS: Goals reviewed with patient? Yes  SHORT TERM GOALS: Target date: 12/08/23  Pt will tolerate full aquatic sessions consistently without increase in pain and with improving function to demonstrate good toleration and effectiveness of intervention.  Baseline: Goal status:MET -12/10/23  2.  Pt will tolerate stair climbing using combination of an alternating and step to pattern ascending and descending 6 steps without use of handrail Baseline:  Goal status: Met 12/31/23  3.  Pt will report reduction of pain submerged to 0/10 Baseline:  Goal status: Met 12/31/23  4.  Pt will begin amb routinely 2-3 x weekly with daughter for exercise as HEP. Baseline:  Goal status: In progress 12/31/23; 01/21/24   LONG TERM GOALS: Target date: 04/03/24  Pt to improve on ODI by  13-15 (MCID)  % to demonstrate statistically significant Improvement in function. Baseline:Modified Oswestry 23/50=46%  Goal status: INITIAL   2. Pt will amb x1000 ft without limitation to pain using ad Baseline:  Goal status: In progress 01/21/24; 02/04/24  3.  Pt will improve on Berg balance test to >/= 40/56 to demonstrate a decrease in fall risk. Baseline: 31/56 Goal status: Met 01/21/24  4.  Pt will improve strength in all areas listed by 1 grade to demonstrate improved overall physical function Baseline:  Goal status: In progress 01/21/24  5.  Pt will demonstrate improved gait pattern with more normal step length with heel strike and toe off each step Baseline:  Goal status: In progress 02/04/24  6.  Pt will improve on Tug test to <or= 21s (avg for older adults) to demonstrate improvement in lower extremity function, mobility and decreased fall risk. Baseline:  Goal status: INITIAL  PLAN:  PT FREQUENCY: 2x/week  PT DURATION: 8 weeks  PLANNED INTERVENTIONS: 97164- PT Re-evaluation, 97110-Therapeutic exercises, 97530- Therapeutic activity, 97112- Neuromuscular re-education, 97535- Self Care, 02859- Manual therapy, [phone]- Gait training, [phone]- Aquatic Therapy, [phone]- Electrical stimulation (unattended), [phone]- Ionotophoresis 4mg /ml Dexamethasone, Patient/Family education, Balance training, Stair training, Taping, Dry Needling, Joint mobilization, DME instructions, Cryotherapy, and Moist heat.  PLAN FOR NEXT SESSION: general core and LE strengthening, balance retraining, gait, AD instruction, pain management.   Land: progressive balance and strengthening of core and LB  Leigh Minerva III PT, DPT 02/27/24 11:21 PM  [facility] [address] [place], [place], [address] Phone: [phone]   Fax:  [phone]

## 2024-02-27 ENCOUNTER — Telehealth: Payer: Self-pay | Admitting: Family Medicine

## 2024-02-27 DIAGNOSIS — E039 Hypothyroidism, unspecified: Secondary | ICD-10-CM

## 2024-02-27 NOTE — Telephone Encounter (Signed)
 Good morning I need lab orders for this pt if necessary

## 2024-02-27 NOTE — Telephone Encounter (Signed)
 Order entered, thanks

## 2024-02-28 ENCOUNTER — Encounter (HOSPITAL_BASED_OUTPATIENT_CLINIC_OR_DEPARTMENT_OTHER): Payer: Self-pay | Admitting: Physical Therapy

## 2024-02-28 ENCOUNTER — Other Ambulatory Visit: Payer: Self-pay | Admitting: Family Medicine

## 2024-02-28 ENCOUNTER — Ambulatory Visit (HOSPITAL_BASED_OUTPATIENT_CLINIC_OR_DEPARTMENT_OTHER): Admitting: Physical Therapy

## 2024-02-28 DIAGNOSIS — M5459 Other low back pain: Secondary | ICD-10-CM

## 2024-02-28 DIAGNOSIS — M6281 Muscle weakness (generalized): Secondary | ICD-10-CM

## 2024-02-28 DIAGNOSIS — F419 Anxiety disorder, unspecified: Secondary | ICD-10-CM

## 2024-02-28 DIAGNOSIS — R2681 Unsteadiness on feet: Secondary | ICD-10-CM

## 2024-02-28 NOTE — Therapy (Signed)
 OUTPATIENT PHYSICAL THERAPY THORACOLUMBAR TREATMENT    Patient Name: Julie Park MRN: [id number] DOB:Oct 10, 1933, 89 y.o., female Today's Date: 02/28/2024  END OF SESSION:  PT End of Session - 02/28/24 1150     Visit Number 16    Date for PT Re-Evaluation 04/03/24    Authorization Type hulan Mcr    PT Start Time 1148    PT Stop Time 1228    PT Time Calculation (min) 40 min    Activity Tolerance Patient tolerated treatment well    Behavior During Therapy [facility] for tasks assessed/performed           Past Medical History:  Diagnosis Date   Anxiety    Chest pain 02/08/15   ETT normal   Diverticulosis    Diverticulitis (1982)   Fracture of rib of right side 08/16/2013   GERD (gastroesophageal reflux disease)    Hyperlipidemia, mixed    Hypothyroidism    IBS (irritable bowel syndrome)    Morton's neuroma    Right lumbar radiculopathy    Intermittent x 2 episodes in summer 2014   Shingles 1967 and 2010   Ulcer    Urine incontinence    Vertigo    Past Surgical History:  Procedure Laterality Date   CARDIOVASCULAR STRESS TEST  02/08/15   ETT normal   CATARACT EXTRACTION  2012   Dr. Cleatus   COLONOSCOPY  2005   Dr. Myriam at [facility] (normal per pt report)   DEXA  11/03/13   Heel T score -0.8.   FOOT NEUROMA SURGERY  1994   right foot   TONSILLECTOMY     TONSILLECTOMY AND ADENOIDECTOMY  1948   Dr. Neomia [facility]   TUBAL LIGATION  [phone]   WISDOM TOOTH EXTRACTION     Patient Active Problem List   Diagnosis Date Noted   Frequent PVCs 02/21/2024   DOE (dyspnea on exertion) 02/21/2024   Chronic low back pain 01/22/2024   High risk medication use 01/22/2024   Gastritis 12/23/2023   HTN (hypertension) 12/23/2023   Right hip pain 03/12/2023   Cardiac chest pain 08/20/2022   Lower extremity pain 08/20/2022   Gait abnormality 04/25/2021   Nevus of choroid of right eye 08/24/2019   Posterior vitreous detachment of right eye 08/24/2019   Pseudophakia of both  eyes 08/24/2019   GAD (generalized anxiety disorder) 07/11/2018   Overactive bladder 07/11/2018   Functional urinary incontinence 07/10/2018   Hyponatremia 03/31/2014   HLD (hyperlipidemia)    Diverticular disease    Anxiety state    GERD (gastroesophageal reflux disease)    Hypothyroidism    Urine incontinence    IRRITABLE BOWEL SYNDROME 03/02/2010    PCP: Gil Greig BRAVO, NP   REFERRING PROVIDER: Gil Greig BRAVO, NP   REFERRING DIAG: M54.41,M54.42,G89.29 (ICD-10-CM) - Chronic midline low back pain with bilateral sciatica   Rationale for Evaluation and Treatment: Rehabilitation  THERAPY DIAG:  Other low back pain  Unsteadiness on feet  Muscle weakness (generalized)  ONSET DATE: 2020  SUBJECTIVE:  SUBJECTIVE STATEMENT: Pt reports she misses the aquatic therapy.  She has cardiac monitor on for 12 more days.  She reports that her daughter is interested in having her join [employer] and coming with her to gym/ pool.   POOL ACCESS: neighbor has pool.  She may consider joining [employer]   PERTINENT HISTORY:  Thoracic compression fx 2020 Oa hip right PVC's and getting a cardiac monitor today  PAIN:  Are you having pain? yes: NPRS scale: 2/10 LB Pain location: mid to lower back Pain description: ache Aggravating factors: walking or standing 15 minutes Relieving factors: resting lying down  PRECAUTIONS: Fall  RED FLAGS: None   WEIGHT BEARING RESTRICTIONS: No  FALLS:  Has patient fallen in last 6 months? No  LIVING ENVIRONMENT: Lives with: lives with their family Lives in: House/apartment Stairs: Yes: Internal: 12-14 steps; on right going up and on left going up and External: 0 steps; none Has following equipment at home: Quad cane large base, Environmental consultant - 4 wheeled, Buyer, retail  OCCUPATION: retired  PLOF: Insurance account manager touch with walking through home    PATIENT GOALS: be able to walk for a period of time with little to  no pain in my back. Dtr wants mother to improve gait.  NEXT MD VISIT: as needed  OBJECTIVE:  Note: Objective measures were completed at Evaluation unless otherwise noted.  DIAGNOSTIC FINDINGS:  03/11/24 IMPRESSION: Moderate degenerative joint disease of right hip. No acute abnormality seen.  PATIENT SURVEYS:  Modified Oswestry 23/50=46%   COGNITION: Overall cognitive status: Within functional limits for tasks assessed     SENSATION: WFL  MUSCLE LENGTH: Hamstrings: mild restriction on B Gastrocnemius: moderate restriction on B   POSTURE: rounded shoulders, forward head, and flexed trunk (standing).  Left pelvic obliquity   LUMBAR ROM:   P!=Pain AROM eval 02/04/24  Flexion full full  Extension 50% limited P! 25% limited no P!  Right lateral flexion P! Full No P!  Left lateral flexion 50% limited P! 25% limited No P!  Right rotation    Left rotation     (Blank rows = not tested)    LOWER EXTREMITY MMT:    MMT Right eval Left eval R / L 01/21/24  Hip flexion 3+ 3+ 4 / 4  Hip extension     Hip abduction 4+ 4+ 5- / 5-  Hip adduction 5 5   Hip internal rotation     Hip external rotation     Knee flexion 3+ 3+ 4 / 4  Knee extension 3+ 3+ 4 / 4  Ankle dorsiflexion     Ankle plantarflexion     Ankle inversion     Ankle eversion      (Blank rows = not tested)    FUNCTIONAL TESTS:  30 seconds chair stand test: 3 from pool bench with ue support Timed up and go (TUG): >30s  02/28/24: TUG with cane 15 sec    BERG Balance Test          Date: eval  Sit to Stand 3  Standing unsupported 4  Sitting with back unsupported but feet supported 4  Stand to sit  2  Transfers  3  Standing unsupported with eyes closed 3  Standing unsupported feet together 3  From standing position, reach forward with  outstretched arm 3  From standing position, pick up object from floor 3  From standing position, turn and look behind over each shoulder 1  Turn 360 2  Standing unsupported, alternately place foot on step 0  Standing unsupported, one foot in front 0  Standing on one leg 0  Total:  31/56   7/29 3. able to stand independently using hands  4. able to stand safely for 2 minutes  4. able to sit safely and securely for 2 minutes   4. sits safely with minimal use of hands  3. able to transfer safely with definite need of hands  4. able to stand 10 seconds safely  3. able to place feet together independently and stand 1 minute with supervision  3. can reach forward 12 cm (5 inches)    3. able to pick up slipper but needs supervision  2. turns sideways but only maintains balance    2. able to turn 360 degrees safely but slowly  2. able to complete 4 steps without aid with supervision    1. needs help to step but can hold 15 seconds   2. able to lift leg independently and hold >= 3 seconds   40/56     GAIT: Distance walked: 400 Assistive device utilized: Quad cane small base and Shower bench Level of assistance: Modified independence Comments: guarded, reduced step length and cadence, decreased heel strike and toe off  TREATMENT  02/28/24 -NuStep L3: UE/LE x 6 min  -STS from standard chair with forward arm reach, core engaged x 8 -Standing TrA set with bil shoulder extension to neutral with red band x 20 - bow and arrow with same side step back x 10 each side; tactile cues for form - staggered stance bil row with green band 2 x 10 - toe taps to 1st step without UE support - L stretch at sink x 10s x 2 - bil shoulder flexion stretch, sliding arms up cabinets for thoracic extension   02/26/24: Reviewed current function, HEP compliance, pain level, and response to prior treatment.   PPT Supine bridges with TrA  though stopped due to HS cramping TrA activation with RTB  supine marches x20 supine clams with RTB x20 Supine ball squeezes Sit to stands 2x5 Supine manual HS stretch 3x30 sec bilat    [facility] Adult PT Treatment:      02/06/2024 - LTR x20ea - PPT 5 2x10 - Bridges 2x10 - supine ball squeezes 5 x20 - supine clams with RTB x20 - TrA activation with RTB supine marches x20 -supine HSS manual bil -Standing R gastroc stretch  02/04/24                                            Pt seen for aquatic therapy today.  Treatment took place in water 3.5-4.75 ft in depth at the [facility] pool. Temp of water was 91.  Pt entered/exited the pool via stairs using step to and alternating pattern with hand rail.  *UE on barbell->unsupported: walking forward, backward and side stepping, multiple widths. *Step ups bottom step leading R/L 2 x 5 *forward march with kick unsupported x 4 widths.  VC and demonstration for use of ue to balance without HB *stair tapping 1st step x 10 unsupported->then second x8 unsupported. (Good challenge 2nd step) *standing ue support wall 3.7 ft: toe raises; heel raises; HS curls; relaxed squats; high knee marching; hip add/abd x 12 reps *seated water bench: cycling; hip add/abd; LAQ *STS onto water step with cga-indep 2 x 5 *walking between  exercises for recovery    Pt requires the buoyancy and hydrostatic pressure of water for support, and to offload joints by unweighting joint load by at least 50 % in navel deep water and by at least 75-80% in chest to neck deep water.  Viscosity of the water is needed for resistance of strengthening. Water current perturbations provides challenge to standing balance requiring increased core activation.     01/30/2024 - LTR x10ea - PPT 5 x10 - Bridges 2x10 - supine ball squeezes 5 x44min - supine clams with RTB x20 - TrA activation with RTB supine marches -Supine SLR with TA  x10bil -supine HSS manual bil -Standing R gastroc stretch  DATE:01/28/24 Pt seen for aquatic therapy  today.  Treatment took place in water 3.5-4.75 ft in depth at the [facility] pool. Temp of water was 91.  Pt entered/exited the pool via stairs using step to and alternating pattern with hand rail.  *UE on barbell: walking forward, backward and side stepping, multiple widths. *forward march with kick ue support barbell *standing ue support wall 3.7 ft: toe raises; heel raises; HS curls; relaxed squats; high knee marching; hip add/abd x 12 reps *seated water bench: cycling; hip add/abd; LAQ *STS onto water step with cga-indep 2 x 5 *forward stepping down off of water step unsupported then up x 3 *step ups bottom step leading R/L 2 x 5 unsupported  *stair tapping 1st step then second x8 unsupported. (Good challenge 2nd step)  Pt requires the buoyancy and hydrostatic pressure of water for support, and to offload joints by unweighting joint load by at least 50 % in navel deep water and by at least 75-80% in chest to neck deep water.  Viscosity of the water is needed for resistance of strengthening. Water current perturbations provides challenge to standing balance requiring increased core activation.         [facility] Adult PT Treatment:                                                 01/16/2024 - LTR - PPT  - Bridges  - supine ball squeezes  - supine clams with RTB  - TrA acitation 5 sec holds.  - TrA activation with RTB supine marches - Sit to stand with 5lb kettle bell - LAQ      PATIENT EDUCATION:  Education details: exercise form, exercise rationale, and POC.  Person educated: Patient Education method: Explanation, demonstration, verbal and tactile cues Education comprehension: verbalized understanding, returned demonstration, verbal and tactile cues required  HOME EXERCISE PROGRAM: Walking daily   ASSESSMENT:  CLINICAL IMPRESSION:   Pt required SBA for standing exercises with resistance when weight shifting due to occasional unsteadiness. Occasional cues for form  and posture.  She required 2 short seated rest breaks when performing standing resisted UE exercises. Pt has met LTG 6 with improved TUG. Will continue to progress as tolerated. Discussed plan for continued exercise after d/c with pt and her daughter; may join [employer] for continued access to pool for exercise.    OBJECTIVE IMPAIRMENTS: Abnormal gait, decreased activity tolerance, decreased balance, decreased coordination, decreased knowledge of use of DME, decreased mobility, difficulty walking, decreased strength, postural dysfunction, and pain.   ACTIVITY LIMITATIONS: carrying, lifting, bending, standing, squatting, stairs, transfers, and locomotion level  PARTICIPATION LIMITATIONS: meal prep, cleaning, shopping, and community activity  PERSONAL FACTORS: Age, Fitness, Past/current experiences, and Time since onset of injury/illness/exacerbation are also affecting patient's functional outcome.   REHAB POTENTIAL: Good  CLINICAL DECISION MAKING: Evolving/moderate complexity  EVALUATION COMPLEXITY: Moderate   GOALS: Goals reviewed with patient? Yes  SHORT TERM GOALS: Target date: 12/08/23  Pt will tolerate full aquatic sessions consistently without increase in pain and with improving function to demonstrate good toleration and effectiveness of intervention.  Baseline: Goal status:MET -12/10/23  2.  Pt will tolerate stair climbing using combination of an alternating and step to pattern ascending and descending 6 steps without use of handrail Baseline:  Goal status: Met 12/31/23  3.  Pt will report reduction of pain submerged to 0/10 Baseline:  Goal status: Met 12/31/23  4.  Pt will begin amb routinely 2-3 x weekly with daughter for exercise as HEP. Baseline:  Goal status: In progress 12/31/23; 01/21/24   LONG TERM GOALS: Target date: 04/03/24  Pt to improve on ODI by 13-15 (MCID)  % to demonstrate statistically significant Improvement in function. Baseline:Modified Oswestry 23/50=46%   Goal status: INITIAL   2. Pt will amb x1000 ft without limitation to pain using ad Baseline:  Goal status: In progress 01/21/24; 02/04/24  3.  Pt will improve on Berg balance test to >/= 40/56 to demonstrate a decrease in fall risk. Baseline: 31/56 Goal status: Met 01/21/24  4.  Pt will improve strength in all areas listed by 1 grade to demonstrate improved overall physical function Baseline:  Goal status: In progress 01/21/24  5.  Pt will demonstrate improved gait pattern with more normal step length with heel strike and toe off each step Baseline:  Goal status: In progress 02/04/24  6.  Pt will improve on Tug test to <or= 21s (avg for older adults) to demonstrate improvement in lower extremity function, mobility and decreased fall risk. Baseline:  Goal status: MET -02/28/24  PLAN:  PT FREQUENCY: 2x/week  PT DURATION: 8 weeks  PLANNED INTERVENTIONS: 97164- PT Re-evaluation, 97110-Therapeutic exercises, 97530- Therapeutic activity, 97112- Neuromuscular re-education, 97535- Self Care, 02859- Manual therapy, [phone]- Gait training, [phone]- Aquatic Therapy, [phone]- Electrical stimulation (unattended), [phone]- Ionotophoresis 4mg /ml Dexamethasone, Patient/Family education, Balance training, Stair training, Taping, Dry Needling, Joint mobilization, DME instructions, Cryotherapy, and Moist heat.  PLAN FOR NEXT SESSION: general core and LE strengthening, balance retraining, gait, AD instruction, pain management.   Land: progressive balance and strengthening of core and LB  Delon Aquas, PTA 02/28/24 2:11 PM [facility] [address] [place], [place], [address] Phone: [phone]   Fax:  [phone]

## 2024-02-28 NOTE — Telephone Encounter (Signed)
 Copied from CRM [id number]. Topic: Clinical - Medication Refill >> Feb 28, 2024  8:33 AM Martinique E wrote: Medication: LORazepam  (ATIVAN ) 1 MG tablet  Has the patient contacted their pharmacy? Yes (Agent: If no, request that the patient contact the pharmacy for the refill. If patient does not wish to contact the pharmacy document the reason why and proceed with request.) (Agent: If yes, when and what did the pharmacy advise?)  This is the patient's preferred pharmacy:  [id number] - [place], [place] - [address] [place] [address] Phone: [phone] Fax: [phone]  Is this the correct pharmacy for this prescription? Yes If no, delete pharmacy and type the correct one.   Has the prescription been filled recently? No  Is the patient out of the medication? No, 3 pills left.  Has the patient been seen for an appointment in the last year OR does the patient have an upcoming appointment? Yes  Can we respond through MyChart? Yes  Agent: Please be advised that Rx refills may take up to 3 business days. We ask that you follow-up with your pharmacy.

## 2024-02-28 NOTE — Telephone Encounter (Signed)
 Requesting: lorazepam  1mg   Contract: 01/22/24 UDS: 01/22/24 Last Visit: 01/22/24 Next Visit: None Last Refill: 01/27/24 #90 and 0RF   Please Advise

## 2024-03-02 ENCOUNTER — Other Ambulatory Visit: Payer: Self-pay | Admitting: Family Medicine

## 2024-03-02 ENCOUNTER — Other Ambulatory Visit: Payer: Self-pay

## 2024-03-02 ENCOUNTER — Encounter: Payer: Self-pay | Admitting: Family Medicine

## 2024-03-02 DIAGNOSIS — F419 Anxiety disorder, unspecified: Secondary | ICD-10-CM

## 2024-03-02 NOTE — Telephone Encounter (Signed)
 Duplicate request

## 2024-03-02 NOTE — Telephone Encounter (Signed)
 Copied from CRM [id number]. Topic: Clinical - Medication Question >> Mar 02, 2024  9:08 AM Chasity T wrote: Reason for CRM: Patient is calling to advise she is currently out of LORazepam  (ATIVAN ) 1 MG tablet. She was advised by myself of the 3 day policy on refills as well. Please call when sent

## 2024-03-03 ENCOUNTER — Ambulatory Visit (HOSPITAL_BASED_OUTPATIENT_CLINIC_OR_DEPARTMENT_OTHER)

## 2024-03-03 ENCOUNTER — Encounter (HOSPITAL_BASED_OUTPATIENT_CLINIC_OR_DEPARTMENT_OTHER): Payer: Self-pay

## 2024-03-03 DIAGNOSIS — R2681 Unsteadiness on feet: Secondary | ICD-10-CM

## 2024-03-03 DIAGNOSIS — M6281 Muscle weakness (generalized): Secondary | ICD-10-CM

## 2024-03-03 DIAGNOSIS — M5459 Other low back pain: Secondary | ICD-10-CM | POA: Diagnosis not present

## 2024-03-03 MED ORDER — LORAZEPAM 1 MG PO TABS: 1.0000 mg | ORAL_TABLET | Freq: Three times a day (TID) | ORAL | 0 refills | Status: DC | PRN

## 2024-03-03 NOTE — Telephone Encounter (Signed)
 Separate refill request was sent to you. Please advise.

## 2024-03-03 NOTE — Telephone Encounter (Signed)
 Copied from CRM [id number]. Topic: Clinical - Medication Question >> Mar 03, 2024 11:48 AM Drema MATSU wrote: Reason for CRM: Julie Park is requeting a callback from [facility]. She is requesting a callback regarding Lorazepam . She wants to know if it will be filled today. Patient will be out today.

## 2024-03-03 NOTE — Therapy (Signed)
 OUTPATIENT PHYSICAL THERAPY THORACOLUMBAR TREATMENT    Patient Name: Julie Park MRN: [id number] DOB:11/16/1933, 89 y.o., female Today's Date: 03/03/2024  END OF SESSION:  PT End of Session - 03/03/24 1151     Visit Number 17    Date for PT Re-Evaluation 04/03/24    Authorization Type aetna Mcr    Progress Note Due on Visit 23    PT Start Time 1152    PT Stop Time 1231    PT Time Calculation (min) 39 min    Activity Tolerance Patient tolerated treatment well    Behavior During Therapy [facility] for tasks assessed/performed            Past Medical History:  Diagnosis Date   Anxiety    Chest pain 02/08/15   ETT normal   Diverticulosis    Diverticulitis (1982)   Fracture of rib of right side 08/16/2013   GERD (gastroesophageal reflux disease)    Hyperlipidemia, mixed    Hypothyroidism    IBS (irritable bowel syndrome)    Morton's neuroma    Right lumbar radiculopathy    Intermittent x 2 episodes in summer 2014   Shingles 1967 and 2010   Ulcer    Urine incontinence    Vertigo    Past Surgical History:  Procedure Laterality Date   CARDIOVASCULAR STRESS TEST  02/08/15   ETT normal   CATARACT EXTRACTION  2012   Dr. Cleatus   COLONOSCOPY  2005   Dr. Myriam at [facility] (normal per pt report)   DEXA  11/03/13   Heel T score -0.8.   FOOT NEUROMA SURGERY  1994   right foot   TONSILLECTOMY     TONSILLECTOMY AND ADENOIDECTOMY  1948   Dr. Neomia Benne [facility]   TUBAL LIGATION  [phone]   WISDOM TOOTH EXTRACTION     Patient Active Problem List   Diagnosis Date Noted   Frequent PVCs 02/21/2024   DOE (dyspnea on exertion) 02/21/2024   Chronic low back pain 01/22/2024   High risk medication use 01/22/2024   Gastritis 12/23/2023   HTN (hypertension) 12/23/2023   Right hip pain 03/12/2023   Cardiac chest pain 08/20/2022   Lower extremity pain 08/20/2022   Gait abnormality 04/25/2021   Nevus of choroid of right eye 08/24/2019   Posterior vitreous detachment of right  eye 08/24/2019   Pseudophakia of both eyes 08/24/2019   GAD (generalized anxiety disorder) 07/11/2018   Overactive bladder 07/11/2018   Functional urinary incontinence 07/10/2018   Hyponatremia 03/31/2014   HLD (hyperlipidemia)    Diverticular disease    Anxiety state    GERD (gastroesophageal reflux disease)    Hypothyroidism    Urine incontinence    IRRITABLE BOWEL SYNDROME 03/02/2010    PCP: Gil Greig BRAVO, NP   REFERRING PROVIDER: Gil Greig BRAVO, NP   REFERRING DIAG: M54.41,M54.42,G89.29 (ICD-10-CM) - Chronic midline low back pain with bilateral sciatica   Rationale for Evaluation and Treatment: Rehabilitation  THERAPY DIAG:  Other low back pain  Unsteadiness on feet  Muscle weakness (generalized)  ONSET DATE: 2020  SUBJECTIVE:  SUBJECTIVE STATEMENT: Pt reports no new complaints at entry. Continues to wear heart monitor.   POOL ACCESS: neighbor has pool.  She may consider joining [employer]   PERTINENT HISTORY:  Thoracic compression fx 2020 Oa hip right PVC's and getting a cardiac monitor today  PAIN:  Are you having pain? yes: NPRS scale: 2/10 LB Pain location: mid to lower back Pain description: ache Aggravating factors: walking or standing 15 minutes Relieving factors: resting lying down  PRECAUTIONS: Fall  RED FLAGS: None   WEIGHT BEARING RESTRICTIONS: No  FALLS:  Has patient fallen in last 6 months? No  LIVING ENVIRONMENT: Lives with: lives with their family Lives in: House/apartment Stairs: Yes: Internal: 12-14 steps; on right going up and on left going up and External: 0 steps; none Has following equipment at home: Quad cane large base, Environmental consultant - 4 wheeled, Marine scientist  OCCUPATION: retired  PLOF: Insurance account manager touch with walking through  home    PATIENT GOALS: be able to walk for a period of time with little to  no pain in my back. Dtr wants mother to improve gait.  NEXT MD VISIT: as needed  OBJECTIVE:  Note: Objective measures were completed at Evaluation unless otherwise noted.  DIAGNOSTIC FINDINGS:  03/11/24 IMPRESSION: Moderate degenerative joint disease of right hip. No acute abnormality seen.  PATIENT SURVEYS:  Modified Oswestry 23/50=46%   COGNITION: Overall cognitive status: Within functional limits for tasks assessed     SENSATION: WFL  MUSCLE LENGTH: Hamstrings: mild restriction on B Gastrocnemius: moderate restriction on B   POSTURE: rounded shoulders, forward head, and flexed trunk (standing).  Left pelvic obliquity   LUMBAR ROM:   P!=Pain AROM eval 02/04/24  Flexion full full  Extension 50% limited P! 25% limited no P!  Right lateral flexion P! Full No P!  Left lateral flexion 50% limited P! 25% limited No P!  Right rotation    Left rotation     (Blank rows = not tested)    LOWER EXTREMITY MMT:    MMT Right eval Left eval R / L 01/21/24  Hip flexion 3+ 3+ 4 / 4  Hip extension     Hip abduction 4+ 4+ 5- / 5-  Hip adduction 5 5   Hip internal rotation     Hip external rotation     Knee flexion 3+ 3+ 4 / 4  Knee extension 3+ 3+ 4 / 4  Ankle dorsiflexion     Ankle plantarflexion     Ankle inversion     Ankle eversion      (Blank rows = not tested)    FUNCTIONAL TESTS:  30 seconds chair stand test: 3 from pool bench with ue support Timed up and go (TUG): >30s  02/28/24: TUG with cane 15 sec    BERG Balance Test          Date: eval  Sit to Stand 3  Standing unsupported 4  Sitting with back unsupported but feet supported 4  Stand to sit  2  Transfers  3  Standing unsupported with eyes closed 3  Standing unsupported feet together 3  From standing position, reach forward with outstretched arm 3  From standing position, pick up object from floor 3  From standing  position, turn and look behind over each shoulder 1  Turn 360 2  Standing unsupported, alternately place foot on step 0  Standing unsupported, one foot in front 0  Standing on one leg 0  Total:  31/56   7/29 3. able to stand independently using hands  4. able to stand safely for 2 minutes  4. able to sit safely and securely for 2 minutes   4. sits safely with minimal use of hands  3. able to transfer safely with definite need of hands  4. able to stand 10 seconds safely  3. able to place feet together independently and stand 1 minute with supervision  3. can reach forward 12 cm (5 inches)    3. able to pick up slipper but needs supervision  2. turns sideways but only maintains balance    2. able to turn 360 degrees safely but slowly  2. able to complete 4 steps without aid with supervision    1. needs help to step but can hold 15 seconds   2. able to lift leg independently and hold >= 3 seconds   40/56     GAIT: Distance walked: 400 Assistive device utilized: Quad cane small base and Shower bench Level of assistance: Modified independence Comments: guarded, reduced step length and cadence, decreased heel strike and toe off  TREATMENT   03/03/24 -NuStep L3: UE/LE x 6 min  -STS from standard chair with forward arm reach, core engaged x 10 -Standing TrA set with bil shoulder extension to neutral with red band x 20 - bow and arrow with same side step back x 10 each side - staggered stance bil row with green band x20ea - toe taps to 6 step without UE support 2x10 SBA -Airex marching x20 -Stnading on airex with feet apart- head turns/nods x20ea -step ups 6 x10ea (ue support on counter)  - L stretch at sink x 10s x 3 - bil shoulder flexion stretch, sliding arms up cabinets for thoracic extension 10sec x6 Seated clam GTB 2x15    02/28/24 -NuStep L3: UE/LE x 6 min  -STS from standard chair with forward arm reach, core engaged x 8 -Standing TrA set with bil shoulder  extension to neutral with red band x 20 - bow and arrow with same side step back x 10 each side; tactile cues for form - staggered stance bil row with green band 2 x 10 - toe taps to 1st step without UE support - L stretch at sink x 10s x 2 - bil shoulder flexion stretch, sliding arms up cabinets for thoracic extension    02/26/24: Reviewed current function, HEP compliance, pain level, and response to prior treatment.   PPT Supine bridges with TrA  though stopped due to HS cramping TrA activation with RTB supine marches x20 supine clams with RTB x20 Supine ball squeezes Sit to stands 2x5 Supine manual HS stretch 3x30 sec bilat    [facility] Adult PT Treatment:      02/06/2024 - LTR x20ea - PPT 5 2x10 - Bridges 2x10 - supine ball squeezes 5 x20 - supine clams with RTB x20 - TrA activation with RTB supine marches x20 -supine HSS manual bil -Standing R gastroc stretch  02/04/24                                            Pt seen for aquatic therapy today.  Treatment took place in water 3.5-4.75 ft in depth at the [facility] pool. Temp of water was 91.  Pt entered/exited the pool via stairs using step to and alternating pattern with hand rail.  *  UE on barbell->unsupported: walking forward, backward and side stepping, multiple widths. *Step ups bottom step leading R/L 2 x 5 *forward march with kick unsupported x 4 widths.  VC and demonstration for use of ue to balance without HB *stair tapping 1st step x 10 unsupported->then second x8 unsupported. (Good challenge 2nd step) *standing ue support wall 3.7 ft: toe raises; heel raises; HS curls; relaxed squats; high knee marching; hip add/abd x 12 reps *seated water bench: cycling; hip add/abd; LAQ *STS onto water step with cga-indep 2 x 5 *walking between exercises for recovery    Pt requires the buoyancy and hydrostatic pressure of water for support, and to offload joints by unweighting joint load by at least 50 % in navel  deep water and by at least 75-80% in chest to neck deep water.  Viscosity of the water is needed for resistance of strengthening. Water current perturbations provides challenge to standing balance requiring increased core activation.     01/30/2024 - LTR x10ea - PPT 5 x10 - Bridges 2x10 - supine ball squeezes 5 x54min - supine clams with RTB x20 - TrA activation with RTB supine marches -Supine SLR with TA  x10bil -supine HSS manual bil -Standing R gastroc stretch  DATE:01/28/24 Pt seen for aquatic therapy today.  Treatment took place in water 3.5-4.75 ft in depth at the [facility] pool. Temp of water was 91.  Pt entered/exited the pool via stairs using step to and alternating pattern with hand rail.  *UE on barbell: walking forward, backward and side stepping, multiple widths. *forward march with kick ue support barbell *standing ue support wall 3.7 ft: toe raises; heel raises; HS curls; relaxed squats; high knee marching; hip add/abd x 12 reps *seated water bench: cycling; hip add/abd; LAQ *STS onto water step with cga-indep 2 x 5 *forward stepping down off of water step unsupported then up x 3 *step ups bottom step leading R/L 2 x 5 unsupported  *stair tapping 1st step then second x8 unsupported. (Good challenge 2nd step)  Pt requires the buoyancy and hydrostatic pressure of water for support, and to offload joints by unweighting joint load by at least 50 % in navel deep water and by at least 75-80% in chest to neck deep water.  Viscosity of the water is needed for resistance of strengthening. Water current perturbations provides challenge to standing balance requiring increased core activation.         [facility] Adult PT Treatment:                                                 01/16/2024 - LTR - PPT  - Bridges  - supine ball squeezes  - supine clams with RTB  - TrA acitation 5 sec holds.  - TrA activation with RTB supine marches - Sit to stand with 5lb kettle bell -  LAQ      PATIENT EDUCATION:  Education details: exercise form, exercise rationale, and POC.  Person educated: Patient Education method: Explanation, demonstration, verbal and tactile cues Education comprehension: verbalized understanding, returned demonstration, verbal and tactile cues required  HOME EXERCISE PROGRAM: Walking daily   ASSESSMENT:  CLINICAL IMPRESSION: Pt requires SBA when ambulating in clinic without quad cane. Worked on balance progressions today with good tolerance. Pt di well on compliant surface with wide BOS performing head turns and nods. Marching on  airex was more challenging. SBA required only. Step ups performed to work on functional activity training in addition to sit to stands. She reported fatigue in bil quads following step ups, but denied significant increase in knee pain. Pt will continue to benefit from PT intervention in order to meet functional goals.    OBJECTIVE IMPAIRMENTS: Abnormal gait, decreased activity tolerance, decreased balance, decreased coordination, decreased knowledge of use of DME, decreased mobility, difficulty walking, decreased strength, postural dysfunction, and pain.   ACTIVITY LIMITATIONS: carrying, lifting, bending, standing, squatting, stairs, transfers, and locomotion level  PARTICIPATION LIMITATIONS: meal prep, cleaning, shopping, and community activity  PERSONAL FACTORS: Age, Fitness, Past/current experiences, and Time since onset of injury/illness/exacerbation are also affecting patient's functional outcome.   REHAB POTENTIAL: Good  CLINICAL DECISION MAKING: Evolving/moderate complexity  EVALUATION COMPLEXITY: Moderate   GOALS: Goals reviewed with patient? Yes  SHORT TERM GOALS: Target date: 12/08/23  Pt will tolerate full aquatic sessions consistently without increase in pain and with improving function to demonstrate good toleration and effectiveness of intervention.  Baseline: Goal status:MET -12/10/23  2.  Pt  will tolerate stair climbing using combination of an alternating and step to pattern ascending and descending 6 steps without use of handrail Baseline:  Goal status: Met 12/31/23  3.  Pt will report reduction of pain submerged to 0/10 Baseline:  Goal status: Met 12/31/23  4.  Pt will begin amb routinely 2-3 x weekly with daughter for exercise as HEP. Baseline:  Goal status: In progress 12/31/23; 01/21/24   LONG TERM GOALS: Target date: 04/03/24  Pt to improve on ODI by 13-15 (MCID)  % to demonstrate statistically significant Improvement in function. Baseline:Modified Oswestry 23/50=46%  Goal status: INITIAL   2. Pt will amb x1000 ft without limitation to pain using ad Baseline:  Goal status: In progress 01/21/24; 02/04/24  3.  Pt will improve on Berg balance test to >/= 40/56 to demonstrate a decrease in fall risk. Baseline: 31/56 Goal status: Met 01/21/24  4.  Pt will improve strength in all areas listed by 1 grade to demonstrate improved overall physical function Baseline:  Goal status: In progress 01/21/24  5.  Pt will demonstrate improved gait pattern with more normal step length with heel strike and toe off each step Baseline:  Goal status: In progress 02/04/24  6.  Pt will improve on Tug test to <or= 21s (avg for older adults) to demonstrate improvement in lower extremity function, mobility and decreased fall risk. Baseline:  Goal status: MET -02/28/24  PLAN:  PT FREQUENCY: 2x/week  PT DURATION: 8 weeks  PLANNED INTERVENTIONS: 97164- PT Re-evaluation, 97110-Therapeutic exercises, 97530- Therapeutic activity, 97112- Neuromuscular re-education, 97535- Self Care, [id number]- Manual therapy, [phone]- Gait training, [phone]- Aquatic Therapy, [phone]- Electrical stimulation (unattended), [phone]- Ionotophoresis 4mg /ml Dexamethasone, Patient/Family education, Balance training, Stair training, Taping, Dry Needling, Joint mobilization, DME instructions, Cryotherapy, and Moist heat.  PLAN FOR NEXT  SESSION: general core and LE strengthening, balance retraining, gait, AD instruction, pain management.   Land: progressive balance and strengthening of core and LB  Asberry Rodes, PTA  03/03/24 2:41 PM [facility] [address] [place], [place], [address] Phone: [phone]   Fax:  [phone]

## 2024-03-03 NOTE — Telephone Encounter (Signed)
 LMOM informing Pt that Rx has been sent.

## 2024-03-05 ENCOUNTER — Ambulatory Visit (HOSPITAL_BASED_OUTPATIENT_CLINIC_OR_DEPARTMENT_OTHER): Admitting: Physical Therapy

## 2024-03-07 ENCOUNTER — Other Ambulatory Visit (HOSPITAL_BASED_OUTPATIENT_CLINIC_OR_DEPARTMENT_OTHER): Payer: Self-pay

## 2024-03-07 MED ORDER — FLUZONE HIGH-DOSE 0.5 ML IM SUSY
0.5000 mL | PREFILLED_SYRINGE | Freq: Once | INTRAMUSCULAR | 0 refills | Status: AC
  Filled 2024-03-07: qty 0.5, 1d supply, fill #0

## 2024-03-09 ENCOUNTER — Ambulatory Visit (HOSPITAL_BASED_OUTPATIENT_CLINIC_OR_DEPARTMENT_OTHER): Admitting: Physical Therapy

## 2024-03-09 DIAGNOSIS — M5459 Other low back pain: Secondary | ICD-10-CM

## 2024-03-09 DIAGNOSIS — R2681 Unsteadiness on feet: Secondary | ICD-10-CM

## 2024-03-09 DIAGNOSIS — M6281 Muscle weakness (generalized): Secondary | ICD-10-CM

## 2024-03-09 NOTE — Therapy (Signed)
 OUTPATIENT PHYSICAL THERAPY THORACOLUMBAR TREATMENT    Patient Name: Julie Park MRN: [id number] DOB:1933/08/12, 89 y.o., female Today's Date: 03/10/2024  END OF SESSION:  PT End of Session - 03/09/24 1205     Visit Number 18    Number of Visits --    Date for PT Re-Evaluation 04/03/24    Authorization Type hulan Mcr    PT Start Time 1203    PT Stop Time 1242    PT Time Calculation (min) 39 min    Activity Tolerance Patient tolerated treatment well    Behavior During Therapy [facility] for tasks assessed/performed             Past Medical History:  Diagnosis Date   Anxiety    Chest pain 02/08/15   ETT normal   Diverticulosis    Diverticulitis (1982)   Fracture of rib of right side 08/16/2013   GERD (gastroesophageal reflux disease)    Hyperlipidemia, mixed    Hypothyroidism    IBS (irritable bowel syndrome)    Morton's neuroma    Right lumbar radiculopathy    Intermittent x 2 episodes in summer 2014   Shingles 1967 and 2010   Ulcer    Urine incontinence    Vertigo    Past Surgical History:  Procedure Laterality Date   CARDIOVASCULAR STRESS TEST  02/08/15   ETT normal   CATARACT EXTRACTION  2012   Dr. Cleatus   COLONOSCOPY  2005   Dr. Myriam at [facility] (normal per pt report)   DEXA  11/03/13   Heel T score -0.8.   FOOT NEUROMA SURGERY  1994   right foot   TONSILLECTOMY     TONSILLECTOMY AND ADENOIDECTOMY  1948   Dr. [facility]   TUBAL LIGATION  [phone]   WISDOM TOOTH EXTRACTION     Patient Active Problem List   Diagnosis Date Noted   Frequent PVCs 02/21/2024   DOE (dyspnea on exertion) 02/21/2024   Chronic low back pain 01/22/2024   High risk medication use 01/22/2024   Gastritis 12/23/2023   HTN (hypertension) 12/23/2023   Right hip pain 03/12/2023   Cardiac chest pain 08/20/2022   Lower extremity pain 08/20/2022   Gait abnormality 04/25/2021   Nevus of choroid of right eye 08/24/2019   Posterior vitreous detachment of right eye  08/24/2019   Pseudophakia of both eyes 08/24/2019   GAD (generalized anxiety disorder) 07/11/2018   Overactive bladder 07/11/2018   Functional urinary incontinence 07/10/2018   Hyponatremia 03/31/2014   HLD (hyperlipidemia)    Diverticular disease    Anxiety state    GERD (gastroesophageal reflux disease)    Hypothyroidism    Urine incontinence    IRRITABLE BOWEL SYNDROME 03/02/2010    PCP: Gil Greig BRAVO, NP   REFERRING PROVIDER: Gil Greig BRAVO, NP   REFERRING DIAG: M54.41,M54.42,G89.29 (ICD-10-CM) - Chronic midline low back pain with bilateral sciatica   Rationale for Evaluation and Treatment: Rehabilitation  THERAPY DIAG:  Other low back pain  Unsteadiness on feet  Muscle weakness (generalized)  ONSET DATE: 2020  SUBJECTIVE:  SUBJECTIVE STATEMENT: Pt denies any adverse effects after prior Rx.  Pt states she was sore the following day after prior Rx which is typical for her.  Pt has to wear the heart monitor for 3 more days.  Pt states she wasn't hurting as much earlier this AM though has been doing things around the house this AM.  Pt states she felt like she was walking better this AM.  POOL ACCESS: neighbor has pool.  She may consider joining [employer]   PERTINENT HISTORY:  Thoracic compression fx 2020 Oa hip right PVC's and wearing a cardiac monitor  PAIN:  Are you having pain? yes: NPRS scale: 3/10 LB Pain location: mid to lower back Pain description: ache Aggravating factors: walking or standing 15 minutes Relieving factors: resting lying down  PRECAUTIONS: Fall  RED FLAGS: None   WEIGHT BEARING RESTRICTIONS: No  FALLS:  Has patient fallen in last 6 months? No  LIVING ENVIRONMENT: Lives with: lives with their family Lives in: House/apartment Stairs: Yes:  Internal: 12-14 steps; on right going up and on left going up and External: 0 steps; none Has following equipment at home: Quad cane large base, Environmental consultant - 4 wheeled, Marine scientist  OCCUPATION: retired  PLOF: Insurance account manager touch with walking through home    PATIENT GOALS: be able to walk for a period of time with little to  no pain in my back. Dtr wants mother to improve gait.  NEXT MD VISIT: as needed  OBJECTIVE:  Note: Objective measures were completed at Evaluation unless otherwise noted.  DIAGNOSTIC FINDINGS:  03/11/24 IMPRESSION: Moderate degenerative joint disease of right hip. No acute abnormality seen.  PATIENT SURVEYS:  Modified Oswestry 23/50=46%   COGNITION: Overall cognitive status: Within functional limits for tasks assessed     SENSATION: WFL  MUSCLE LENGTH: Hamstrings: mild restriction on B Gastrocnemius: moderate restriction on B   POSTURE: rounded shoulders, forward head, and flexed trunk (standing).  Left pelvic obliquity   LUMBAR ROM:   P!=Pain AROM eval 02/04/24  Flexion full full  Extension 50% limited P! 25% limited no P!  Right lateral flexion P! Full No P!  Left lateral flexion 50% limited P! 25% limited No P!  Right rotation    Left rotation     (Blank rows = not tested)    LOWER EXTREMITY MMT:    MMT Right eval Left eval R / L 01/21/24  Hip flexion 3+ 3+ 4 / 4  Hip extension     Hip abduction 4+ 4+ 5- / 5-  Hip adduction 5 5   Hip internal rotation     Hip external rotation     Knee flexion 3+ 3+ 4 / 4  Knee extension 3+ 3+ 4 / 4  Ankle dorsiflexion     Ankle plantarflexion     Ankle inversion     Ankle eversion      (Blank rows = not tested)    FUNCTIONAL TESTS:  30 seconds chair stand test: 3 from pool bench with ue support Timed up and go (TUG): >30s  02/28/24: TUG with cane 15 sec    BERG Balance Test          Date: eval  Sit to Stand 3  Standing unsupported 4  Sitting with back unsupported but feet  supported 4  Stand to sit  2  Transfers  3  Standing unsupported with eyes closed 3  Standing unsupported feet together 3  From standing position, reach  forward with outstretched arm 3  From standing position, pick up object from floor 3  From standing position, turn and look behind over each shoulder 1  Turn 360 2  Standing unsupported, alternately place foot on step 0  Standing unsupported, one foot in front 0  Standing on one leg 0  Total:  31/56   7/29 3. able to stand independently using hands  4. able to stand safely for 2 minutes  4. able to sit safely and securely for 2 minutes   4. sits safely with minimal use of hands  3. able to transfer safely with definite need of hands  4. able to stand 10 seconds safely  3. able to place feet together independently and stand 1 minute with supervision  3. can reach forward 12 cm (5 inches)    3. able to pick up slipper but needs supervision  2. turns sideways but only maintains balance    2. able to turn 360 degrees safely but slowly  2. able to complete 4 steps without aid with supervision    1. needs help to step but can hold 15 seconds   2. able to lift leg independently and hold >= 3 seconds   40/56     GAIT: Distance walked: 400 Assistive device utilized: Quad cane small base and Shower bench Level of assistance: Modified independence Comments: guarded, reduced step length and cadence, decreased heel strike and toe off  TREATMENT   03/09/24 NuStep L3: UE/LE x 6 min  Supine bridges with TrA  2x10 Supine clams with TrA with RTB x 20 reps  STS from standard chair with forward arm reach, core engaged x 7, 4 reps Standing TrA set with bil shoulder extension to neutral with red band x 20 staggered stance bil row with green band x20ea  Airex marching x20 with SBA  Standing on airex with feet apart- head turns/nods x20ea with SBA step ups 6 x10ea (ue support on rail)     03/03/24 -NuStep L3: UE/LE x 6 min   -STS from standard chair with forward arm reach, core engaged x 10 -Standing TrA set with bil shoulder extension to neutral with red band x 20 - bow and arrow with same side step back x 10 each side - staggered stance bil row with green band x20ea - toe taps to 6 step without UE support 2x10 SBA -Airex marching x20 -Stnading on airex with feet apart- head turns/nods x20ea -step ups 6 x10ea (ue support on counter)  - L stretch at sink x 10s x 3 - bil shoulder flexion stretch, sliding arms up cabinets for thoracic extension 10sec x6 Seated clam GTB 2x15    02/28/24 -NuStep L3: UE/LE x 6 min  -STS from standard chair with forward arm reach, core engaged x 8 -Standing TrA set with bil shoulder extension to neutral with red band x 20 - bow and arrow with same side step back x 10 each side; tactile cues for form - staggered stance bil row with green band 2 x 10 - toe taps to 1st step without UE support - L stretch at sink x 10s x 2 - bil shoulder flexion stretch, sliding arms up cabinets for thoracic extension    02/26/24: Reviewed current function, HEP compliance, pain level, and response to prior treatment.   PPT Supine bridges with TrA  though stopped due to HS cramping TrA activation with RTB supine marches x20 supine clams with RTB x20 Supine ball squeezes Sit to  stands 2x5 Supine manual HS stretch 3x30 sec bilat    [facility] Adult PT Treatment:      02/06/2024 - LTR x20ea - PPT 5 2x10 - Bridges 2x10 - supine ball squeezes 5 x20 - supine clams with RTB x20 - TrA activation with RTB supine marches x20 -supine HSS manual bil -Standing R gastroc stretch  02/04/24                                            Pt seen for aquatic therapy today.  Treatment took place in water 3.5-4.75 ft in depth at the [facility] pool. Temp of water was 91.  Pt entered/exited the pool via stairs using step to and alternating pattern with hand rail.  *UE on barbell->unsupported:  walking forward, backward and side stepping, multiple widths. *Step ups bottom step leading R/L 2 x 5 *forward march with kick unsupported x 4 widths.  VC and demonstration for use of ue to balance without HB *stair tapping 1st step x 10 unsupported->then second x8 unsupported. (Good challenge 2nd step) *standing ue support wall 3.7 ft: toe raises; heel raises; HS curls; relaxed squats; high knee marching; hip add/abd x 12 reps *seated water bench: cycling; hip add/abd; LAQ *STS onto water step with cga-indep 2 x 5 *walking between exercises for recovery    Pt requires the buoyancy and hydrostatic pressure of water for support, and to offload joints by unweighting joint load by at least 50 % in navel deep water and by at least 75-80% in chest to neck deep water.  Viscosity of the water is needed for resistance of strengthening. Water current perturbations provides challenge to standing balance requiring increased core activation.     01/30/2024 - LTR x10ea - PPT 5 x10 - Bridges 2x10 - supine ball squeezes 5 x37min - supine clams with RTB x20 - TrA activation with RTB supine marches -Supine SLR with TA  x10bil -supine HSS manual bil -Standing R gastroc stretch  DATE:01/28/24 Pt seen for aquatic therapy today.  Treatment took place in water 3.5-4.75 ft in depth at the [facility] pool. Temp of water was 91.  Pt entered/exited the pool via stairs using step to and alternating pattern with hand rail.  *UE on barbell: walking forward, backward and side stepping, multiple widths. *forward march with kick ue support barbell *standing ue support wall 3.7 ft: toe raises; heel raises; HS curls; relaxed squats; high knee marching; hip add/abd x 12 reps *seated water bench: cycling; hip add/abd; LAQ *STS onto water step with cga-indep 2 x 5 *forward stepping down off of water step unsupported then up x 3 *step ups bottom step leading R/L 2 x 5 unsupported  *stair tapping 1st step then  second x8 unsupported. (Good challenge 2nd step)  Pt requires the buoyancy and hydrostatic pressure of water for support, and to offload joints by unweighting joint load by at least 50 % in navel deep water and by at least 75-80% in chest to neck deep water.  Viscosity of the water is needed for resistance of strengthening. Water current perturbations provides challenge to standing balance requiring increased core activation.        PATIENT EDUCATION:  Education details: exercise form, exercise rationale, and POC.  Person educated: Patient Education method: Explanation, demonstration, verbal and tactile cues Education comprehension: verbalized understanding, returned demonstration, verbal and tactile cues required  HOME EXERCISE PROGRAM: Walking daily   ASSESSMENT:  CLINICAL IMPRESSION: Pt is improving with activity tolerance as evidenced by performance of land based exercises.  PT is increasing closed chain exercises with land based exercises and pt tolerated exercises well.  She performed exercises well with instruction and cuing for correct form.  Pt able to perform sit to stands from chair without using UE's.  She responded well to treatment having no increased pain after treatment.  Pt will continue to benefit from skilled PT to improve functional strength and functional mobility and to address goals.     OBJECTIVE IMPAIRMENTS: Abnormal gait, decreased activity tolerance, decreased balance, decreased coordination, decreased knowledge of use of DME, decreased mobility, difficulty walking, decreased strength, postural dysfunction, and pain.   ACTIVITY LIMITATIONS: carrying, lifting, bending, standing, squatting, stairs, transfers, and locomotion level  PARTICIPATION LIMITATIONS: meal prep, cleaning, shopping, and community activity  PERSONAL FACTORS: Age, Fitness, Past/current experiences, and Time since onset of injury/illness/exacerbation are also affecting patient's functional  outcome.   REHAB POTENTIAL: Good  CLINICAL DECISION MAKING: Evolving/moderate complexity  EVALUATION COMPLEXITY: Moderate   GOALS: Goals reviewed with patient? Yes  SHORT TERM GOALS: Target date: 12/08/23  Pt will tolerate full aquatic sessions consistently without increase in pain and with improving function to demonstrate good toleration and effectiveness of intervention.  Baseline: Goal status:MET -12/10/23  2.  Pt will tolerate stair climbing using combination of an alternating and step to pattern ascending and descending 6 steps without use of handrail Baseline:  Goal status: Met 12/31/23  3.  Pt will report reduction of pain submerged to 0/10 Baseline:  Goal status: Met 12/31/23  4.  Pt will begin amb routinely 2-3 x weekly with daughter for exercise as HEP. Baseline:  Goal status: In progress 12/31/23; 01/21/24   LONG TERM GOALS: Target date: 04/03/24  Pt to improve on ODI by 13-15 (MCID)  % to demonstrate statistically significant Improvement in function. Baseline:Modified Oswestry 23/50=46%  Goal status: INITIAL   2. Pt will amb x1000 ft without limitation to pain using ad Baseline:  Goal status: In progress 01/21/24; 02/04/24  3.  Pt will improve on Berg balance test to >/= 40/56 to demonstrate a decrease in fall risk. Baseline: 31/56 Goal status: Met 01/21/24  4.  Pt will improve strength in all areas listed by 1 grade to demonstrate improved overall physical function Baseline:  Goal status: In progress 01/21/24  5.  Pt will demonstrate improved gait pattern with more normal step length with heel strike and toe off each step Baseline:  Goal status: In progress 02/04/24  6.  Pt will improve on Tug test to <or= 21s (avg for older adults) to demonstrate improvement in lower extremity function, mobility and decreased fall risk. Baseline:  Goal status: MET -02/28/24  PLAN:  PT FREQUENCY: 2x/week  PT DURATION: 8 weeks  PLANNED INTERVENTIONS: 97164- PT  Re-evaluation, 97110-Therapeutic exercises, 97530- Therapeutic activity, 97112- Neuromuscular re-education, 97535- Self Care, [id number]- Manual therapy, [phone]- Gait training, [phone]- Aquatic Therapy, [phone]- Electrical stimulation (unattended), [phone]- Ionotophoresis 4mg /ml Dexamethasone, Patient/Family education, Balance training, Stair training, Taping, Dry Needling, Joint mobilization, DME instructions, Cryotherapy, and Moist heat.  PLAN FOR NEXT SESSION: general core and LE strengthening, balance retraining, gait, AD instruction, pain management.   Land: progressive balance and strengthening of core and LB  Leigh Minerva III PT, DPT 03/10/24 8:47 PM  [facility] [address] [place], [place], [address] Phone: [phone]   Fax:  [phone]

## 2024-03-10 ENCOUNTER — Ambulatory Visit (HOSPITAL_BASED_OUTPATIENT_CLINIC_OR_DEPARTMENT_OTHER)

## 2024-03-10 ENCOUNTER — Encounter (HOSPITAL_BASED_OUTPATIENT_CLINIC_OR_DEPARTMENT_OTHER): Payer: Self-pay | Admitting: Physical Therapy

## 2024-03-10 ENCOUNTER — Other Ambulatory Visit

## 2024-03-12 ENCOUNTER — Other Ambulatory Visit (INDEPENDENT_AMBULATORY_CARE_PROVIDER_SITE_OTHER)

## 2024-03-12 ENCOUNTER — Other Ambulatory Visit (HOSPITAL_COMMUNITY)

## 2024-03-12 ENCOUNTER — Ambulatory Visit: Payer: Self-pay | Admitting: Family Medicine

## 2024-03-12 ENCOUNTER — Ambulatory Visit (HOSPITAL_BASED_OUTPATIENT_CLINIC_OR_DEPARTMENT_OTHER): Admitting: Physical Therapy

## 2024-03-12 ENCOUNTER — Ambulatory Visit (HOSPITAL_BASED_OUTPATIENT_CLINIC_OR_DEPARTMENT_OTHER)
Admission: RE | Admit: 2024-03-12 | Discharge: 2024-03-12 | Disposition: A | Discharge status: Home / Self Care | Source: Ambulatory Visit | Attending: Family Medicine | Admitting: Family Medicine

## 2024-03-12 ENCOUNTER — Encounter (HOSPITAL_BASED_OUTPATIENT_CLINIC_OR_DEPARTMENT_OTHER): Payer: Self-pay

## 2024-03-12 DIAGNOSIS — Z1231 Encounter for screening mammogram for malignant neoplasm of breast: Secondary | ICD-10-CM | POA: Insufficient documentation

## 2024-03-12 DIAGNOSIS — E039 Hypothyroidism, unspecified: Secondary | ICD-10-CM | POA: Diagnosis not present

## 2024-03-12 LAB — TSH: TSH: 0.38 u[IU]/mL (ref 0.35–5.50)

## 2024-03-13 ENCOUNTER — Ambulatory Visit (HOSPITAL_COMMUNITY)
Admission: RE | Admit: 2024-03-13 | Discharge: 2024-03-13 | Disposition: A | Discharge status: Home / Self Care | Source: Ambulatory Visit | Attending: Family Medicine | Admitting: Family Medicine

## 2024-03-13 DIAGNOSIS — R0609 Other forms of dyspnea: Secondary | ICD-10-CM | POA: Diagnosis present

## 2024-03-13 LAB — ECHOCARDIOGRAM COMPLETE
AR max vel: 2.06 cm2
AV Area VTI: 2.07 cm2
AV Area mean vel: 1.95 cm2
AV Mean grad: 4.3 mmHg
AV Peak grad: 8.4 mmHg
Ao pk vel: 1.45 m/s
Area-P 1/2: 3.53 cm2
Calc EF: 59.9 %
S' Lateral: 2.5 cm
Single Plane A2C EF: 59.1 %
Single Plane A4C EF: 60.2 %

## 2024-03-17 ENCOUNTER — Encounter (HOSPITAL_BASED_OUTPATIENT_CLINIC_OR_DEPARTMENT_OTHER)

## 2024-03-19 ENCOUNTER — Ambulatory Visit (HOSPITAL_BASED_OUTPATIENT_CLINIC_OR_DEPARTMENT_OTHER): Admitting: Physical Therapy

## 2024-03-19 ENCOUNTER — Other Ambulatory Visit: Payer: Self-pay | Admitting: Internal Medicine

## 2024-03-19 ENCOUNTER — Other Ambulatory Visit: Payer: Self-pay

## 2024-03-23 ENCOUNTER — Telehealth: Payer: Self-pay | Admitting: Family Medicine

## 2024-03-23 NOTE — Telephone Encounter (Signed)
 Copied from CRM #[id number]. Topic: Medicare AWV >> Mar 23, 2024  2:35 PM Nathanel DEL wrote: Reason for CRM: Called LVM 03/23/2024 to schedule AWV. Please schedule Virtual or Telehealth visits ONLY.   Nathanel Paschal; Care Guide Ambulatory Clinical Support [place] [facility] Direct Dial: [phone]

## 2024-03-26 ENCOUNTER — Ambulatory Visit: Payer: Self-pay | Admitting: Internal Medicine

## 2024-03-26 ENCOUNTER — Other Ambulatory Visit: Payer: Self-pay

## 2024-03-26 MED ORDER — METOPROLOL TARTRATE 25 MG PO TABS: 25.0000 mg | ORAL_TABLET | Freq: Two times a day (BID) | ORAL | 3 refills | Status: AC

## 2024-03-28 ENCOUNTER — Other Ambulatory Visit: Payer: Self-pay | Admitting: Neurology

## 2024-03-31 ENCOUNTER — Other Ambulatory Visit: Payer: Self-pay | Admitting: Family Medicine

## 2024-03-31 DIAGNOSIS — F419 Anxiety disorder, unspecified: Secondary | ICD-10-CM

## 2024-03-31 NOTE — Telephone Encounter (Signed)
 Last written 03/03/2024 #90 with no refills Last appt 01/22/2024

## 2024-04-03 DIAGNOSIS — I493 Ventricular premature depolarization: Secondary | ICD-10-CM | POA: Diagnosis not present

## 2024-04-30 ENCOUNTER — Other Ambulatory Visit: Payer: Self-pay | Admitting: Family Medicine

## 2024-04-30 DIAGNOSIS — F419 Anxiety disorder, unspecified: Secondary | ICD-10-CM

## 2024-04-30 NOTE — Telephone Encounter (Signed)
 Requesting: lorazepam  1mg  Contract:01/22/24 UDS:01/22/24 Last Visit: 01/22/24 Next Visit: None Last Refill: 04/01/24 #90 and 0RF   Please Advise

## 2024-05-07 ENCOUNTER — Ambulatory Visit: Admitting: Family Medicine

## 2024-05-08 ENCOUNTER — Other Ambulatory Visit: Payer: Self-pay | Admitting: Orthopedic Surgery

## 2024-05-08 DIAGNOSIS — K219 Gastro-esophageal reflux disease without esophagitis: Secondary | ICD-10-CM

## 2024-05-12 ENCOUNTER — Ambulatory Visit (INDEPENDENT_AMBULATORY_CARE_PROVIDER_SITE_OTHER): Admitting: Family Medicine

## 2024-05-12 ENCOUNTER — Encounter: Payer: Self-pay | Admitting: Family Medicine

## 2024-05-12 VITALS — BP 130/60 | HR 56 | Resp 18 | Ht 64.0 in | Wt 170.6 lb

## 2024-05-12 DIAGNOSIS — K219 Gastro-esophageal reflux disease without esophagitis: Secondary | ICD-10-CM

## 2024-05-12 DIAGNOSIS — E7849 Other hyperlipidemia: Secondary | ICD-10-CM

## 2024-05-12 DIAGNOSIS — I10 Essential (primary) hypertension: Secondary | ICD-10-CM

## 2024-05-12 DIAGNOSIS — F419 Anxiety disorder, unspecified: Secondary | ICD-10-CM

## 2024-05-12 DIAGNOSIS — R7303 Prediabetes: Secondary | ICD-10-CM | POA: Insufficient documentation

## 2024-05-12 DIAGNOSIS — G4739 Other sleep apnea: Secondary | ICD-10-CM | POA: Diagnosis not present

## 2024-05-12 DIAGNOSIS — F411 Generalized anxiety disorder: Secondary | ICD-10-CM

## 2024-05-12 DIAGNOSIS — M545 Low back pain, unspecified: Secondary | ICD-10-CM | POA: Diagnosis not present

## 2024-05-12 DIAGNOSIS — G8929 Other chronic pain: Secondary | ICD-10-CM

## 2024-05-12 LAB — COMPREHENSIVE METABOLIC PANEL WITH GFR
ALT: 12 U/L (ref 0–35)
AST: 16 U/L (ref 0–37)
Albumin: 3.9 g/dL (ref 3.5–5.2)
Alkaline Phosphatase: 94 U/L (ref 39–117)
BUN: 17 mg/dL (ref 6–23)
CO2: 28 meq/L (ref 19–32)
Calcium: 9.1 mg/dL (ref 8.4–10.5)
Chloride: 101 meq/L (ref 96–112)
Creatinine, Ser: 0.98 mg/dL (ref 0.40–1.20)
GFR: 51.46 mL/min — ABNORMAL LOW (ref >60.00)
Glucose, Bld: 109 mg/dL — ABNORMAL HIGH (ref 70–99)
Potassium: 4.6 meq/L (ref 3.5–5.1)
Sodium: 136 meq/L (ref 135–145)
Total Bilirubin: 0.5 mg/dL (ref 0.2–1.2)
Total Protein: 6.6 g/dL (ref 6.0–8.3)

## 2024-05-12 LAB — TSH: TSH: 0.43 u[IU]/mL (ref 0.35–5.50)

## 2024-05-12 LAB — HEMOGLOBIN A1C: Hgb A1c MFr Bld: 6.1 % (ref 4.6–6.5)

## 2024-05-12 MED ORDER — ATORVASTATIN CALCIUM 10 MG PO TABS: 10.0000 mg | ORAL_TABLET | Freq: Every day | ORAL | 1 refills | Status: AC

## 2024-05-12 MED ORDER — OMEPRAZOLE 40 MG PO CPDR
40.0000 mg | DELAYED_RELEASE_CAPSULE | Freq: Every day | ORAL | 1 refills | Status: AC
Start: 2024-05-12 — End: ?

## 2024-05-12 MED ORDER — LORAZEPAM 1 MG PO TABS: 1.0000 mg | ORAL_TABLET | Freq: Three times a day (TID) | ORAL | 0 refills | Status: DC | PRN

## 2024-05-12 NOTE — Assessment & Plan Note (Signed)
 GERD managed with omeprazole . - Continue omeprazole  daily.

## 2024-05-12 NOTE — Assessment & Plan Note (Signed)
 Hyperlipidemia managed with atorvastatin .  - Continue atorvastatin  10 mg daily.

## 2024-05-12 NOTE — Assessment & Plan Note (Signed)
 Blood pressure is at goal for age and co-morbidities.   Recommendations: continue metoprolol  25 mg BID - BP goal <130/80 - monitor and log blood pressures at home - check around the same time each day in a relaxed setting - Limit salt to <2000 mg/day - Follow DASH eating plan (heart healthy diet) - limit alcohol to 2 standard drinks per day for men and 1 per day for women - avoid tobacco products - get at least 2 hours of regular aerobic exercise weekly Patient aware of signs/symptoms requiring further/urgent evaluation.

## 2024-05-12 NOTE — Assessment & Plan Note (Signed)
 Repeat A1c today. Healthy lifestyle encouraged.

## 2024-05-12 NOTE — Assessment & Plan Note (Signed)
 Chronic back pain previously managed with tramadol , but she has been able to wean off effectively!

## 2024-05-12 NOTE — Assessment & Plan Note (Signed)
 Long-standing anxiety managed with lorazepam .  - Continue lorazepam  1 mg three times daily - she has been able to wean somewhat, mostly only needing twice daily;- - Discuss safety precautions due to benzodiazepine use given her age. States she has tried weaning before and it did not go well. She is stable and is aware of safety concerns.

## 2024-05-12 NOTE — Progress Notes (Signed)
 Established Patient Office Visit  Subjective:  Patient ID: Julie Park, female    DOB: [birth year]  Age: 89 y.o. MRN: [id number]  CC:  Chief Complaint  Patient presents with   Medication Refill      HPI Julie Park is here for medication refill. She is here with her daughter.    Discussed the use of AI scribe software for clinical note transcription with the patient, who gave verbal consent to proceed.  History of Present Illness Julie Park is an 89 year old female who presents for follow-up on medication refills for controlled substances. She is accompanied by her daughter.  She has been on controlled substances since the 1980s, initially starting with a lower dose and gradually increasing over time due to stress and caregiving responsibilities. 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  Recently, she has reduced her lorazepam  intake from three times a day to twice a day, taking one whole tablet between 12 and 1 PM and another before bed. She has stopped taking tramadol  in the last couple of months. She experiences sleep disturbances, queasiness, and intrusive thoughts, particularly in the mornings when she lacks distractions.  She has difficulty sleeping, waking up several times during the night, and taking hours to fall back asleep. She does not nap during the day. She experiences anxiety and intrusive thoughts, particularly about family concerns, which may contribute to her sleep issues.  Her daughter has noted episodes of apnea during sleep and reports that she snores. She has not had a sleep study before.  She engages in activities such as playing [place]  [employer] games, doing household chores, and reading short stories and poetry, but avoids fiction. She uses an iPad frequently, which may impact her sleep quality.    Mood follow-up: - Diagnosis: Anxiety -  Treatment: lorazepam  1 mg three times a day (she is taking 2 per day most days, trying to wean herself)  - Medication side effects:  - SI/HI:  - Update:  Chronic Back Pain: - Managed with tramadol  25 mg once daily - she was able to wean herself off a few motnhs ago     Hypertension: - Medications: Metoprolol  tartrate 25 mg daily and Lasix  20 mg daily. - Compliance: good - Checking BP at home:  - Denies any SOB, recurrent headaches, CP, vision changes, LE edema, dizziness, palpitations, or medication side effects.    Past Medical History:  Diagnosis Date   Anxiety    Chest pain 02/08/15   ETT normal   Diverticulosis    Diverticulitis (1982)   Fracture of rib of right side 08/16/2013   GERD (gastroesophageal reflux disease)    Hyperlipidemia, mixed    Hypothyroidism    IBS (irritable bowel syndrome)    Morton's neuroma    Right lumbar radiculopathy    Intermittent x 2 episodes in summer 2014   Shingles 1967 and 2010   Ulcer    Urine incontinence    Vertigo     Past Surgical History:  Procedure Laterality Date   CARDIOVASCULAR STRESS TEST  02/08/15   ETT normal   CATARACT EXTRACTION  2012   Dr. Cleatus   COLONOSCOPY  2005   Dr. Myriam at [facility] (normal per pt report)   DEXA  11/03/13   Heel T score -0.8.   FOOT NEUROMA SURGERY  1994   right foot   TONSILLECTOMY  TONSILLECTOMY AND ADENOIDECTOMY  1948   Dr. Neomia Benne [facility]   TUBAL LIGATION  1974   WISDOM TOOTH EXTRACTION      Family History  Problem Relation Age of Onset   Tuberculosis Mother    Pancreatic cancer Father    Asthma Daughter    Heart attack Paternal Grandfather     Social History   Socioeconomic History   Marital status: Single    Spouse name: Not on file   Number of children: Not on file   Years of education: Not on file   Highest education level: Not on file  Occupational History   Occupation: Retired  Tobacco Use   Smoking status: Former    Current packs/day: 1.00     Average packs/day: 1 pack/day for 40.9 years (40.9 ttl pk-yrs)    Types: Cigarettes    Start date: 06/26/1983   Smokeless tobacco: Never  Vaping Use   Vaping status: Never Used  Substance and Sexual Activity   Alcohol use: No   Drug use: No   Sexual activity: Not Currently  Other Topics Concern   Not on file  Social History Narrative   Divorced, 2 children.   Lives in [place].   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   Tobacco: small amount, distant past.  Alcohol: none.           Social History      Diet? n/a      Do you drink/eat things with caffeine? coffee      Marital status?     Divorced (1984)                              What year were you married? 1958      Do you live in a house, apartment, assisted living, condo, trailer, etc.? house      Is it one or more stories?  yes      How many persons live in your home? Living with daughter and husband at the present      Do you have any pets in your home? (please list) no       Highest level of education completed? 16 years      Current or past profession: teacher grades 1 and 2, education coordinator [employer]       Do you exercise?                   yes                   Type & how often? Walk 1-3 times per week      Advanced Directives      Do you have a living will? yes      Do you have a DNR form?                                  If not, do you want to discuss one?      Do you have signed POA/HPOA for forms? yes      Functional Status      Do you have difficulty bathing or dressing yourself?      Do you have difficulty preparing food or eating?       Do you have difficulty managing  your medications?      Do you have difficulty managing your finances?      Do you have difficulty affording your medications?      Social Drivers of Corporate Investment Banker Strain: Low Risk  (09/27/2022)   Overall Financial Resource Strain  (CARDIA)    Difficulty of Paying Living Expenses: Not hard at all  Food Insecurity: No Food Insecurity (09/27/2022)   Hunger Vital Sign    Worried About Running Out of Food in the Last Year: Never true    Ran Out of Food in the Last Year: Never true  Transportation Needs: No Transportation Needs (09/27/2022)   PRAPARE - Administrator, Civil Service (Medical): No    Lack of Transportation (Non-Medical): No  Physical Activity: Inactive (09/27/2022)   Exercise Vital Sign    Days of Exercise per Week: 0 days    Minutes of Exercise per Session: 0 min  Stress: No Stress Concern Present (09/27/2022)   [employer] of Occupational Health - Occupational Stress Questionnaire    Feeling of Stress : Only a little  Social Connections: Socially Isolated (09/27/2022)   Social Connection and Isolation Panel    Frequency of Communication with Friends and Family: Three times a week    Frequency of Social Gatherings with Friends and Family: More than three times a week    Attends Religious Services: Never    Database Administrator or Organizations: Not on file    Attends Banker Meetings: Never    Marital Status: Divorced  Catering Manager Violence: Not At Risk (09/27/2022)   Humiliation, Afraid, Rape, and Kick questionnaire    Fear of Current or Ex-Partner: No    Emotionally Abused: No    Physically Abused: No    Sexually Abused: No    ROS All ROS negative except what is listed in the HPI.   Objective:   Today's Vitals: BP 130/60   Pulse (!) 56   Resp 18   Ht 5' 4 (1.626 m)   Wt 170 lb 9.6 oz (77.4 kg)   SpO2 98%   BMI 29.28 kg/m   Physical Exam Vitals reviewed.  Constitutional:      General: She is not in acute distress.    Appearance: Normal appearance. She is not ill-appearing.  Cardiovascular:     Rate and Rhythm: Normal rate and regular rhythm.     Heart sounds: Normal heart sounds.  Pulmonary:     Effort: Pulmonary effort is normal.     Breath sounds:  Normal breath sounds.  Skin:    General: Skin is warm and dry.  Neurological:     Mental Status: She is alert and oriented to person, place, and time.  Psychiatric:        Mood and Affect: Mood normal.        Behavior: Behavior normal.        Thought Content: Thought content normal.        Judgment: Judgment normal.       Assessment & Plan:   Problem List Items Addressed This Visit       Active Problems   HLD (hyperlipidemia) (Chronic)   Hyperlipidemia managed with atorvastatin .  - Continue atorvastatin  10 mg daily.      Relevant Medications   atorvastatin  (LIPITOR) 10 MG tablet   GERD (gastroesophageal reflux disease)   GERD managed with omeprazole . - Continue omeprazole  daily.      Relevant Medications   omeprazole  (  PRILOSEC) 40 MG capsule   GAD (generalized anxiety disorder)   Long-standing anxiety managed with lorazepam .  - Continue lorazepam  1 mg three times daily - she has been able to wean somewhat, mostly only needing twice daily;- - Discuss safety precautions due to benzodiazepine use given her age. States she has tried weaning before and it did not go well. She is stable and is aware of safety concerns.      Relevant Medications   LORazepam  (ATIVAN ) 1 MG tablet (Start on 05/15/2024)   HTN (hypertension) - Primary   Blood pressure is at goal for age and co-morbidities.   Recommendations: continue metoprolol  25 mg BID - BP goal <130/80 - monitor and log blood pressures at home - check around the same time each day in a relaxed setting - Limit salt to <2000 mg/day - Follow DASH eating plan (heart healthy diet) - limit alcohol to 2 standard drinks per day for men and 1 per day for women - avoid tobacco products - get at least 2 hours of regular aerobic exercise weekly Patient aware of signs/symptoms requiring further/urgent evaluation.        Relevant Medications   atorvastatin  (LIPITOR) 10 MG tablet   Other Relevant Orders   Comprehensive metabolic  panel with GFR   TSH   Chronic low back pain   Chronic back pain previously managed with tramadol , but she has been able to wean off effectively!      Prediabetes   Repeat A1c today. Healthy lifestyle encouraged.       Relevant Orders   HgB A1c   Other Visit Diagnoses       Sleep apnea-like behavior     Referral for sleep study   Relevant Orders   Ambulatory referral to Pulmonology     Anxiety       Relevant Medications   LORazepam  (ATIVAN ) 1 MG tablet (Start on 05/15/2024)           Follow-up: Return in about 3 months (around 08/12/2024) for chronic disease management.   Waddell FURY Almarie, DNP, FNP-C  I,Emily Lagle,acting as a neurosurgeon for Waddell KATHEE Almarie, NP.,have documented all relevant documentation on the behalf of Waddell KATHEE Almarie, NP.  I, Waddell KATHEE Almarie, NP, have reviewed all documentation for this visit. The documentation on 05/12/2024 for the exam, diagnosis, procedures, and orders are all accurate and complete.

## 2024-05-13 ENCOUNTER — Ambulatory Visit (INDEPENDENT_AMBULATORY_CARE_PROVIDER_SITE_OTHER)

## 2024-05-13 ENCOUNTER — Ambulatory Visit: Payer: Self-pay | Admitting: Family Medicine

## 2024-05-13 ENCOUNTER — Encounter (HOSPITAL_BASED_OUTPATIENT_CLINIC_OR_DEPARTMENT_OTHER): Payer: Self-pay

## 2024-05-13 VITALS — BP 112/60 | HR 52 | Ht 64.0 in | Wt 171.0 lb

## 2024-05-13 DIAGNOSIS — G4719 Other hypersomnia: Secondary | ICD-10-CM | POA: Diagnosis not present

## 2024-05-13 NOTE — Patient Instructions (Signed)
 Complete home sleep test as ordered.  Follow sleep hygiene as discussed.  Follow up in 6-8 weeks.

## 2024-05-13 NOTE — Progress Notes (Signed)
 @Patient  ID: Julie Park, female    DOB: 1933/07/06, 89 y.o.   MRN: [id number]  Chief Complaint  Patient presents with   Establish Care    New Sleep     Referring provider: Almarie Waddell NOVAK, NP  HPI: Discussed the use of AI scribe software for clinical note transcription with the patient, who gave verbal consent to proceed.  History of Present Illness Julie Park is an 89 year old female who presents with sleep disturbances. She is accompanied by her daughter, [name]. She was referred by her primary care provider, Waddell Almarie, for evaluation of sleep apnea.  She has a long-standing history of snoring and episodes of apnea during sleep, as observed by her daughter. She experiences frequent awakenings throughout the night, often on the hour, and wakes up feeling unrefreshed. Her typical sleep schedule is from 9-10 PM to 7-9 AM, but she had a restless night prior to this visit due to anxiety about the appointment. She also experiences nocturnal dry mouth, necessitating water at her bedside.  She has not undergone a sleep study and has not been formally diagnosed with sleep apnea. She does wake herself up with snorting and 'mouth sounds.'  Her past medical history includes hypertension and premature ventricular contractions (PVCs), for which she is treated with Lasix  and metoprolol . She notes improvement in her heart-related symptoms since starting these medications. A recent heart monitor test did not show significant findings. No history of heart attack or stroke.  Regarding her daily activities, she does not typically fall asleep during the day, even when reading or watching TV. However, she acknowledges that she might fall asleep if lying down in the afternoon, although she rarely gives herself the opportunity to nap.     TEST/EVENTS : n/a  No Active Allergies  Immunization History  Administered Date(s) Administered   Fluad  Quad(high Dose 65+) 04/13/2021   Fluad   Trivalent(High Dose 65+) 04/03/2023   Fluzone  Influenza virus vaccine,trivalent (IIV3), split virus 04/07/2004, 04/09/2005, 03/29/2006, 03/27/2007, 03/11/2008, 03/03/2009, 02/23/2010, 03/19/2011, 03/06/2012, 03/04/2013, 03/04/2014   Hep B, Unspecified 03/29/1992, 04/26/1992, 10/04/1992   INFLUENZA, HIGH DOSE SEASONAL PF 02/23/2013, 03/09/2016, 03/25/2017, 04/29/2018, 04/24/2019, 04/11/2022, 03/07/2024   Influenza Split 02/24/2012   Influenza,inj,Quad PF,6+ Mos 02/23/2013   Influenza,inj,quad, With Preservative 03/08/2015   Influenza-Unspecified 05/21/1994, 05/10/1995, 04/16/1996, 04/15/1997, 05/15/1999, 04/08/2001, 03/30/2002, 03/30/2003, 02/24/2012, 03/08/2015, 03/26/2017, 05/14/2020   Novel Infuenza-h1n1-09 07/08/2008   PFIZER(Purple Top)SARS-COV-2 Vaccination 07/15/2019, 08/04/2019, 08/14/2019, 04/14/2020   Pfizer Covid-19 Vaccine Bivalent Booster 24yrs & up 05/10/2021   Pfizer(Comirnaty )Fall Seasonal Vaccine 12 years and older 03/23/2022, 04/03/2023   Pneumococcal Conjugate,unspecified 03/25/1996, 03/20/2013   Pneumococcal Conjugate-13 03/25/1996, 03/20/2013   Pneumococcal Polysaccharide-23 04/16/1996, 03/20/2013   Pneumococcal-Unspecified 03/20/2013   Td 12/20/2018   Tdap 09/17/2013   Zoster, Live 08/23/2012    Past Medical History:  Diagnosis Date   Anxiety    Chest pain 02/08/15   ETT normal   Diverticulosis    Diverticulitis (1982)   Fracture of rib of right side 08/16/2013   GERD (gastroesophageal reflux disease)    Hyperlipidemia, mixed    Hypothyroidism    IBS (irritable bowel syndrome)    Morton's neuroma    Right lumbar radiculopathy    Intermittent x 2 episodes in summer 2014   Shingles 1967 and 2010   Ulcer    Urine incontinence    Vertigo     Tobacco History: Social History   Tobacco Use  Smoking Status Former   Current packs/day: 1.00  Average packs/day: 1 pack/day for 40.9 years (40.9 ttl pk-yrs)   Types: Cigarettes   Start date: 06/26/1983  Smokeless  Tobacco Never   Counseling given: Not Answered   Outpatient Medications Prior to Visit  Medication Sig Dispense Refill   acetaminophen  (TYLENOL ) 325 MG tablet Take 2 tablets (650 mg total) by mouth every 6 (six) hours as needed. 30 tablet 0   aspirin  81 MG tablet Take 81 mg by mouth every morning.      atorvastatin  (LIPITOR) 10 MG tablet Take 1 tablet (10 mg total) by mouth daily. 90 tablet 1   Fiber POWD Take by mouth.     fluticasone  (FLONASE ) 50 MCG/ACT nasal spray SPRAY 2 SPRAYS INTO EACH NOSTRIL EVERY DAY 48 mL 3   furosemide  (LASIX ) 20 MG tablet Take 1 tablet (20 mg total) by mouth daily. 90 tablet 3   gabapentin  (NEURONTIN ) 300 MG capsule Take 2 capsules (600 mg total) by mouth 3 (three) times daily. 180 capsule 11   levothyroxine  (SYNTHROID ) 88 MCG tablet Take 1 tablet (88 mcg total) by mouth daily. 90 tablet 1   loratadine  (CLARITIN ) 10 MG tablet TAKE 1 TABLET BY MOUTH EVERY DAY 90 tablet 3   [START ON 05/15/2024] LORazepam  (ATIVAN ) 1 MG tablet Take 1 tablet (1 mg total) by mouth every 8 (eight) hours as needed for anxiety. 90 tablet 0   Magnesium  250 MG TABS Take 2 tablets by mouth daily.      metoprolol  tartrate (LOPRESSOR ) 25 MG tablet Take 1 tablet (25 mg total) by mouth 2 (two) times daily. 180 tablet 3   nitroGLYCERIN  (NITROSTAT ) 0.4 MG SL tablet PLACE 1 TABLET (0.4 MG TOTAL) UNDER THE TONGUE EVERY 5 (FIVE) MINUTES X 3 DOSES AS NEEDED FOR CHEST PAIN (IF NO RELIEF AFTER 3RD DOSE, PROCEED TO ED OR CALL 911). 12 tablet 0   nystatin  cream (MYCOSTATIN ) APPLY 1 APPLICATION TOPICALLY AS NEEDED FOR DRY SKIN. 30 g 3   omeprazole  (PRILOSEC) 40 MG capsule Take 1 capsule (40 mg total) by mouth daily. 90 capsule 1   ondansetron  (ZOFRAN ) 4 MG tablet Take 1 tablet (4 mg total) by mouth every 8 (eight) hours as needed for nausea or vomiting. 20 tablet 0   Propylene Glycol (SYSTANE BALANCE) 0.6 % SOLN Apply to eye daily as needed.     sodium chloride  (OCEAN) 0.65 % nasal spray Place 1 spray  into the nose as needed for congestion. 30 mL 12   Facility-Administered Medications Prior to Visit  Medication Dose Route Frequency Provider Last Rate Last Admin   denosumab  (PROLIA ) injection 60 mg  60 mg Subcutaneous Q6 months [place], Amy E, NP         Review of Systems: as per HPI  Constitutional:   No  weight loss, night sweats,  Fevers, chills, fatigue, or  lassitude.  HEENT:   No headaches,  Difficulty swallowing,  Tooth/dental problems, or  Sore throat,                No sneezing, itching, ear ache, nasal congestion, post nasal drip,   CV:  No chest pain,  Orthopnea, PND, swelling in lower extremities, anasarca, dizziness, palpitations, syncope.   GI  No heartburn, indigestion, abdominal pain, nausea, vomiting, diarrhea, change in bowel habits, loss of appetite, bloody stools.   Resp: No shortness of breath with exertion or at rest.  No excess mucus, no productive cough,  No non-productive cough,  No coughing up of blood.  No change in color  of mucus.  No wheezing.  No chest wall deformity  Skin: no rash or lesions.  GU: no dysuria, change in color of urine, no urgency or frequency.  No flank pain, no hematuria   MS:  No joint pain or swelling.  No decreased range of motion.  No back pain.    Physical Exam  BP 112/60   Pulse (!) 52   Ht 5' 4 (1.626 m)   Wt 171 lb (77.6 kg)   SpO2 96%   BMI 29.35 kg/m   GEN: A/Ox3; pleasant , NAD, well nourished    HEENT:  [place]/AT,  EACs-clear, TMs-wnl, NOSE-clear, THROAT-clear, no lesions, no postnasal drip or exudate noted. Mallampati 4  NECK:  Supple w/ fair ROM; no JVD; normal carotid impulses w/o bruits; no thyromegaly or nodules palpated; no lymphadenopathy.    RESP  Clear  P & A; w/o, wheezes/ rales/ or rhonchi. no accessory muscle use, no dullness to percussion  CARD:  RRR, no m/r/g, no peripheral edema, pulses intact, no cyanosis or clubbing.  GI:   Soft & nt; nml bowel sounds; no organomegaly or masses detected.    Musco: Warm bil, no deformities or joint swelling noted.   Neuro: alert, no focal deficits noted.    Skin: Warm, no lesions or rashes    Lab Results:  CBC    Component Value Date/Time   WBC 4.7 01/22/2024 1125   RBC 4.68 01/22/2024 1125   HGB 13.8 01/22/2024 1125   HCT 42.3 01/22/2024 1125   PLT 235.0 01/22/2024 1125   MCV 90.3 01/22/2024 1125   MCH 28.6 02/06/2023 0934   MCHC 32.6 01/22/2024 1125   RDW 14.2 01/22/2024 1125   LYMPHSABS 1.4 01/22/2024 1125   MONOABS 0.6 01/22/2024 1125   EOSABS 0.1 01/22/2024 1125   BASOSABS 0.0 01/22/2024 1125    BMET    Component Value Date/Time   NA 136 05/12/2024 1314   K 4.6 05/12/2024 1314   CL 101 05/12/2024 1314   CO2 28 05/12/2024 1314   GLUCOSE 109 (H) 05/12/2024 1314   BUN 17 05/12/2024 1314   CREATININE 0.98 05/12/2024 1314   CREATININE 0.76 12/23/2023 1508   CALCIUM  9.1 05/12/2024 1314   GFRNONAA 72 08/15/2020 1236   GFRAA 83 08/15/2020 1236    BNP    Component Value Date/Time   BNP 107.0 (H) 02/21/2024 1248   BNP 43 10/05/2021 1511    ProBNP No results found for: PROBNP  Imaging: No results found.  Administration History     None           No data to display          No results found for: NITRICOXIDE   Assessment & Plan:   Assessment & Plan Excessive daytime sleepiness  Assessment and Plan Assessment & Plan  Suspected obstructive sleep apnea due to snoring, daytime fatigue, and witnessed apneic periods. Untreated sleep apnea can cause cardiovascular complications. OSA treatment options reviewed but will depend on HST results. - Ordered home sleep test to confirm diagnosis and assess severity. - Will discuss further interventions based on sleep test results. -  Follow sleep hygiene as discussed.    Return in about 7 weeks (around 07/01/2024) for sleep study review.  Candis Dandy, PA-C 05/13/2024

## 2024-05-13 NOTE — Progress Notes (Signed)
 Epworth Sleepiness Scale  Use the following scale to choose the most appropriate number for each situation. 0 Would never nod off 1  Slight  chance of nodding off 2 Moderate chance of nodding off 3 High chance of nodding off  Sitting and reading: 0 Watching TV: 0 Sitting, inactive, in a public place (e.g., in a meeting, theater, or dinner event): 1 As a passenger in a car for an hour or more without stopping for a break: 1 Lying down to rest when circumstances permit:3 Sitting and talking to someone: 0 Sitting quietly after a meal without alcohol: 0 In a car, while stopped for a few  minutes in traffic or at a light: 0   TOTOAL: 5

## 2024-05-25 NOTE — Progress Notes (Unsigned)
 Cardiology Office Note    Date:  05/28/2024  ID:  Julie Park, DOB [birth year], MRN [id number] Cardiologist: Diannah SHAUNNA Maywood, MD { :  History of Present Illness:    Julie Park is a 89 y.o. female with past medical history of hypothyroidism, HLD, GERD, GAD and PVC's who presents to the office today for 16-month follow-up.   She was last examined by Dr. Mallipeddi in 01/2024 and reported worsening dizziness over the past 2 months along with dyspnea on exertion. EKG at the time of her visit showed frequent PVC's. A 2-week monitor and echocardiogram were recommended for further assessment and she was started on Lasix  20mg  daily.   Her echo showed a normal EF of 60-65% with no regional WMA. RV function was normal and she did have trivial MR but no significant valve abnormalities. Her event monitor showed predominately NSR with an average HR of 58 bpm with 24 runs of SVT with the longest lasting for 14.7 seconds. She did have a 6.1% PVC burden and was continued on Lopressor  25mg  BID.   In talking with the patient today, she reports her palpitations have improved since her last office visit. She reports having occasional episodes of presyncope which last for less than a second and then resolve. She is unsure of how frequently these episodes are occurring but says she did have an episode last week while sitting down. She does try to stay hydrated and mostly consumes water. No recent chest pain, orthopnea or PND. Does have baseline dyspnea on exertion which is typically most notable when walking up steps in her home but says this has been occurring for several years and overall stable with no acute changes.  Studies Reviewed:   EKG: EKG is not ordered today.  NST: 08/2022   Stress ECG is negative for ischemia   LV perfusion is normal. There is no evidence of ischemia. There is no evidence of infarction.   Left ventricular function is normal. Nuclear stress EF: 74 %.   The study is  normal. The study is low risk.  Echocardiogram: 02/2024 IMPRESSIONS     1. Left ventricular ejection fraction, by estimation, is 60 to 65%. The  left ventricle has normal function. The left ventricle has no regional  wall motion abnormalities. Left ventricular diastolic parameters are  indeterminate.   2. Right ventricular systolic function is normal. The right ventricular  size is normal.   3. The mitral valve is normal in structure. Trivial mitral valve  regurgitation. No evidence of mitral stenosis.   4. The tricuspid valve is abnormal.   5. The aortic valve was not well visualized. Aortic valve regurgitation  is not visualized. No aortic stenosis is present.   6. The inferior vena cava is normal in size with greater than 50%  respiratory variability, suggesting right atrial pressure of 3 mmHg.   Comparison(s): A prior study was performed on 09/06/2022. EF 65-70%.  Trivial MR.   Event Monitor: 02/2024   Patch wear time was for 13 days and 18 hours.   Normal sinus rhythm predominantly ranging from 40 to 164 bpm with average HR of 58 bpm.   24 runs of non-sustained SVT runs occured, fastest interval lasting 5 beats and longest lasting 14.7 seconds.   No evidence of atrial fibrillation/flutter, ventricular arrhythmias, high-grade AV block or pauses.   <1% PAC burden and 6.1% PVC burden.   Symptoms correlated with NSR, 67 bpm.   Physical Exam:   VS:  BP  124/70   Pulse (!) 50   Ht 5' 4 (1.626 m)   Wt 172 lb 12.8 oz (78.4 kg)   SpO2 98%   BMI 29.66 kg/m    Wt Readings from Last 3 Encounters:  05/27/24 172 lb 12.8 oz (78.4 kg)  05/13/24 171 lb (77.6 kg)  05/12/24 170 lb 9.6 oz (77.4 kg)     GEN: Well nourished, well developed female appearing in no acute distress NECK: No JVD; No carotid bruits CARDIAC: RRR with occasional ectopic beats, no murmurs, rubs, gallops RESPIRATORY:  Clear to auscultation without rales, wheezing or rhonchi  ABDOMEN: Appears non-distended. No  obvious abdominal masses. EXTREMITIES: No clubbing or cyanosis. No pitting edema.  Distal pedal pulses are 2+ bilaterally.   Assessment and Plan:   1. Frequent PVCs -Recent monitor showed predominantly normal sinus rhythm with an average heart rate of 58 bpm and she did have brief episodes of SVT with the longest lasting for 14.7 seconds and a 6.1% PVC burden. Symptoms while wearing the monitor correlated with normal sinus rhythm. - Unable to further titrate Lopressor  given her heart rate in the 50's. Continue Lopressor  25 mg twice daily for now. I encouraged her to keep track on her calendar of her episodes of near syncope because if these increase in frequency or severity, we may need to consider a repeat monitor to assess for significant pauses or bradycardia. Importance of adequate hydration was reviewed.   2. DOE (dyspnea on exertion) - Symptoms have been stable for several years with no acute changes. Prior cardiac workup has been reassuring with NST in 08/2022 showing no evidence of ischemia and echocardiogram in 02/2024 showed a preserved EF of 60 to 65% with no regional wall motion abnormalities and only trivial MR.  Remains on Lasix  20 mg daily and labs in 04/2024 showed her creatinine was stable at 0.98 with K+ of 4.6.  3. Other hyperlipidemia - FLP in 12/2023 showed total cholesterol 139, triglycerides 127 and LDL 67. Continue Atorvastatin  10mg  daily.   4. Hypothyroidism, unspecified type - Followed by her PCP. TSH was stable at 0.43 when checked in 04/2024. Remains on Synthroid  88 mcg daily.   Signed, Laymon CHRISTELLA Qua, PA-C

## 2024-05-27 ENCOUNTER — Encounter: Payer: Self-pay | Admitting: Student

## 2024-05-27 ENCOUNTER — Ambulatory Visit: Attending: Student | Admitting: Student

## 2024-05-27 VITALS — BP 124/70 | HR 50 | Ht 64.0 in | Wt 172.8 lb

## 2024-05-27 DIAGNOSIS — E7849 Other hyperlipidemia: Secondary | ICD-10-CM

## 2024-05-27 DIAGNOSIS — R0609 Other forms of dyspnea: Secondary | ICD-10-CM

## 2024-05-27 DIAGNOSIS — I493 Ventricular premature depolarization: Secondary | ICD-10-CM

## 2024-05-27 DIAGNOSIS — E039 Hypothyroidism, unspecified: Secondary | ICD-10-CM | POA: Diagnosis not present

## 2024-05-27 NOTE — Patient Instructions (Signed)
 Medication Instructions:  Your physician recommends that you continue on your current medications as directed. Please refer to the Current Medication list given to you today.  *If you need a refill on your cardiac medications before your next appointment, please call your pharmacy*  Lab Work: NONE   If you have labs (blood work) drawn today and your tests are completely normal, you will receive your results only by: MyChart Message (if you have MyChart) OR A paper copy in the mail If you have any lab test that is abnormal or we need to change your treatment, we will call you to review the results.  Testing/Procedures: NONE   Follow-Up: At [facility], you and your health needs are our priority.  As part of our continuing mission to provide you with exceptional heart care, our providers are all part of one team.  This team includes your primary Cardiologist (physician) and Advanced Practice Providers or APPs (Physician Assistants and Nurse Practitioners) who all work together to provide you with the care you need, when you need it.  Your next appointment:   6 month(s)  Provider:   You may see Vishnu P Mallipeddi, MD or one of the following Advanced Practice Providers on your designated Care Team:   [place], PA-C  Scotesia Heflin, [place] Theotis Flake, [place]     We recommend signing up for the patient portal called "MyChart".  Sign up information is provided on this After Visit Summary.  MyChart is used to connect with patients for Virtual Visits (Telemedicine).  Patients are able to view lab/test results, encounter notes, upcoming appointments, etc.  Non-urgent messages can be sent to your provider as well.   To learn more about what you can do with MyChart, go to [email or web address].   Other Instructions Thank you for choosing [facility]!

## 2024-05-28 ENCOUNTER — Encounter: Payer: Self-pay | Admitting: Student

## 2024-06-10 ENCOUNTER — Encounter

## 2024-06-10 DIAGNOSIS — G4719 Other hypersomnia: Secondary | ICD-10-CM

## 2024-06-17 ENCOUNTER — Other Ambulatory Visit: Payer: Self-pay | Admitting: Family Medicine

## 2024-06-17 DIAGNOSIS — F419 Anxiety disorder, unspecified: Secondary | ICD-10-CM

## 2024-06-17 NOTE — Telephone Encounter (Signed)
 Requesting: lorazepam  1mg   Contract: 01/22/24 UDS: 01/22/24 Last Visit: 05/12/24 Next Visit: None Last Refill: 05/15/24 #90 and 0RF   Please Advise

## 2024-06-17 NOTE — Telephone Encounter (Signed)
 Requesting: Ativan  1 mg Contract: 01/22/2024 UDS: 01/22/2024 Last Visit: 05/12/2024 Next Visit: not scheduled Last Refill: 05/15/2024 #90 no refills  Please Advise

## 2024-06-22 DIAGNOSIS — G4730 Sleep apnea, unspecified: Secondary | ICD-10-CM | POA: Diagnosis not present

## 2024-06-22 DIAGNOSIS — G4733 Obstructive sleep apnea (adult) (pediatric): Secondary | ICD-10-CM | POA: Diagnosis not present

## 2024-07-02 ENCOUNTER — Ambulatory Visit (HOSPITAL_BASED_OUTPATIENT_CLINIC_OR_DEPARTMENT_OTHER)
Admission: RE | Admit: 2024-07-02 | Discharge: 2024-07-02 | Disposition: A | Discharge status: Home / Self Care | Source: Ambulatory Visit | Attending: Family Medicine | Admitting: Family Medicine

## 2024-07-02 ENCOUNTER — Ambulatory Visit: Admitting: Family Medicine

## 2024-07-02 VITALS — BP 124/60 | HR 55 | Ht 64.0 in | Wt 173.0 lb

## 2024-07-02 DIAGNOSIS — M25551 Pain in right hip: Secondary | ICD-10-CM

## 2024-07-02 DIAGNOSIS — M5441 Lumbago with sciatica, right side: Secondary | ICD-10-CM | POA: Diagnosis present

## 2024-07-02 DIAGNOSIS — G8929 Other chronic pain: Secondary | ICD-10-CM

## 2024-07-02 DIAGNOSIS — F411 Generalized anxiety disorder: Secondary | ICD-10-CM

## 2024-07-02 NOTE — Progress Notes (Signed)
 "  Acute Office Visit  Subjective:  Patient ID: Julie Park, female    DOB: October 10, 1933  Age: 89 y.o. MRN: [id number]  CC:  Chief Complaint  Patient presents with   Hip Pain      HPI Julie Park is here for right hip pain diffuse right leg.   Discussed the use of AI scribe software for clinical note transcription with the patient, who gave verbal consent to proceed.  History of Present Illness Julie Park Julie Park is an 89 year old female with moderate hip arthritis who presents with worsening sciatica pain.  She experiences worsening sciatica pain, described as a shooting pain radiating from her back down the side of her right leg to her knee. The pain is exacerbated by activities such as standing up, going upstairs, and driving her CRV, which requires lifting her leg. It feels like an 'electrical shock' and is rated as a 6 out of 10 on bad days, particularly after extended activity. She finds it difficult to lift her leg onto the bed or couch without using her hands.  She has a history of moderate arthritis in her hip, and an x-ray was performed in September 2024. She is uncertain if her current symptoms are due to her hip, sciatica, or back issues.   She takes gabapentin  for pain management. She does not use ibuprofen  due to stomach upset but tolerates acetaminophen  well.  She lives on a lower level and experiences increased pain when spending extended periods upstairs doing chores. She tries to stay active but needs frequent breaks due to back pain.  No current pain today but the pain can reach a 6 out of 10 on bad days. She experiences difficulty sleeping, often waking up after using the bathroom and struggling to return to sleep. She has been feeling more depressed and overwhelmed, particularly around the holidays. Reports she gets anxious easily especially over world events and her family, specifically her young grandchildren.         Past Medical History:   Diagnosis Date   Anxiety    Chest pain 02/08/15   ETT normal   Diverticulosis    Diverticulitis (1982)   Fracture of rib of right side 08/16/2013   GERD (gastroesophageal reflux disease)    Hyperlipidemia, mixed    Hypothyroidism    IBS (irritable bowel syndrome)    Morton's neuroma    Right lumbar radiculopathy    Intermittent x 2 episodes in summer 2014   Shingles 1967 and 2010   Ulcer    Urine incontinence    Vertigo     Past Surgical History:  Procedure Laterality Date   CARDIOVASCULAR STRESS TEST  02/08/15   ETT normal   CATARACT EXTRACTION  2012   Dr. Cleatus   COLONOSCOPY  2005   Dr. Myriam at [facility] (normal per pt report)   DEXA  11/03/13   Heel T score -0.8.   FOOT NEUROMA SURGERY  1994   right foot   TONSILLECTOMY     TONSILLECTOMY AND ADENOIDECTOMY  1948   Dr. Neomia Benne [facility]   TUBAL LIGATION  1974   WISDOM TOOTH EXTRACTION      Family History  Problem Relation Age of Onset   Tuberculosis Mother    Pancreatic cancer Father    Asthma Daughter    Heart attack Paternal Grandfather     Social History   Socioeconomic History   Marital status: Single    Spouse name: Not  on file   Number of children: Not on file   Years of education: Not on file   Highest education level: Bachelor's degree (e.g., BA, AB, BS)  Occupational History   Occupation: Retired  Tobacco Use   Smoking status: Former    Current packs/day: 1.00    Average packs/day: 1 pack/day for 41.0 years (41.0 ttl pk-yrs)    Types: Cigarettes    Start date: 06/26/1983   Smokeless tobacco: Never  Vaping Use   Vaping status: Never Used  Substance and Sexual Activity   Alcohol use: No   Drug use: No   Sexual activity: Not Currently  Other Topics Concern   Not on file  Social History Narrative   Divorced, 2 children.   Lives in [place].   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   Tobacco: small amount,  distant past.  Alcohol: none.           Social History      Diet? n/a      Do you drink/eat things with caffeine? coffee      Marital status?     Divorced (1984)                              What year were you married? 1958      Do you live in a house, apartment, assisted living, condo, trailer, etc.? house      Is it one or more stories?  yes      How many persons live in your home? Living with daughter and husband at the present      Do you have any pets in your home? (please list) no       Highest level of education completed? 16 years      Current or past profession: teacher grades 1 and 2, education coordinator [employer]       Do you exercise?                   yes                   Type & how often? Walk 1-3 times per week      Advanced Directives      Do you have a living will? yes      Do you have a DNR form?                                  If not, do you want to discuss one?      Do you have signed POA/HPOA for forms? yes      Functional Status      Do you have difficulty bathing or dressing yourself?      Do you have difficulty preparing food or eating?       Do you have difficulty managing your medications?      Do you have difficulty managing your finances?      Do you have difficulty affording your medications?      Social Drivers of Health   Tobacco Use: Medium Risk (07/02/2024)   Patient History    Smoking Tobacco Use: Former    Smokeless Tobacco Use: Never    Passive Exposure: Not on file  Financial Resource Strain: Low Risk (07/02/2024)  Overall Financial Resource Strain (CARDIA)    Difficulty of Paying Living Expenses: Not hard at all  Food Insecurity: No Food Insecurity (07/02/2024)   Epic    Worried About Programme Researcher, Broadcasting/film/video in the Last Year: Never true    Ran Out of Food in the Last Year: Never true  Transportation Needs: No Transportation Needs (07/02/2024)   Epic    Lack of Transportation (Medical): No    Lack of Transportation  (Non-Medical): No  Physical Activity: Inactive (09/27/2022)   Exercise Vital Sign    Days of Exercise per Week: 0 days    Minutes of Exercise per Session: 0 min  Stress: Stress Concern Present (07/02/2024)   [facility] [employer] - Occupational Stress Questionnaire    Feeling of Stress: To some extent  Social Connections: Unknown (07/02/2024)   Social Connection and Isolation Panel    Frequency of Communication with Friends and Family: Not on file    Frequency of Social Gatherings with Friends and Family: Not on file    Attends Religious Services: Not on file    Active Member of Clubs or Organizations: No    Attends Banker Meetings: Not on file    Marital Status: Not on file  Intimate Partner Violence: Not At Risk (09/27/2022)   Humiliation, Afraid, Rape, and Kick questionnaire    Fear of Current or Ex-Partner: No    Emotionally Abused: No    Physically Abused: No    Sexually Abused: No  Depression (PHQ2-9): Medium Risk (07/02/2024)   Depression (PHQ2-9)    PHQ-2 Score: 6  Alcohol Screen: Low Risk (09/27/2022)   Alcohol Screen    Last Alcohol Screening Score (AUDIT): 0  Housing: Unknown (07/02/2024)   Epic    Unable to Pay for Housing in the Last Year: No    Number of Times Moved in the Last Year: Not on file    Homeless in the Last Year: No  Utilities: Not At Risk (09/27/2022)   AHC Utilities    Threatened with loss of utilities: No  Health Literacy: Not on file    ROS All ROS negative except what is listed in the HPI.   Objective:   Today's Vitals: BP 124/60   Pulse (!) 55   Ht 5' 4 (1.626 m)   Wt 173 lb (78.5 kg)   SpO2 97%   BMI 29.70 kg/m   Physical Exam Vitals reviewed.  Constitutional:      Appearance: Normal appearance.  Musculoskeletal:        General: No swelling.     Comments: Right lumbar paraspinal muscles are tight to palpation, gait is baseline, but slow changing positions from sitting to standing due to discomfort   Skin:     General: Skin is warm and dry.  Neurological:     Mental Status: She is alert and oriented to person, place, and time.  Psychiatric:        Mood and Affect: Mood normal.        Behavior: Behavior normal.        Thought Content: Thought content normal.        Judgment: Judgment normal.          Assessment & Plan:   Problem List Items Addressed This Visit       Active Problems   GAD (generalized anxiety disorder)   Chronic low back pain - Primary   Relevant Orders   DG Lumbar Spine 2-3 Views   Ambulatory  referral to Sports Medicine   Other Visit Diagnoses       Pain of right hip       Relevant Orders   DG HIP UNILAT W OR W/O PELVIS 2-3 VIEWS RIGHT   Ambulatory referral to Sports Medicine       Assessment & Plan Chronic right-sided low back pain with right-sided sciatica and right hip osteoarthritis Chronic low back pain with sciatica and hip osteoarthritis. Pain exacerbated by activity, consistent with sciatica and arthritis. Gabapentin  used for nerve pain. Ibuprofen  causes stomach upset, acetaminophen  tolerated. - Ordered updated x-rays of low back and hip to assess arthritis progression and rule out other causes. - Referred to sports medicine for potential MRI and injections. - Provided home stretches to alleviate symptoms. - Discussed physical therapy options, she will consider. - Advised on activity modification to prevent symptom exacerbation.  Anxiety Increased anxiety during winter and holidays with sleep disturbances. Currently on lorazepam , reduced to two doses per day. Discussed non-pharmacological interventions and potential counseling. - Encouraged use of prayer, scripture, and audio books for relaxation. - Advised on maintaining a bedtime routine and reducing iPad use before bed. - Discussed potential use of professional counseling and medication adjustments.      Follow-up: Return if symptoms worsen or fail to improve.   Waddell FURY Almarie, DNP,  FNP-C  I,Emily Lagle,acting as a neurosurgeon for Waddell KATHEE Almarie, NP.,have documented all relevant documentation on the behalf of Waddell KATHEE Almarie, NP.   I, Waddell KATHEE Almarie, NP, have reviewed all documentation for this visit. The documentation on 07/02/2024 for the exam, diagnosis, procedures, and orders are all accurate and complete. "

## 2024-07-08 ENCOUNTER — Ambulatory Visit (HOSPITAL_BASED_OUTPATIENT_CLINIC_OR_DEPARTMENT_OTHER)

## 2024-07-08 ENCOUNTER — Encounter (HOSPITAL_BASED_OUTPATIENT_CLINIC_OR_DEPARTMENT_OTHER): Payer: Self-pay

## 2024-07-08 VITALS — BP 138/60 | HR 50 | Ht 64.0 in | Wt 174.2 lb

## 2024-07-08 DIAGNOSIS — G4733 Obstructive sleep apnea (adult) (pediatric): Secondary | ICD-10-CM

## 2024-07-08 DIAGNOSIS — Z87891 Personal history of nicotine dependence: Secondary | ICD-10-CM

## 2024-07-08 NOTE — Patient Instructions (Signed)
 Start CPAP therapy; wear the machine nightly with goal of 4-6 hours of usage nightly or more.  Follow up in 6-8 weeks for compliance download review.

## 2024-07-08 NOTE — Progress Notes (Signed)
 "  @Patient  ID: Julie Park, female    DOB: [birth year], 89 y.o.   MRN: [id number]  Chief Complaint  Patient presents with   Establish Care    Excessive daytime sleepiness    Referring provider: Almarie Waddell NOVAK, NP  HPI: Discussed the use of AI scribe software for clinical note transcription with the patient, who gave verbal consent to proceed.  History of Present Illness Julie Park is an 89 year old female who presents for follow-up after a sleep study.  She underwent a sleep study which confirmed mild sleep apnea, with an apnea-hypopnea index (AHI) of 11.3 events per hour and a low oxygen saturation of 77% during the study.  She experiences difficulty sleeping, often feeling as though she did not sleep at all. She frequently experiences snorting and waking up with a choking sensation, which she describes as feeling like she is being smothered. This sensation is often accompanied by a need to gasp for air upon waking.  Her daughter notes that she sometimes describes feeling like she is being smothered. She has been experiencing these symptoms for an unspecified duration, impacting her ability to achieve restful sleep.  Last OV 05/13/2024: Julie Park is an 89 year old female who presents with sleep disturbances. She is accompanied by her daughter, [name]. She was referred by her primary care provider, Waddell Almarie, for evaluation of sleep apnea.   She has a long-standing history of snoring and episodes of apnea during sleep, as observed by her daughter. She experiences frequent awakenings throughout the night, often on the hour, and wakes up feeling unrefreshed. Her typical sleep schedule is from 9-10 PM to 7-9 AM, but she had a restless night prior to this visit due to anxiety about the appointment. She also experiences nocturnal dry mouth, necessitating water at her bedside.   She has not undergone a sleep study and has not been formally diagnosed with sleep apnea.  She does wake herself up with snorting and 'mouth sounds.'   Her past medical history includes hypertension and premature ventricular contractions (PVCs), for which she is treated with Lasix  and metoprolol . She notes improvement in her heart-related symptoms since starting these medications. A recent heart monitor test did not show significant findings. No history of heart attack or stroke.   Regarding her daily activities, she does not typically fall asleep during the day, even when reading or watching TV. However, she acknowledges that she might fall asleep if lying down in the afternoon, although she rarely gives herself the opportunity to nap.    TEST/EVENTS : HST 06/23/2024:  mild OSA AHI 11.3/hr  Allergies[1]  Immunization History  Administered Date(s) Administered   Fluad  Quad(high Dose 65+) 04/13/2021   Fluad  Trivalent(High Dose 65+) 04/03/2023   Fluzone  Influenza virus vaccine,trivalent (IIV3), split virus 04/07/2004, 04/09/2005, 03/29/2006, 03/27/2007, 03/11/2008, 03/03/2009, 02/23/2010, 03/19/2011, 03/06/2012, 03/04/2013, 03/04/2014   Hep B, Unspecified 03/29/1992, 04/26/1992, 10/04/1992   INFLUENZA, HIGH DOSE SEASONAL PF 02/23/2013, 03/09/2016, 03/25/2017, 04/29/2018, 04/24/2019, 04/11/2022, 03/07/2024   Influenza Split 02/24/2012   Influenza,inj,Quad PF,6+ Mos 02/23/2013   Influenza,inj,quad, With Preservative 03/08/2015   Influenza-Unspecified 05/21/1994, 05/10/1995, 04/16/1996, 04/15/1997, 05/15/1999, 04/08/2001, 03/30/2002, 03/30/2003, 02/24/2012, 03/08/2015, 03/26/2017, 05/14/2020   Novel Infuenza-h1n1-09 07/08/2008   PFIZER(Purple Top)SARS-COV-2 Vaccination 07/15/2019, 08/04/2019, 08/14/2019, 04/14/2020   Pfizer Covid-19 Vaccine Bivalent Booster 75yrs & up 05/10/2021   Pfizer(Comirnaty )Fall Seasonal Vaccine 12 years and older 03/23/2022, 04/03/2023   Pneumococcal Conjugate,unspecified 03/25/1996, 03/20/2013   Pneumococcal Conjugate-13 03/25/1996, 03/20/2013  Pneumococcal Polysaccharide-23 04/16/1996, 03/20/2013   Pneumococcal-Unspecified 03/20/2013   Td 12/20/2018   Tdap 09/17/2013   Zoster, Live 08/23/2012    Past Medical History:  Diagnosis Date   Anxiety    Chest pain 02/08/15   ETT normal   Diverticulosis    Diverticulitis (1982)   Fracture of rib of right side 08/16/2013   GERD (gastroesophageal reflux disease)    Hyperlipidemia, mixed    Hypothyroidism    IBS (irritable bowel syndrome)    Morton's neuroma    Right lumbar radiculopathy    Intermittent x 2 episodes in summer 2014   Shingles 1967 and 2010   Ulcer    Urine incontinence    Vertigo     Tobacco History: Tobacco Use History[2] Counseling given: Not Answered   Outpatient Medications Prior to Visit  Medication Sig Dispense Refill   acetaminophen  (TYLENOL ) 325 MG tablet Take 2 tablets (650 mg total) by mouth every 6 (six) hours as needed. 30 tablet 0   aspirin  81 MG tablet Take 81 mg by mouth every morning.      atorvastatin  (LIPITOR) 10 MG tablet Take 1 tablet (10 mg total) by mouth daily. 90 tablet 1   Fiber POWD Take by mouth.     fluticasone  (FLONASE ) 50 MCG/ACT nasal spray SPRAY 2 SPRAYS INTO EACH NOSTRIL EVERY DAY 48 mL 3   furosemide  (LASIX ) 20 MG tablet Take 1 tablet (20 mg total) by mouth daily. 90 tablet 3   gabapentin  (NEURONTIN ) 300 MG capsule Take 2 capsules (600 mg total) by mouth 3 (three) times daily. (Patient taking differently: Take 600 mg by mouth 2 (two) times daily.) 180 capsule 11   levothyroxine  (SYNTHROID ) 88 MCG tablet Take 1 tablet (88 mcg total) by mouth daily. 90 tablet 1   loratadine  (CLARITIN ) 10 MG tablet TAKE 1 TABLET BY MOUTH EVERY DAY 90 tablet 3   LORazepam  (ATIVAN ) 1 MG tablet TAKE 1 TABLET BY MOUTH EVERY 8 HOURS AS NEEDED FOR ANXIETY 90 tablet 0   Magnesium  250 MG TABS Take 2 tablets by mouth daily.      metoprolol  tartrate (LOPRESSOR ) 25 MG tablet Take 1 tablet (25 mg total) by mouth 2 (two) times daily. 180 tablet 3    nitroGLYCERIN  (NITROSTAT ) 0.4 MG SL tablet PLACE 1 TABLET (0.4 MG TOTAL) UNDER THE TONGUE EVERY 5 (FIVE) MINUTES X 3 DOSES AS NEEDED FOR CHEST PAIN (IF NO RELIEF AFTER 3RD DOSE, PROCEED TO ED OR CALL 911). 12 tablet 0   nystatin  cream (MYCOSTATIN ) APPLY 1 APPLICATION TOPICALLY AS NEEDED FOR DRY SKIN. 30 g 3   omeprazole  (PRILOSEC) 40 MG capsule Take 1 capsule (40 mg total) by mouth daily. 90 capsule 1   Propylene Glycol (SYSTANE BALANCE) 0.6 % SOLN Apply to eye daily as needed.     sodium chloride  (OCEAN) 0.65 % nasal spray Place 1 spray into the nose as needed for congestion. 30 mL 12   Facility-Administered Medications Prior to Visit  Medication Dose Route Frequency Provider Last Rate Last Admin   denosumab  (PROLIA ) injection 60 mg  60 mg Subcutaneous Q6 months [place], Amy E, NP         Review of Systems: as per hpi  Constitutional:   No  weight loss, night sweats,  Fevers, chills, fatigue, or  lassitude.  HEENT:   No headaches,  Difficulty swallowing,  Tooth/dental problems, or  Sore throat,                No sneezing,  itching, ear ache, nasal congestion, post nasal drip,   CV:  No chest pain,  Orthopnea, PND, swelling in lower extremities, anasarca, dizziness, palpitations, syncope.   GI  No heartburn, indigestion, abdominal pain, nausea, vomiting, diarrhea, change in bowel habits, loss of appetite, bloody stools.   Resp: No shortness of breath with exertion or at rest.  No excess mucus, no productive cough,  No non-productive cough,  No coughing up of blood.  No change in color of mucus.  No wheezing.  No chest wall deformity  Skin: no rash or lesions.  GU: no dysuria, change in color of urine, no urgency or frequency.  No flank pain, no hematuria   MS:  No joint pain or swelling.  No decreased range of motion.  No back pain.    Physical Exam  BP 138/60   Pulse (!) 50   Ht 5' 4 (1.626 m)   Wt 174 lb 3.2 oz (79 kg)   SpO2 96%   BMI 29.90 kg/m   GEN: A/Ox3; pleasant ,  NAD, well nourished    HEENT:  [place]/AT,  EACs-clear, TMs-wnl, NOSE-clear, THROAT-clear, no lesions, no postnasal drip or exudate noted. Mallampati 4  NECK:  Supple w/ fair ROM; no JVD; normal carotid impulses w/o bruits; no thyromegaly or nodules palpated; no lymphadenopathy.    RESP  Clear  P & A; w/o, wheezes/ rales/ or rhonchi. no accessory muscle use, no dullness to percussion  CARD:  RRR, no m/r/g, no peripheral edema, pulses intact, no cyanosis or clubbing.  GI:   Soft & nt; nml bowel sounds; no organomegaly or masses detected.   Musco: Warm bil, no deformities or joint swelling noted.   Neuro: alert, no focal deficits noted.    Skin: Warm, no lesions or rashes    Lab Results:  CBC    Component Value Date/Time   WBC 4.7 01/22/2024 1125   RBC 4.68 01/22/2024 1125   HGB 13.8 01/22/2024 1125   HCT 42.3 01/22/2024 1125   PLT 235.0 01/22/2024 1125   MCV 90.3 01/22/2024 1125   MCH 28.6 02/06/2023 0934   MCHC 32.6 01/22/2024 1125   RDW 14.2 01/22/2024 1125   LYMPHSABS 1.4 01/22/2024 1125   MONOABS 0.6 01/22/2024 1125   EOSABS 0.1 01/22/2024 1125   BASOSABS 0.0 01/22/2024 1125    BMET    Component Value Date/Time   NA 136 05/12/2024 1314   K 4.6 05/12/2024 1314   CL 101 05/12/2024 1314   CO2 28 05/12/2024 1314   GLUCOSE 109 (H) 05/12/2024 1314   BUN 17 05/12/2024 1314   CREATININE 0.98 05/12/2024 1314   CREATININE 0.76 12/23/2023 1508   CALCIUM  9.1 05/12/2024 1314   GFRNONAA 72 08/15/2020 1236   GFRAA 83 08/15/2020 1236    BNP    Component Value Date/Time   BNP 107.0 (H) 02/21/2024 1248   BNP 43 10/05/2021 1511    ProBNP No results found for: PROBNP  Imaging: No results found.  Administration History     None           No data to display          No results found for: NITRICOXIDE   Assessment & Plan:   Assessment & Plan OSA (obstructive sleep apnea)  Assessment and Plan Assessment & Plan Obstructive sleep apnea Mild  obstructive sleep apnea with AHI of 11.3 and significant hypoxemia. Discussed cardiovascular risks of untreated OSA and potential treatment options. She opted for CPAP therapy. - Ordered  CPAP machine through DME company. - Scheduled follow-up in 6-8 weeks for compliance and efficacy assessment. - Provided after-visit summary on CPAP use and maintenance.    Return in about 7 weeks (around 08/26/2024) for compliance download.  Candis Dandy, PA-C 07/08/2024      [1] No Known Allergies [2]  Social History Tobacco Use  Smoking Status Former   Current packs/day: 1.00   Average packs/day: 1 pack/day for 41.0 years (41.0 ttl pk-yrs)   Types: Cigarettes   Start date: 06/26/1983  Smokeless Tobacco Never   "

## 2024-07-10 ENCOUNTER — Ambulatory Visit: Payer: Self-pay | Admitting: Family Medicine

## 2024-07-10 DIAGNOSIS — R0981 Nasal congestion: Secondary | ICD-10-CM

## 2024-07-10 MED ORDER — FLUTICASONE PROPIONATE 50 MCG/ACT NA SUSP: 2.0000 | Freq: Every day | NASAL | 0 refills | Status: AC

## 2024-07-12 ENCOUNTER — Other Ambulatory Visit: Payer: Self-pay | Admitting: Family

## 2024-07-15 ENCOUNTER — Telehealth: Payer: Self-pay | Admitting: Neurology

## 2024-07-15 MED ORDER — GABAPENTIN 300 MG PO CAPS: 600.0000 mg | ORAL_CAPSULE | Freq: Three times a day (TID) | ORAL | 2 refills | Status: AC

## 2024-07-15 NOTE — Telephone Encounter (Addendum)
 Last seen on 03/12/23 Follow up scheduled on 09/15/24  Patient informed Rx sent.

## 2024-07-15 NOTE — Telephone Encounter (Signed)
 Pt called stating that she will be running out of her gabapentin  (NEURONTIN ) 300 MG capsule this weekend and does not have any to last her to her scheduled appt time. Can a refill be sent to the [facility] in [place].

## 2024-07-24 ENCOUNTER — Telehealth (HOSPITAL_BASED_OUTPATIENT_CLINIC_OR_DEPARTMENT_OTHER): Payer: Self-pay | Admitting: *Deleted

## 2024-07-24 NOTE — Telephone Encounter (Signed)
 CMN received for CPAP supplies signed by provider and faxed confirmation received

## 2024-07-27 ENCOUNTER — Telehealth: Payer: Self-pay | Admitting: Internal Medicine

## 2024-07-27 MED ORDER — NITROGLYCERIN 0.4 MG SL SUBL: 0.4000 mg | SUBLINGUAL_TABLET | SUBLINGUAL | 1 refills | Status: AC | PRN

## 2024-07-27 NOTE — Telephone Encounter (Signed)
 Patient c/o Palpitations: STAT if patient c/o lightheadedness, shortness of breath, or chest pain  How long have you had palpitations/irregular HR/ Afib? Are you having the symptoms now? 1 month   Are you currently experiencing lightheadedness, SOB or CP? No   Do you have a history of afib (atrial fibrillation) or irregular heart rhythm? No   Have you checked your BP or HR? (document readings if available): checks it sometimes but not often   Are you experiencing any other symptoms? Dizziness, flushing feeling, jaw and neck pain, has SOB during her episodes during palpitation episodes that last around 5-10 minutes

## 2024-07-27 NOTE — Telephone Encounter (Signed)
 In speaking with Mrs.Red, she reports having neck and jaw pain along with her dizziness which she reports is chronic. She unfortunately has not really kept a record of such occurrences but does recall these symptoms happened twice last week.She states these are not palpitations.  She took 1 NTG, felt a warm sensation throughout her body and symptoms were almost immediately resolved. She describes after the flushing sensation she feels like she is going to go out. We talked about how SL NTG can give you a flushing sensation and to always take it when sitting down.I also encouraged her to take her BP/HR.  She tells me she is drinking a lot of water but does sometimes skip lunch because she eats breakfast late. I encouraged her to not skip meals and have something like ensure available. She says she has some boost. She is not sure if she has ever been seen by ENT for dizziness as she sees so many doctors. She does have an appointment for C-Pap titration this week.  At last office visit in December there was mention of wearing a cardiac monitor again.I will forward to provider for review.

## 2024-07-27 NOTE — Telephone Encounter (Signed)
" ° ° °  If symptoms feels different from her prior palpitations, I am not sure we can accredit them to her PVC's. If symptoms have been resolving with NTG, I am concerned her jaw and neck pain could represent angina. She did have a stress test in the past but this was almost 2 years ago. If she is in agreement, I would recommend arranging for a repeat Lexiscan  Myoview  and an office visit afterwards. If she wants to review symptoms at an office visit prior to arranging a repeat stress test, that is fine as well. Please review ER precautions regarding SL NTG and what would warrant ED evaluation in the interim.    Signed, Laymon CHRISTELLA Qua, PA-C 07/27/2024, 8:08 PM Pager: [phone]  "

## 2024-07-28 ENCOUNTER — Ambulatory Visit: Admitting: Orthopedic Surgery

## 2024-07-28 NOTE — Telephone Encounter (Signed)
 Patient returned staff call.

## 2024-08-11 ENCOUNTER — Ambulatory Visit: Admitting: Orthopedic Surgery

## 2024-08-12 ENCOUNTER — Ambulatory Visit: Admitting: Family Medicine

## 2024-09-01 ENCOUNTER — Ambulatory Visit (HOSPITAL_BASED_OUTPATIENT_CLINIC_OR_DEPARTMENT_OTHER)

## 2024-09-15 ENCOUNTER — Ambulatory Visit: Admitting: Neurology
